# Patient Record
Sex: Female | Born: 1945 | Race: White | Hispanic: No | Smoking: Never smoker
Health system: Southern US, Community
[De-identification: ages and names within clinical notes are randomized; demographics above are authoritative.]

## PROBLEM LIST (undated history)

## (undated) DIAGNOSIS — F419 Anxiety disorder, unspecified: Secondary | ICD-10-CM

## (undated) DIAGNOSIS — D696 Thrombocytopenia, unspecified: Secondary | ICD-10-CM

## (undated) DIAGNOSIS — M8580 Other specified disorders of bone density and structure, unspecified site: Secondary | ICD-10-CM

## (undated) DIAGNOSIS — D649 Anemia, unspecified: Secondary | ICD-10-CM

## (undated) DIAGNOSIS — K219 Gastro-esophageal reflux disease without esophagitis: Secondary | ICD-10-CM

## (undated) DIAGNOSIS — N189 Chronic kidney disease, unspecified: Secondary | ICD-10-CM

## (undated) DIAGNOSIS — M1990 Unspecified osteoarthritis, unspecified site: Secondary | ICD-10-CM

## (undated) DIAGNOSIS — I10 Essential (primary) hypertension: Secondary | ICD-10-CM

## (undated) DIAGNOSIS — F429 Obsessive-compulsive disorder, unspecified: Secondary | ICD-10-CM

## (undated) DIAGNOSIS — F32A Depression, unspecified: Secondary | ICD-10-CM

## (undated) DIAGNOSIS — R413 Other amnesia: Secondary | ICD-10-CM

## (undated) DIAGNOSIS — E11319 Type 2 diabetes mellitus with unspecified diabetic retinopathy without macular edema: Secondary | ICD-10-CM

## (undated) DIAGNOSIS — F329 Major depressive disorder, single episode, unspecified: Secondary | ICD-10-CM

## (undated) HISTORY — DX: Thrombocytopenia, unspecified: D69.6

## (undated) HISTORY — DX: Major depressive disorder, single episode, unspecified: F32.9

## (undated) HISTORY — PX: BREAST EXCISIONAL BIOPSY: SUR124

## (undated) HISTORY — DX: Essential (primary) hypertension: I10

## (undated) HISTORY — DX: Other amnesia: R41.3

## (undated) HISTORY — PX: APPENDECTOMY: SHX54

## (undated) HISTORY — DX: Chronic kidney disease, unspecified: N18.9

## (undated) HISTORY — DX: Unspecified osteoarthritis, unspecified site: M19.90

## (undated) HISTORY — PX: CATARACT EXTRACTION: SUR2

## (undated) HISTORY — DX: Type 2 diabetes mellitus with unspecified diabetic retinopathy without macular edema: E11.319

## (undated) HISTORY — DX: Anxiety disorder, unspecified: F41.9

## (undated) HISTORY — DX: Anemia, unspecified: D64.9

## (undated) HISTORY — DX: Other specified disorders of bone density and structure, unspecified site: M85.80

## (undated) HISTORY — DX: Obsessive-compulsive disorder, unspecified: F42.9

## (undated) HISTORY — DX: Depression, unspecified: F32.A

## (undated) HISTORY — DX: Gastro-esophageal reflux disease without esophagitis: K21.9

---

## 1979-07-04 HISTORY — PX: TUBAL LIGATION: SHX77

## 1998-02-10 ENCOUNTER — Other Ambulatory Visit: Admission: RE | Admit: 1998-02-10 | Discharge: 1998-02-10 | Payer: Self-pay | Admitting: Gynecology

## 1998-12-21 ENCOUNTER — Ambulatory Visit (HOSPITAL_COMMUNITY): Admission: RE | Admit: 1998-12-21 | Discharge: 1998-12-21 | Payer: Self-pay | Admitting: Ophthalmology

## 1999-02-09 ENCOUNTER — Other Ambulatory Visit: Admission: RE | Admit: 1999-02-09 | Discharge: 1999-02-09 | Payer: Self-pay | Admitting: Gynecology

## 1999-08-12 ENCOUNTER — Emergency Department (HOSPITAL_COMMUNITY): Admission: EM | Admit: 1999-08-12 | Discharge: 1999-08-12 | Payer: Self-pay | Admitting: Emergency Medicine

## 2000-02-13 ENCOUNTER — Other Ambulatory Visit: Admission: RE | Admit: 2000-02-13 | Discharge: 2000-02-13 | Payer: Self-pay | Admitting: Gynecology

## 2000-04-10 ENCOUNTER — Encounter: Payer: Self-pay | Admitting: Gynecology

## 2000-04-10 ENCOUNTER — Encounter: Admission: RE | Admit: 2000-04-10 | Discharge: 2000-04-10 | Payer: Self-pay | Admitting: Gynecology

## 2001-02-14 ENCOUNTER — Other Ambulatory Visit: Admission: RE | Admit: 2001-02-14 | Discharge: 2001-02-14 | Payer: Self-pay | Admitting: Gynecology

## 2001-04-18 ENCOUNTER — Encounter: Admission: RE | Admit: 2001-04-18 | Discharge: 2001-04-18 | Payer: Self-pay | Admitting: Gynecology

## 2001-04-18 ENCOUNTER — Encounter: Payer: Self-pay | Admitting: Gynecology

## 2002-02-17 ENCOUNTER — Other Ambulatory Visit: Admission: RE | Admit: 2002-02-17 | Discharge: 2002-02-17 | Payer: Self-pay | Admitting: Gynecology

## 2002-06-02 ENCOUNTER — Encounter: Payer: Self-pay | Admitting: Gynecology

## 2002-06-02 ENCOUNTER — Encounter: Admission: RE | Admit: 2002-06-02 | Discharge: 2002-06-02 | Payer: Self-pay | Admitting: Gynecology

## 2003-02-19 ENCOUNTER — Other Ambulatory Visit: Admission: RE | Admit: 2003-02-19 | Discharge: 2003-02-19 | Payer: Self-pay | Admitting: Gynecology

## 2003-06-15 ENCOUNTER — Encounter: Admission: RE | Admit: 2003-06-15 | Discharge: 2003-06-15 | Payer: Self-pay | Admitting: Gynecology

## 2004-03-03 ENCOUNTER — Ambulatory Visit (HOSPITAL_COMMUNITY): Admission: RE | Admit: 2004-03-03 | Discharge: 2004-03-03 | Payer: Self-pay | Admitting: Endocrinology

## 2004-03-10 ENCOUNTER — Other Ambulatory Visit: Admission: RE | Admit: 2004-03-10 | Discharge: 2004-03-10 | Payer: Self-pay | Admitting: Gynecology

## 2004-06-20 ENCOUNTER — Encounter: Admission: RE | Admit: 2004-06-20 | Discharge: 2004-06-20 | Payer: Self-pay | Admitting: Gynecology

## 2004-07-03 HISTORY — PX: RETINAL DETACHMENT SURGERY: SHX105

## 2005-02-14 ENCOUNTER — Ambulatory Visit (HOSPITAL_COMMUNITY): Admission: RE | Admit: 2005-02-14 | Discharge: 2005-02-14 | Payer: Self-pay | Admitting: Ophthalmology

## 2005-03-27 ENCOUNTER — Other Ambulatory Visit: Admission: RE | Admit: 2005-03-27 | Discharge: 2005-03-27 | Payer: Self-pay | Admitting: Gynecology

## 2005-07-27 ENCOUNTER — Encounter: Admission: RE | Admit: 2005-07-27 | Discharge: 2005-07-27 | Payer: Self-pay | Admitting: Gynecology

## 2006-03-29 ENCOUNTER — Other Ambulatory Visit: Admission: RE | Admit: 2006-03-29 | Discharge: 2006-03-29 | Payer: Self-pay | Admitting: Gynecology

## 2006-07-30 ENCOUNTER — Encounter: Admission: RE | Admit: 2006-07-30 | Discharge: 2006-07-30 | Payer: Self-pay | Admitting: Gynecology

## 2007-07-11 ENCOUNTER — Encounter: Admission: RE | Admit: 2007-07-11 | Discharge: 2007-07-11 | Payer: Self-pay | Admitting: Gynecology

## 2008-07-06 ENCOUNTER — Encounter: Admission: RE | Admit: 2008-07-06 | Discharge: 2008-07-06 | Payer: Self-pay | Admitting: Gynecology

## 2009-08-05 ENCOUNTER — Encounter: Admission: RE | Admit: 2009-08-05 | Discharge: 2009-08-05 | Payer: Self-pay | Admitting: Gynecology

## 2010-01-11 ENCOUNTER — Encounter: Payer: Self-pay | Admitting: Gastroenterology

## 2010-02-22 ENCOUNTER — Encounter: Payer: Self-pay | Admitting: Gastroenterology

## 2010-03-09 ENCOUNTER — Encounter (INDEPENDENT_AMBULATORY_CARE_PROVIDER_SITE_OTHER): Payer: Self-pay | Admitting: *Deleted

## 2010-03-10 ENCOUNTER — Encounter (HOSPITAL_COMMUNITY)
Admission: RE | Admit: 2010-03-10 | Discharge: 2010-06-08 | Payer: Self-pay | Source: Home / Self Care | Admitting: Nephrology

## 2010-03-13 ENCOUNTER — Encounter (INDEPENDENT_AMBULATORY_CARE_PROVIDER_SITE_OTHER): Payer: Self-pay | Admitting: *Deleted

## 2010-03-15 ENCOUNTER — Encounter
Admission: RE | Admit: 2010-03-15 | Discharge: 2010-03-15 | Payer: Self-pay | Source: Home / Self Care | Admitting: Nephrology

## 2010-04-07 ENCOUNTER — Encounter (INDEPENDENT_AMBULATORY_CARE_PROVIDER_SITE_OTHER): Payer: Self-pay | Admitting: *Deleted

## 2010-04-14 ENCOUNTER — Encounter (INDEPENDENT_AMBULATORY_CARE_PROVIDER_SITE_OTHER): Payer: Self-pay | Admitting: *Deleted

## 2010-04-19 ENCOUNTER — Ambulatory Visit: Payer: Self-pay | Admitting: Gastroenterology

## 2010-04-19 DIAGNOSIS — D509 Iron deficiency anemia, unspecified: Secondary | ICD-10-CM

## 2010-04-19 DIAGNOSIS — IMO0001 Reserved for inherently not codable concepts without codable children: Secondary | ICD-10-CM | POA: Insufficient documentation

## 2010-04-19 DIAGNOSIS — N189 Chronic kidney disease, unspecified: Secondary | ICD-10-CM

## 2010-04-19 DIAGNOSIS — E109 Type 1 diabetes mellitus without complications: Secondary | ICD-10-CM

## 2010-05-24 ENCOUNTER — Ambulatory Visit: Payer: Self-pay | Admitting: Gastroenterology

## 2010-05-25 ENCOUNTER — Telehealth: Payer: Self-pay | Admitting: Gastroenterology

## 2010-05-30 ENCOUNTER — Encounter: Payer: Self-pay | Admitting: Gastroenterology

## 2010-06-03 ENCOUNTER — Encounter (INDEPENDENT_AMBULATORY_CARE_PROVIDER_SITE_OTHER): Payer: Self-pay | Admitting: *Deleted

## 2010-06-06 ENCOUNTER — Ambulatory Visit: Payer: Self-pay | Admitting: Gastroenterology

## 2010-06-10 ENCOUNTER — Ambulatory Visit: Payer: Self-pay | Admitting: Gastroenterology

## 2010-06-10 ENCOUNTER — Encounter: Payer: Self-pay | Admitting: Internal Medicine

## 2010-06-13 ENCOUNTER — Encounter: Payer: Self-pay | Admitting: Internal Medicine

## 2010-07-07 ENCOUNTER — Encounter (HOSPITAL_COMMUNITY)
Admission: RE | Admit: 2010-07-07 | Discharge: 2010-08-02 | Payer: Self-pay | Source: Home / Self Care | Attending: Nephrology | Admitting: Nephrology

## 2010-07-07 LAB — IRON AND TIBC
Iron: 123 ug/dL (ref 42–135)
Saturation Ratios: 44 % (ref 20–55)
TIBC: 280 ug/dL (ref 250–470)
UIBC: 157 ug/dL

## 2010-07-07 LAB — FERRITIN: Ferritin: 258 ng/mL (ref 10–291)

## 2010-07-08 LAB — POCT HEMOGLOBIN-HEMACUE: Hemoglobin: 12.8 g/dL (ref 12.0–15.0)

## 2010-07-23 ENCOUNTER — Other Ambulatory Visit: Payer: Self-pay | Admitting: Gynecology

## 2010-07-23 DIAGNOSIS — Z1239 Encounter for other screening for malignant neoplasm of breast: Secondary | ICD-10-CM

## 2010-07-25 LAB — POCT HEMOGLOBIN-HEMACUE: Hemoglobin: 11 g/dL — ABNORMAL LOW (ref 12.0–15.0)

## 2010-07-27 ENCOUNTER — Other Ambulatory Visit: Payer: Self-pay | Admitting: Gynecology

## 2010-07-27 DIAGNOSIS — R922 Inconclusive mammogram: Secondary | ICD-10-CM

## 2010-08-04 NOTE — Letter (Signed)
Summary: [facility] Instructions  [facility]  [place], [place] [address]   Phone: [phone]  Fax: [phone]       Jasmine Cummings    1946/03/03    MRN: [id number]        Procedure Day /Date:TUESDAY 05/24/2010     Arrival Time:1:30PM     Procedure Time:2:30PM     Location of Procedure:                    X  [facility] (4th Floor)  PREPARATION FOR COLONOSCOPY WITH MOVIPREP   Starting 5 days prior to your procedure 05/19/2010 do not eat nuts, seeds, popcorn, corn, beans, peas,  salads, or any raw vegetables.  Do not take any fiber supplements (e.g. Metamucil, Citrucel, and Benefiber).  THE DAY BEFORE YOUR PROCEDURE         DATE: 05/23/2010  DAY: MONDAY  1.  Drink clear liquids the entire day-NO SOLID FOOD  2.  Do not drink anything colored red or purple.  Avoid juices with pulp.  No orange juice.  3.  Drink at least 64 oz. (8 glasses) of fluid/clear liquids during the day to prevent dehydration and help the prep work efficiently.  CLEAR LIQUIDS INCLUDE: Water Jello Ice Popsicles Tea (sugar ok, no milk/cream) Powdered fruit flavored drinks Coffee (sugar ok, no milk/cream) Gatorade Juice: apple, white grape, white cranberry  Lemonade Clear bullion, consomm, broth Carbonated beverages (any kind) Strained chicken noodle soup Hard Candy                             4.  In the morning, mix first dose of MoviPrep solution:    Empty 1 Pouch A and 1 Pouch B into the disposable container    Add lukewarm drinking water to the top line of the container. Mix to dissolve    Refrigerate (mixed solution should be used within 24 hrs)  5.  Begin drinking the prep at 5:00 p.m. The MoviPrep container is divided by 4 marks.   Every 15 minutes drink the solution down to the next mark (approximately 8 oz) until the full liter is complete.   6.  Follow completed prep with 16 oz of clear liquid of your choice (Nothing red or purple).   Continue to drink clear liquids until bedtime.  7.  Before going to bed, mix second dose of MoviPrep solution:    Empty 1 Pouch A and 1 Pouch B into the disposable container    Add lukewarm drinking water to the top line of the container. Mix to dissolve    Refrigerate  THE DAY OF YOUR PROCEDURE      DATE:05/24/2010 DAY: TUESDAY  Beginning at 9:30a.m. (5 hours before procedure):         1. Every 15 minutes, drink the solution down to the next mark (approx 8 oz) until the full liter is complete.  2. Follow completed prep with 16 oz. of clear liquid of your choice.    3. You may drink clear liquids until 12:30PM (2 HOURS BEFORE PROCEDURE).   MEDICATION INSTRUCTIONS  Unless otherwise instructed, you should take regular prescription medications with a small sip of water   as early as possible the morning of your procedure.  Diabetic patients - see separate instructions.        OTHER INSTRUCTIONS  You will need a responsible adult at least 66 years of age  to accompany you and drive you home.   This person must remain in the waiting room during your procedure.  Wear loose fitting clothing that is easily removed.  Leave jewelry and other valuables at home.  However, you may wish to bring a book to read or  an iPod/MP3 player to listen to music as you wait for your procedure to start.  Remove all body piercing jewelry and leave at home.  Total time from sign-in until discharge is approximately 2-3 hours.  You should go home directly after your procedure and rest.  You can resume normal activities the  day after your procedure.  The day of your procedure you should not:   Drive   Make legal decisions   Operate machinery   Drink alcohol   Return to work  You will receive specific instructions about eating, activities and medications before you leave.    The above instructions have been reviewed and explained to me by   _______________________    I fully  understand and can verbalize these instructions _____________________________ Date _________

## 2010-08-04 NOTE — Progress Notes (Signed)
Summary: EGD  Phone Note Outgoing Call Call back at [facility] Phone [phone]   Call placed by: Christian Mate CMA Deborra Medina),  May 25, 2010 10:56 AM Summary of Call: Placed a call to the pt to schedule EGD and previsit,  left message on machine to call back  Initial call taken by: Christian Mate CMA Deborra Medina),  May 25, 2010 10:56 AM  Follow-up for Phone Call        Called pt once again to schedule the EGD, L/M for pt to call back and schedule..... Follow-up by: Genella Mech CMA Deborra Medina),  May 30, 2010 9:39 AM

## 2010-08-04 NOTE — Procedures (Signed)
Summary: Upper Endoscopy  Patient: Jasmine Cummings Note: All result statuses are Final unless otherwise noted.  Tests: (1) Upper Endoscopy (EGD)   EGD Upper Endoscopy       [facility] [address]     [place], [place]  [address]           ENDOSCOPY PROCEDURE REPORT           PATIENT:  [name], [name]  MR#:  [id number]     BIRTHDATE:  [birth year], 29 yrs. old  GENDER:  female           ENDOSCOPIST:  Docia Chuck. Geri Seminole, MD     Referred by:  Edrick Oh, M.D.           PROCEDURE DATE:  06/10/2010     PROCEDURE:  EGD with biopsy, [address]     ASA CLASS:  Class III     INDICATIONS:  iron deficiency anemia           MEDICATIONS:   Fentanyl 50 mcg IV, Versed 5 mg IV     TOPICAL ANESTHETIC:  Exactacain Spray           DESCRIPTION OF PROCEDURE:   After the risks benefits and     alternatives of the procedure were thoroughly explained, informed     consent was obtained.  The LB GIF-H180 [id number] endoscope was     introduced through the mouth and advanced to the third portion of     the duodenum, without limitations.  The instrument was slowly     withdrawn as the mucosa was fully examined.     <<PROCEDUREIMAGES>>           The upper, middle, and distal third of the esophagus were     carefully inspected and no abnormalities were noted. The z-line     was well seen at the GEJ. The endoscope was pushed into the fundus     which was normal including a retroflexed view. The antrum,gastric     body, first, second  and third part of the duodenum were     unremarkable.    Retroflexed views revealed no abnormalities.     Duodenal bx taken to r/o sprue.   The scope was then withdrawn     from the patient and the procedure completed.           COMPLICATIONS:  None           ENDOSCOPIC IMPRESSION:     1) Normal EGD           RECOMMENDATIONS:     1) Await biopsy results     2) If biopsies normal and iron deficiency recurs, recommend     hemoccults studies and consideration  of Capsule Endoscopy           ______________________________     Docia Chuck. Geri Seminole, MD           CC:  Erskine Emery, MD, Edrick Oh, MD, Reynold Bowen, MD, The     Patient           n.     eSIGNED:   Docia Chuck. Geri Seminole at 06/10/2010 08:52 AM           Chilton Si, [id number]  Note: An exclamation mark (!) indicates a result that was not dispersed into the flowsheet. Document Creation Date: 06/10/2010 8:52 AM _______________________________________________________________________  (1) Order result status:  Final Collection or observation date-time: 06/10/2010 08:40 Requested date-time:  Receipt date-time:  Reported date-time:  Referring Physician:   Ordering Physician: Lavena Bullion [phone]) Specimen Source:  Source: Tawanna Cooler Order Number: [id number] Lab site:

## 2010-08-04 NOTE — Procedures (Signed)
Summary: Colonoscopy  Patient: [name] Note: All result statuses are Final unless otherwise noted.  Tests: (1) Colonoscopy (COL)   COL Colonoscopy           [facility] [address]     [place], [place]  [address]           COLONOSCOPY PROCEDURE REPORT           PATIENT:  Jasmine Cummings, Jasmine Cummings  MR#:  [id number]     BIRTHDATE:  09-Oct-1945, 86 yrs. old  GENDER:  female           ENDOSCOPIST:  Sandy Salaam. Deatra Ina, MD     Referred by:           PROCEDURE DATE:  05/24/2010     PROCEDURE:  Diagnostic Colonoscopy     ASA CLASS:  Class II     INDICATIONS:  1) Iron deficiency anemia           MEDICATIONS:   Fentanyl 50 mcg IV, Versed 8 mg IV           DESCRIPTION OF PROCEDURE:   After the risks benefits and     alternatives of the procedure were thoroughly explained, informed     consent was obtained.  Digital rectal exam was performed and     revealed no abnormalities.   The LB 180AL [id number] endoscope was     introduced through the anus and advanced to the cecum, which was     identified by both the appendix and ileocecal valve, without     limitations.  The quality of the prep was excellent, using     MoviPrep.  The instrument was then slowly withdrawn as the colon     was fully examined.     <<PROCEDUREIMAGES>>           FINDINGS:  A normal appearing cecum, ileocecal valve, and     appendiceal orifice were identified. The ascending, hepatic     flexure, transverse, splenic flexure, descending, sigmoid colon,     and rectum appeared unremarkable (see image3, image4, image5,     image9, image10, and image11).   Retroflexed views in the rectum     revealed no abnormalities.    The time to cecum =  5.50  minutes.     The scope was then withdrawn (time =  6.25  min) from the patient     and the procedure completed.           COMPLICATIONS:  None           ENDOSCOPIC IMPRESSION:     1) Normal colon     RECOMMENDATIONS:     1) Upper endoscopy will be  scheduled           REPEAT EXAM:  No           ______________________________     Sandy Salaam. Deatra Ina, MD           CC: Edrick Oh, MD           n.     Lorrin Mais:   Sandy Salaam. Kaplan at 05/24/2010 03:15 PM           Seren, Winemiller Jane, [id number]  Note: An exclamation mark (!) indicates a result that was not dispersed into the flowsheet. Document Creation Date: 05/24/2010 3:15 PM _______________________________________________________________________  (1) Order result status: Final Collection or observation date-time: 05/24/2010 15:03 Requested date-time:  Receipt date-time:  Reported date-time:  Referring Physician:   Ordering Physician: Erskine Emery [phone]) Specimen Source:  Source: Tawanna Cooler Order Number: [id number] Lab site:   Appended Document: Colonoscopy    Clinical Lists Changes

## 2010-08-04 NOTE — Letter (Signed)
Summary: Consult/[facility]  Consult/[facility]   Imported By: Phillis Knack 03/23/2010 15:04:15  _____________________________________________________________________  External Attachment:    Type:   Image     Comment:   External Document

## 2010-08-04 NOTE — Letter (Signed)
Summary: Results Letter  [facility]  [place], [place] [address]   Phone: [phone]  Fax: [phone]        April 19, 2010 MRN: [id number]    Jasmine Cummings [address] [place], [place]  [address]    Dear Ms. GENT,  It is my pleasure to have treated you recently as a new patient in my office. I appreciate your confidence and the opportunity to participate in your care.  Since I do have a busy inpatient endoscopy schedule and office schedule, my office hours vary weekly. I am, however, available for emergency calls everyday through my office. If I am not available for an urgent office appointment, another one of our gastroenterologist will be able to assist you.  My well-trained staff are prepared to help you at all times. For emergencies after office hours, a physician from our Gastroenterology section is always available through my 24 hour answering service  Once again I welcome you as a new patient and I look forward to a happy and healthy relationship             Sincerely,  Inda Castle MD  This letter has been electronically signed by your physician.  Appended Document: Results Letter LETTER MAILED

## 2010-08-04 NOTE — Letter (Signed)
Summary: Diabetic Instructions  [facility]  [place], [place] [address]   Phone: [phone]  Fax: [phone]    SHAQUIRA GAFF 06-11-1946 MRN: [id number]   _  _   ORAL DIABETIC MEDICATION INSTRUCTIONS  The day before your procedure:   Take your diabetic pill as you do normally  The day of your procedure:   Do not take your diabetic pill    We will check your blood sugar levels during the admission process and again in Recovery before discharging you home  ________________________________________________________________________  _  _   INSULIN (LONG ACTING) MEDICATION INSTRUCTIONS (Lantus, NPH, 70/30, Humulin, Novolin-N)LEVEMIR FLEXPEN    The day before your procedure:   Take  your regular evening dose    The day of your procedure:   Do not take your morning dose    X   INSULIN (SHORT ACTING) MEDICATION INSTRUCTIONS (Regular, Humulog, Novolog)  The day before your procedure:   Do not take your evening dose   The day of your procedure:   Do not take your morning dose   _  _   INSULIN PUMP MEDICATION INSTRUCTIONS  We will contact the physician managing your diabetic care for written dosage instructions for the day before your procedure and the day of your procedure.  Once we have received the instructions, we will contact you.

## 2010-08-04 NOTE — Assessment & Plan Note (Signed)
Summary: IRON DEF. ANEMIA.Marland KitchenMarland KitchenEM   History of Present Illness Visit Type: Initial Consult Primary GI MD: Erskine Emery MD [facility] Primary [place]: Reynold Bowen, MD Requesting Lashika Erker: Edrick Oh, MD Chief Complaint: Patient here for further evaluation of iron deficiency anemia. She is not symptomatic at this time. History of Present Illness:   Mrs. Lapeyrouse is a pleasant 65 year old white female referred at the request of Dr. Justin Mend for evaluation of anemia.  She is an insulin-dependent diabetic with chronic renal insufficiency.  She apparently developed  anemia.  After receiving intravenous iron infusions her energy level has significantly improved.  Serum iron in early October, 2011 was 24 and TIBC 332 with a percent sat of 7.  She has had no overt bleeding including melena or hematochezia.  She has no history of ulcers and is on no gastric irritants.  She denies change in bowel habits or abdominal pain.   GI Review of Systems      Denies abdominal pain, acid reflux, belching, bloating, chest pain, dysphagia with liquids, dysphagia with solids, heartburn, loss of appetite, nausea, vomiting, vomiting blood, weight loss, and  weight gain.        Denies anal fissure, black tarry stools, change in bowel habit, constipation, diarrhea, diverticulosis, fecal incontinence, heme positive stool, hemorrhoids, irritable bowel syndrome, jaundice, light color stool, liver problems, rectal bleeding, and  rectal pain. Preventive Screening-Counseling & Management  Alcohol-Tobacco     Smoking Status: never      Drug Use:  no.      Current Medications (verified): 1)  Novolog Flexpen 100 Unit/ml Soln (Insulin Aspart) .... Use 5-6 Units Four Times Daily 2)  Toprol Xl 25 Mg Xr24h-Tab (Metoprolol Succinate) .... Take 1 Tablet By Mouth Once A Day 3)  Levemir Flexpen 100 Unit/ml Soln (Insulin Detemir) .... Use 7-8 Units Two Times A Day 4)  Triamterene-Hctz 75-50 Mg Tabs (Triamterene-Hctz) .... Take 1 Tablet By  Mouth Once A Day 5)  Amlodipine Besylate 5 Mg Tabs (Amlodipine Besylate) .... Take 1 Tablet By Mouth Once Daily 6)  Vytorin 10-20 Mg Tabs (Ezetimibe-Simvastatin) .... Take 1 Tablet By Mouth Once A Day  Allergies (verified): No Known Drug Allergies  Past History:  Past Medical History: Anxiety Depression Diabetes Bowel Obstruction Hypertension Kidney Failure  Past Surgical History: eye surgery-left eye  Family History: Family History of Breast Cancer: Sister No FH of Colon Cancer: Family History of Diabetes: Brother Family History of Heart Disease: Brother  Social History: Patient has never smoked.  Alcohol Use - no Daily Caffeine Use-2 drinks daily Illicit Drug Use - no Smoking Status:  never Drug Use:  no  Review of Systems       The patient complains of cough, depression-new, night sweats, skin rash, and sleeping problems.  The patient denies allergy/sinus, anemia, anxiety-new, back pain, blood in urine, breast changes/lumps, change in vision, confusion, coughing up blood, fainting, fatigue, fever, headaches-new, hearing problems, heart murmur, heart rhythm changes, itching, menstrual pain, muscle pains/cramps, nosebleeds, pregnancy symptoms, shortness of breath, sore throat, swelling of feet/legs, swollen lymph glands, thirst - excessive , urination - excessive , urination changes/pain, urine leakage, vision changes, and voice change.         All other systems were reviewed and were negative   Vital Signs:  Patient profile:   65 year old female Height:      65 inches Weight:      120 pounds BMI:     20.04 BSA:     1.59 Pulse rate:  76 / minute Pulse rhythm:   regular BP sitting:   146 / 66  (left arm)  Vitals Entered By: Madlyn Frankel CMA Deborra Medina) (April 19, 2010 3:01 PM)  Physical Exam  Additional Exam:  On physical exam she is a well-developed well-nourished female  skin: anicter  HEENT: normocephalic; PEERLA; no nasal or orpharyngeal  abnormalities neck: supple nodes: no cervical adenopathy chest: clear cor:  no murmurs, gallops or rubs abd:  bowel sounds normoactive; no abdominal masses, tenderness, organomegaly rectal: no masses; stool heme negative ext: no cyanosis, clubbing, or edema skeletal: no gross skeletal abnormalities neuro: alert, oriented x 3; no focal abnormalities    Impression & Recommendations:  Problem # 1:  IRON DEFICIENCY (ICD-280.9)  Presumably her iron deficiency is due to chronic GI blood loss.  Alternatively, she could be malabsorbing.  Sources of chronic blood loss should be ruled out including AVMs, neoplasms, active peptic disease and polyps.  Recommendations #1 colonoscopy #2 upper endoscopy if colonoscopy is negative #3 serial Hemoccult  Risks, alternatives, and complications of the procedure, including bleeding, perforation, and possible need for surgery, were explained to the patient.  Patient's questions were answered.  Orders: Colonoscopy (Colon)  Problem # 2:  IDDM (ICD-250.01) Assessment: Comment Only  Problem # 3:  RENAL FAILURE, CHRONIC (ICD-585.9) Assessment: Comment Only  Patient Instructions: 1)  Copy sent to : Reynold Bowen, MD Edrick Oh, MD 2)  Your Colonoscopy is scheduled on 05/24/2010 at 2:30pm 3)  Colonoscopy and Flexible Sigmoidoscopy brochure given.  4)  Conscious Sedation brochure given.  5)  The medication list was reviewed and reconciled.  All changed / newly prescribed medications were explained.  A complete medication list was provided to the patient / caregiver. Prescriptions: MOVIPREP 100 GM  SOLR (PEG-KCL-NACL-NASULF-NA ASC-C) As per prep instructions.  #1 x 0   Entered by:   Genella Mech CMA ([place])   Authorized by:   Inda Castle MD   Signed by:   Genella Mech CMA ([place]) on 04/19/2010   Method used:   Electronically to        [id number] (retail)       [place], [place]  [address]       Ph:  [phone]       Fax: [phone]   RxID:   [id number]

## 2010-08-04 NOTE — Letter (Signed)
Summary: EGD Instructions  [facility]  [address]   [place], [place] [address]   Phone: [phone]  Fax: [phone]       Jasmine Cummings    January 16, 1946    MRN: [id number]       Procedure Day /Date: Friday, 06-10-10     Arrival Time: 7:30 a.m.     Procedure Time: 8:00 a.m.     Location of Procedure:                    x  [facility] (4th Floor)    PREPARATION FOR ENDOSCOPY   On 06-10-10 Friday,  THE DAY OF THE PROCEDURE:  1.   No solid foods, milk or milk products are allowed after midnight the night before your procedure.  2.   Do not drink anything colored red or purple.  Avoid juices with pulp.  No orange juice.  3.  You may drink clear liquids until 6:00 a.m., which is 2 hours before your procedure.                                                                                                CLEAR LIQUIDS INCLUDE: Water Jello Ice Popsicles Tea (sugar ok, no milk/cream) Powdered fruit flavored drinks Coffee (sugar ok, no milk/cream) Gatorade Juice: apple, white grape, white cranberry  Lemonade Clear bullion, consomm, broth Carbonated beverages (any kind) Strained chicken noodle soup Hard Candy   MEDICATION INSTRUCTIONS  Unless otherwise instructed, you should take regular prescription medications with a small sip of water as early as possible the morning of your procedure.  Diabetic patients - see separate instructions.   Additional medication instructions: Hold Triamterene-HCTZ pill morning of procedure             OTHER INSTRUCTIONS  You will need a responsible adult at least 66 years of age to accompany you and drive you home.   This person must remain in the waiting room during your procedure.  Wear loose fitting clothing that is easily removed.  Leave jewelry and other valuables at home.  However, you may wish to bring a book to read or an iPod/MP3 player to listen to music as you wait for your procedure to  start.  Remove all body piercing jewelry and leave at home.  Total time from sign-in until discharge is approximately 2-3 hours.  You should go home directly after your procedure and rest.  You can resume normal activities the day after your procedure.  The day of your procedure you should not:   Drive   Make legal decisions   Operate machinery   Drink alcohol   Return to work  You will receive specific instructions about eating, activities and medications before you leave.    The above instructions have been reviewed and explained to me by   Sundra Aland RN  June 06, 2010 1:44 PM     I fully understand and can verbalize these instructions _____________________________ Date _________

## 2010-08-04 NOTE — Letter (Signed)
Summary: Letter to schedule EGD/Previsit  [facility]  [place], [place] [address]   Phone: [phone]  Fax: [phone]        May 30, 2010 MRN: [id number]    Jasmine Cummings [address] [place], [place]  [address]    Dear [name],   We have been unable to reach you by phone to schedule an  appointment for a EGD and a Pre-visit that was recommended for you by   Dr.Kaplan. It is very important that we reach you to schedule an   appointment. We hope that you  allow [place] to participate in your health care needs. Please contact us at  [phone] at your earliest convenience to schedule your appointment.     Sincerely,    Genella Mech CMA ([place])

## 2010-08-04 NOTE — Letter (Signed)
Summary: Diabetic Instructions  [facility]  [address]   [place], [place] [address]   Phone: [phone]  Fax: [phone]    BILLIEJEAN CREELY [birth year] MRN: [id number]     ________________________________________________________________________  _x  _   INSULIN (LONG ACTING) MEDICATION INSTRUCTIONS (Lantus, NPH, 70/30, Humulin, Novolin-N)   The day before your procedure:   Take  your regular evening dose    The day of your procedure:   Do not take your morning dose    x     INSULIN (SHORT ACTING) MEDICATION INSTRUCTIONS (Regular, Humulog, Novolog)   The day before your procedure:   Do not take your evening dose   The day of your procedure:   Do not take your morning dose

## 2010-08-04 NOTE — Letter (Signed)
Summary: New Patient letter  [facility]  [address] [place], [place] [address]   Phone: [phone]  Fax: [phone]       03/09/2010 MRN: [id number]  Jasmine Cummings [address] [place], [place]  [address]  Dear Ms. Jasmine Cummings,  Welcome to the Gastroenterology Division at [facility].    You are scheduled to see Dr.  Erskine Emery on April 19, 2010 at 3:45pm on the 3rd floor at [facility].  We ask that you try to arrive at our office 15 minutes prior to your appointment time to allow for check-in.  We would like you to complete the enclosed self-administered evaluation form prior to your visit and bring it with you on the day of your appointment.  We will review it with you.  Also, please bring a complete list of all your medications or, if you prefer, bring the medication bottles and we will list them.  Please bring your insurance card so that we may make a copy of it.  If your insurance requires a referral to see a specialist, please bring your referral form from your primary care physician.  Co-payments are due at the time of your visit and may be paid by cash, check or credit card.     Your office visit will consist of a consult with your physician (includes a physical exam), any laboratory testing he/she may order, scheduling of any necessary diagnostic testing (e.g. x-ray, ultrasound, CT-scan), and scheduling of a procedure (e.g. Endoscopy, Colonoscopy) if required.  Please allow enough time on your schedule to allow for any/all of these possibilities.    If you cannot keep your appointment, please call [phone] to cancel or reschedule prior to your appointment date.  This allows [place] the opportunity to schedule an appointment for another patient in need of care.  If you do not cancel or reschedule by 5 p.m. the business day prior to your appointment date, you will be charged a $50.00 late cancellation/no-show fee.    Thank you for  choosing [facility] for your medical needs.  We appreciate the opportunity to care for you.  Please visit [place] at our website  to learn more about our practice.                     Sincerely,                                                             The Gastroenterology Division

## 2010-08-04 NOTE — Letter (Signed)
Summary: Patient Notice-Endo Biopsy Results  [facility]  [place], [place] [address]   Phone: [phone]  Fax: [phone]        June 13, 2010 MRN: [id number]    Jasmine Cummings [address] [place], [place]  [address]    Dear [name],  I am pleased to inform you that the biopsies taken during your recent endoscopic examination were normal and did not show any evidence of and iron absorption problem.   Additional information/recommendations:  __No further action is needed at this time.  Please follow-up with      your primary care physician 2 monitor your blood counts and for your other healthcare needs.  --Routine GI followup with Dr. Deatra Ina as needed     Please call us if you are having persistent problems or have questions about your condition that have not been fully answered at this time.  Sincerely,  Irene Shipper MD  This letter has been electronically signed by your physician.  Appended Document: Patient Notice-Endo Biopsy Results Letter mailed

## 2010-08-04 NOTE — Miscellaneous (Signed)
Summary: egd for 06/10/10/previsit prep  Clinical Lists Changes  Allergies: Added new allergy or adverse reaction of * LATEX Observations: Added new observation of NKA: F (06/06/2010 13:19) Added new observation of ALLERGY REV: Done (06/06/2010 13:19)

## 2010-08-08 ENCOUNTER — Ambulatory Visit
Admission: RE | Admit: 2010-08-08 | Discharge: 2010-08-08 | Disposition: A | Discharge status: Home / Self Care | Payer: PRIVATE HEALTH INSURANCE | Source: Ambulatory Visit | Attending: Gynecology | Admitting: Gynecology

## 2010-08-08 ENCOUNTER — Ambulatory Visit: Payer: Self-pay

## 2010-08-08 DIAGNOSIS — R922 Inconclusive mammogram: Secondary | ICD-10-CM

## 2010-08-18 ENCOUNTER — Other Ambulatory Visit: Payer: Self-pay | Admitting: Nephrology

## 2010-08-18 ENCOUNTER — Other Ambulatory Visit: Payer: Self-pay

## 2010-08-18 ENCOUNTER — Encounter (HOSPITAL_COMMUNITY)
Admission: RE | Admit: 2010-08-18 | Discharge: 2010-08-18 | Disposition: A | Discharge status: Home / Self Care | Payer: Medicare Other | Source: Ambulatory Visit | Attending: Nephrology | Admitting: Nephrology

## 2010-08-18 DIAGNOSIS — D638 Anemia in other chronic diseases classified elsewhere: Secondary | ICD-10-CM | POA: Insufficient documentation

## 2010-08-18 DIAGNOSIS — N183 Chronic kidney disease, stage 3 unspecified: Secondary | ICD-10-CM | POA: Insufficient documentation

## 2010-08-18 LAB — RENAL FUNCTION PANEL
Albumin: 3.2 g/dL — ABNORMAL LOW (ref 3.5–5.2)
BUN: 55 mg/dL — ABNORMAL HIGH (ref 6–23)
CO2: 29 mEq/L (ref 19–32)
Calcium: 8.8 mg/dL (ref 8.4–10.5)
Chloride: 98 mEq/L (ref 96–112)
Creatinine, Ser: 3.54 mg/dL — ABNORMAL HIGH (ref 0.4–1.2)
GFR calc Af Amer: 16 mL/min — ABNORMAL LOW (ref >60)
GFR calc non Af Amer: 13 mL/min — ABNORMAL LOW (ref >60)
Glucose, Bld: 257 mg/dL — ABNORMAL HIGH (ref 70–99)
Phosphorus: 4.6 mg/dL (ref 2.3–4.6)
Potassium: 4.5 mEq/L (ref 3.5–5.1)
Sodium: 133 mEq/L — ABNORMAL LOW (ref 135–145)

## 2010-08-18 LAB — IRON AND TIBC
Iron: 111 ug/dL (ref 42–135)
Saturation Ratios: 41 % (ref 20–55)
TIBC: 270 ug/dL (ref 250–470)
UIBC: 159 ug/dL

## 2010-08-18 LAB — FERRITIN: Ferritin: 279 ng/mL (ref 10–291)

## 2010-08-19 LAB — POCT HEMOGLOBIN-HEMACUE: Hemoglobin: 11.4 g/dL — ABNORMAL LOW (ref 12.0–15.0)

## 2010-09-13 LAB — GLUCOSE, CAPILLARY
Glucose-Capillary: 195 mg/dL — ABNORMAL HIGH (ref 70–99)
Glucose-Capillary: 318 mg/dL — ABNORMAL HIGH (ref 70–99)
Glucose-Capillary: 214 mg/dL — ABNORMAL HIGH (ref 70–99)

## 2010-09-14 ENCOUNTER — Encounter (HOSPITAL_COMMUNITY): Payer: PRIVATE HEALTH INSURANCE

## 2010-09-14 LAB — IRON AND TIBC
Saturation Ratios: 46 % (ref 20–55)
TIBC: 310 ug/dL (ref 250–470)
UIBC: 167 ug/dL

## 2010-09-14 LAB — FERRITIN: Ferritin: 446 ng/mL — ABNORMAL HIGH (ref 10–291)

## 2010-09-14 LAB — POCT HEMOGLOBIN-HEMACUE
Hemoglobin: 12.8 g/dL (ref 12.0–15.0)
Hemoglobin: 13.4 g/dL (ref 12.0–15.0)
Hemoglobin: 13.6 g/dL (ref 12.0–15.0)

## 2010-09-15 ENCOUNTER — Other Ambulatory Visit: Payer: Self-pay

## 2010-09-15 ENCOUNTER — Other Ambulatory Visit: Payer: Self-pay | Admitting: Nephrology

## 2010-09-15 ENCOUNTER — Encounter (HOSPITAL_COMMUNITY): Payer: Medicare Other | Attending: Nephrology

## 2010-09-15 ENCOUNTER — Encounter (HOSPITAL_COMMUNITY): Payer: PRIVATE HEALTH INSURANCE

## 2010-09-15 DIAGNOSIS — N183 Chronic kidney disease, stage 3 unspecified: Secondary | ICD-10-CM | POA: Insufficient documentation

## 2010-09-15 DIAGNOSIS — D638 Anemia in other chronic diseases classified elsewhere: Secondary | ICD-10-CM | POA: Insufficient documentation

## 2010-09-15 LAB — POCT HEMOGLOBIN-HEMACUE
Hemoglobin: 11.4 g/dL — ABNORMAL LOW (ref 12.0–15.0)
Hemoglobin: 11.8 g/dL — ABNORMAL LOW (ref 12.0–15.0)
Hemoglobin: 10.8 g/dL — ABNORMAL LOW (ref 12.0–15.0)
Hemoglobin: 10.4 g/dL — ABNORMAL LOW (ref 12.0–15.0)
Hemoglobin: 11 g/dL — ABNORMAL LOW (ref 12.0–15.0)
Hemoglobin: 11.3 g/dL — ABNORMAL LOW (ref 12.0–15.0)
Hemoglobin: 10 g/dL — ABNORMAL LOW (ref 12.0–15.0)

## 2010-09-15 LAB — RENAL FUNCTION PANEL
Albumin: 3.4 g/dL — ABNORMAL LOW (ref 3.5–5.2)
BUN: 77 mg/dL — ABNORMAL HIGH (ref 6–23)
Chloride: 97 mEq/L (ref 96–112)
GFR calc Af Amer: 15 mL/min — ABNORMAL LOW (ref >60)
GFR calc non Af Amer: 12 mL/min — ABNORMAL LOW (ref >60)
Phosphorus: 5.3 mg/dL — ABNORMAL HIGH (ref 2.3–4.6)
Potassium: 4.1 mEq/L (ref 3.5–5.1)
Sodium: 131 mEq/L — ABNORMAL LOW (ref 135–145)

## 2010-09-15 LAB — IRON AND TIBC: TIBC: 332 ug/dL (ref 250–470)

## 2010-09-15 LAB — FERRITIN: Ferritin: 21 ng/mL (ref 10–291)

## 2010-10-13 ENCOUNTER — Other Ambulatory Visit: Payer: Self-pay | Admitting: Nephrology

## 2010-10-13 ENCOUNTER — Encounter (HOSPITAL_COMMUNITY): Payer: Medicare Other | Attending: Nephrology

## 2010-10-13 DIAGNOSIS — D638 Anemia in other chronic diseases classified elsewhere: Secondary | ICD-10-CM | POA: Insufficient documentation

## 2010-10-13 DIAGNOSIS — N183 Chronic kidney disease, stage 3 unspecified: Secondary | ICD-10-CM | POA: Insufficient documentation

## 2010-10-13 LAB — IRON AND TIBC
Saturation Ratios: 40 % (ref 20–55)
TIBC: 268 ug/dL (ref 250–470)

## 2010-10-13 LAB — FERRITIN: Ferritin: 160 ng/mL (ref 10–291)

## 2010-10-27 ENCOUNTER — Other Ambulatory Visit: Payer: Self-pay | Admitting: Nephrology

## 2010-10-27 ENCOUNTER — Encounter (HOSPITAL_COMMUNITY): Payer: Medicare Other

## 2010-11-18 NOTE — Op Note (Signed)
Jasmine Cummings, KUKLINSKI NO.:  [id number]   MEDICAL RECORD NO.:  [id number]          PATIENT TYPE:  AMB   LOCATION:  SDS                          FACILITY:  [facility]   PHYSICIAN:  Garey Ham, M.D.DATE OF BIRTH:  09/05/1945   DATE OF PROCEDURE:  02/14/2005  DATE OF DISCHARGE:                                 OPERATIVE REPORT   PREOPERATIVE DIAGNOSIS:  Senile nuclear cataract, left eye; diabetic  retinopathy, left eye.   POSTOPERATIVE DIAGNOSIS:  Senile nuclear cataract, left eye; diabetic  retinopathy, left eye.   DATE OF OPERATION:  Planned extracapsular cataract extraction -  phacoemulsification, primary insertion of posterior chamber intraocular lens  implant.   SURGEON:  Jacklin.   ASSISTANT:  Nurse.   ANESTHESIA:  Local 4% Xylocaine, 0.75 Marcaine retrobulbar block with Wydase  added, topical tetracaine, intraocular Xylocaine, and anesthesia standby  required in this diabetic patient.   OPERATIVE PROCEDURE:  After the patient was prepped and draped, a lid  speculum was inserted in the left eye. The eye was turned downward and a  superior rectus traction suture placed.  Schiotz tonometry was recorded at  six scale units with a 5.5 grams weight. A peritomy was performed adjacent  to the limbus from the 11 to the 1 o'clock position. The corneoscleral  junction was cleaned and a corneoscleral groove made with a 45 degrees  Superblade.  The anterior chamber was then entered with a 2.5 mm diamond  keratome at the 12 o'clock position and the 15 degrees blade at the 2:30  position.  Using a bent 26 gauge needle on an OcuCoat syringe, a circular  capsulorrhexis was begun and then completed with the Utrata forceps.  Hydrodissection and hydrodelineation were performed using 1% Xylocaine. The  30 degrees phacoemulsification tip was then inserted with slow controlled  emulsification of the lens nucleus with back cracking and fragmentation with  the Bechert pick.  Total ultrasonic time 59 seconds, average power level 11%,  total amount of fluid used 50 mL. Following removal of the nucleus, the  residual cortex was aspirated with the silicone tipped irrigation-aspiration  probe. The posterior capsule appeared intact with a brilliant red fundus  reflex.  Was therefore elected to insert an [employer] [id number]  silicone three-piece posterior chamber intraocular lens implant, diopter  strength +22.00. This was inserted with the McDonald forceps into the  anterior chamber and then centered into the capsular bag using the [facility]  lens rotator. The lens appeared to be well-centered. The OcuCoat and Provisc  which had been used intermittently during the procedures were aspirated and  replaced with balanced salt solution and Miochol ophthalmic solution. The  operative incisions appeared to be self-sealing. It was elected, however, to  place a single 10-0 interrupted nylon suture across the longer 12 o'clock  incision  to ensure closure and to prevent endophthalmitis. Maxitrol ointment was  instilled in the conjunctival cul-de-sac and a light patch protector shield  applied. Duration of procedure and anesthesia administration 45 minutes. The  patient tolerated the procedure well in general, left the operating room for  the recovery room in good condition.           ______________________________  Garey Ham, M.D.     HNJ/MEDQ  D:  02/14/2005  T:  02/14/2005  Job:  [id number]

## 2010-11-18 NOTE — H&P (Signed)
Jasmine Cummings, Jasmine Cummings NO.:  [id number]   MEDICAL RECORD NO.:  [id number]          PATIENT TYPE:  AMB   LOCATION:  SDS                          FACILITY:  [facility]   PHYSICIAN:  Garey Ham, M.D.DATE OF BIRTH:  [birth year]   DATE OF ADMISSION:  02/14/2005  DATE OF DISCHARGE:                                HISTORY & PHYSICAL   REASON FOR ADMISSION:  This was a planned outpatient readmission of this 65-  year-old white female, insulin-dependent diabetic, admitted for  cataract/implant surgery of the left eye.   PRESENT ILLNESS:  This patient has a long history of insulin-dependent  diabetes mellitus and secondary complications of diabetic retinopathy and  cataract formation.  he has undergone previous panretinal laser  photocoagulation to both eyes and has had a pars plana vitrectomy of the  right eye with endolaser photocoagulation in 1998.  More recently, the  patient has had cataract/implant surgery of the right eye on December 21, 1998.  The unoperated left eye has gradually deteriorated to 20/60 visual acuity  and the patient has now requested similar cataract/implant surgery.  She was  given oral discussion and printed information concerning the procedure and  its possible complications; she signed an informed consent and arrangements  were made for her outpatient admission.   PAST MEDICAL HISTORY:  The patient continues under the care of Dr. Forde Dandy  She is felt to be in satisfactory condition for the proposed surgery under  local anesthesia.   REVIEW OF SYSTEMS:  No cardiorespiratory complaints.   PHYSICAL EXAMINATION:  VITAL SIGNS:  As recorded on admission, blood  pressure 142/70, pulse 56, respirations 16, temperature 98.6.  GENERAL APPEARANCE:  The patient is a pleasant, well-nourished, well-  developed 65 year old white female in no acute distress.  HEENT:  EYES:  Visual acuity recorded at 20/30, right eye, 20/60-, left eye  with correction.   Applanation tonometry normal.  Slitlamp examination:  The  eyes are white and clear with clear corneae, deep and clear anterior  chambers.  A posterior chamber intraocular lens implant is present in the  right eye with the posterior capsule open.  The left eye shows nuclear  cataract formation.  Dilated fundus examination reveals a clear vitreous,  attached retina with extensive panretinal laser photocoagulation.  No active  bleeding is present.  CHEST/LUNGS:  Clear to percussion and auscultation.  HEART:  Normal sinus rhythm, no cardiomegaly, no murmurs.  ABDOMEN:  Negative.  EXTREMITIES:  Negative.   ADMISSION DIAGNOSES:  1.  Senile nuclear cataract, left eye.  2.  Pseudophakia, right eye.  3.  Insulin-dependent diabetes mellitus.  4.  Proliferative diabetic retinopathy.   SURGICAL PLAN:  Cataract/implant surgery, left eye.           ______________________________  Garey Ham, M.D.     HNJ/MEDQ  D:  02/14/2005  T:  02/14/2005  Job:  [id number]   cc:   Annie Main A. Forde Dandy, M.D.  [address]  [place]  [place] [address]  Fax: [phone]

## 2010-11-24 ENCOUNTER — Other Ambulatory Visit: Payer: Self-pay | Admitting: Nephrology

## 2010-11-24 ENCOUNTER — Encounter (HOSPITAL_COMMUNITY): Payer: Medicare Other | Attending: Nephrology

## 2010-11-24 DIAGNOSIS — N183 Chronic kidney disease, stage 3 unspecified: Secondary | ICD-10-CM | POA: Insufficient documentation

## 2010-11-24 DIAGNOSIS — D638 Anemia in other chronic diseases classified elsewhere: Secondary | ICD-10-CM | POA: Insufficient documentation

## 2010-11-24 LAB — IRON AND TIBC
Saturation Ratios: 32 % (ref 20–55)
TIBC: 279 ug/dL (ref 250–470)
UIBC: 191 ug/dL

## 2010-12-22 ENCOUNTER — Other Ambulatory Visit: Payer: Self-pay | Admitting: Nephrology

## 2010-12-22 ENCOUNTER — Encounter (HOSPITAL_COMMUNITY): Payer: Medicare Other | Attending: Nephrology

## 2010-12-22 DIAGNOSIS — D638 Anemia in other chronic diseases classified elsewhere: Secondary | ICD-10-CM | POA: Insufficient documentation

## 2010-12-22 DIAGNOSIS — N183 Chronic kidney disease, stage 3 unspecified: Secondary | ICD-10-CM | POA: Insufficient documentation

## 2010-12-22 LAB — FERRITIN: Ferritin: 170 ng/mL (ref 10–291)

## 2010-12-22 LAB — IRON AND TIBC
Iron: 95 ug/dL (ref 42–135)
UIBC: 178 ug/dL

## 2010-12-22 LAB — POCT HEMOGLOBIN-HEMACUE: Hemoglobin: 10.6 g/dL — ABNORMAL LOW (ref 12.0–15.0)

## 2011-01-19 ENCOUNTER — Other Ambulatory Visit: Payer: Self-pay | Admitting: Nephrology

## 2011-01-19 ENCOUNTER — Encounter (HOSPITAL_COMMUNITY): Payer: Medicare Other | Attending: Nephrology

## 2011-01-19 DIAGNOSIS — D638 Anemia in other chronic diseases classified elsewhere: Secondary | ICD-10-CM | POA: Insufficient documentation

## 2011-01-19 DIAGNOSIS — N183 Chronic kidney disease, stage 3 unspecified: Secondary | ICD-10-CM | POA: Insufficient documentation

## 2011-01-19 LAB — IRON AND TIBC
Iron: 81 ug/dL (ref 42–135)
TIBC: 272 ug/dL (ref 250–470)
UIBC: 191 ug/dL

## 2011-01-19 LAB — FERRITIN: Ferritin: 181 ng/mL (ref 10–291)

## 2011-02-16 ENCOUNTER — Other Ambulatory Visit: Payer: Self-pay | Admitting: Nephrology

## 2011-02-16 ENCOUNTER — Encounter (HOSPITAL_COMMUNITY): Payer: Medicare Other | Attending: Nephrology

## 2011-02-16 DIAGNOSIS — N183 Chronic kidney disease, stage 3 unspecified: Secondary | ICD-10-CM | POA: Insufficient documentation

## 2011-02-16 DIAGNOSIS — D638 Anemia in other chronic diseases classified elsewhere: Secondary | ICD-10-CM | POA: Insufficient documentation

## 2011-02-16 LAB — IRON AND TIBC
Saturation Ratios: 39 % (ref 20–55)
UIBC: 161 ug/dL

## 2011-02-16 LAB — POCT HEMOGLOBIN-HEMACUE: Hemoglobin: 10.3 g/dL — ABNORMAL LOW (ref 12.0–15.0)

## 2011-03-16 ENCOUNTER — Encounter (HOSPITAL_COMMUNITY)
Admission: RE | Admit: 2011-03-16 | Discharge: 2011-03-16 | Disposition: A | Discharge status: Home / Self Care | Payer: Medicare Other | Source: Ambulatory Visit | Attending: Nephrology | Admitting: Nephrology

## 2011-03-16 ENCOUNTER — Other Ambulatory Visit: Payer: Self-pay | Admitting: Nephrology

## 2011-03-16 DIAGNOSIS — N183 Chronic kidney disease, stage 3 unspecified: Secondary | ICD-10-CM | POA: Insufficient documentation

## 2011-03-16 DIAGNOSIS — D638 Anemia in other chronic diseases classified elsewhere: Secondary | ICD-10-CM | POA: Insufficient documentation

## 2011-03-16 LAB — POCT HEMOGLOBIN-HEMACUE: Hemoglobin: 12.2 g/dL (ref 12.0–15.0)

## 2011-03-23 ENCOUNTER — Other Ambulatory Visit: Payer: Self-pay

## 2011-03-23 DIAGNOSIS — Z01810 Encounter for preprocedural cardiovascular examination: Secondary | ICD-10-CM

## 2011-03-23 DIAGNOSIS — N186 End stage renal disease: Secondary | ICD-10-CM

## 2011-03-30 ENCOUNTER — Encounter (HOSPITAL_COMMUNITY)
Admission: RE | Admit: 2011-03-30 | Discharge: 2011-03-30 | Payer: Medicare Other | Source: Ambulatory Visit | Attending: Nephrology | Admitting: Nephrology

## 2011-03-30 ENCOUNTER — Other Ambulatory Visit: Payer: Self-pay | Admitting: Nephrology

## 2011-03-30 LAB — POCT HEMOGLOBIN-HEMACUE: Hemoglobin: 11.1 g/dL — ABNORMAL LOW (ref 12.0–15.0)

## 2011-04-05 ENCOUNTER — Encounter: Payer: Self-pay | Admitting: Vascular Surgery

## 2011-04-11 ENCOUNTER — Encounter: Payer: Self-pay | Admitting: Vascular Surgery

## 2011-04-12 ENCOUNTER — Other Ambulatory Visit: Payer: Medicare Other

## 2011-04-12 ENCOUNTER — Ambulatory Visit: Payer: Medicare Other | Admitting: Vascular Surgery

## 2011-04-27 ENCOUNTER — Other Ambulatory Visit: Payer: Self-pay | Admitting: Nephrology

## 2011-04-27 ENCOUNTER — Encounter (HOSPITAL_COMMUNITY): Payer: Medicare Other | Attending: Nephrology

## 2011-04-27 DIAGNOSIS — D638 Anemia in other chronic diseases classified elsewhere: Secondary | ICD-10-CM | POA: Insufficient documentation

## 2011-04-27 DIAGNOSIS — N183 Chronic kidney disease, stage 3 unspecified: Secondary | ICD-10-CM | POA: Insufficient documentation

## 2011-04-27 LAB — IRON AND TIBC
Saturation Ratios: 38 % (ref 20–55)
TIBC: 285 ug/dL (ref 250–470)

## 2011-05-04 ENCOUNTER — Encounter: Payer: Self-pay | Admitting: Vascular Surgery

## 2011-05-05 ENCOUNTER — Ambulatory Visit (INDEPENDENT_AMBULATORY_CARE_PROVIDER_SITE_OTHER): Payer: Medicare Other | Admitting: Vascular Surgery

## 2011-05-05 ENCOUNTER — Encounter: Payer: Self-pay | Admitting: Vascular Surgery

## 2011-05-05 ENCOUNTER — Other Ambulatory Visit (INDEPENDENT_AMBULATORY_CARE_PROVIDER_SITE_OTHER): Payer: Medicare Other | Admitting: *Deleted

## 2011-05-05 DIAGNOSIS — N184 Chronic kidney disease, stage 4 (severe): Secondary | ICD-10-CM

## 2011-05-05 DIAGNOSIS — E119 Type 2 diabetes mellitus without complications: Secondary | ICD-10-CM | POA: Insufficient documentation

## 2011-05-05 DIAGNOSIS — N179 Acute kidney failure, unspecified: Secondary | ICD-10-CM | POA: Insufficient documentation

## 2011-05-05 DIAGNOSIS — Z01810 Encounter for preprocedural cardiovascular examination: Secondary | ICD-10-CM

## 2011-05-05 DIAGNOSIS — N19 Unspecified kidney failure: Secondary | ICD-10-CM | POA: Insufficient documentation

## 2011-05-05 NOTE — Progress Notes (Signed)
[facility]  Referred by:  Sherril Croon, MD [address] [place], [place] [address]  Reason for referral: New access  History of Present Illness  Jasmine Cummings is a 65 y.o. female who presents for evaluation for permanent access.  The patient is right hand dominant.  The patient has not had previous access procedures.  Previous central venous cannulation procedures include: none.  The patient has never had a PPM placed.  The patient is on the renal transplant list and has already been notified she is on stand by list for a kidney soon.  Past Medical History  Diagnosis Date  . Diabetes mellitus   . Chronic kidney disease   . Anemia     Past Surgical History  Procedure Date  . Appendectomy   . Eye surgery 2006    Left eye diabetic retina detachment    History   Social History  . Marital Status: Married    Spouse Name: N/A    Number of Children: N/A  . Years of Education: N/A   Occupational History  . Not on file.   Social History Main Topics  . Smoking status: Never Smoker   . Smokeless tobacco: Not on file  . Alcohol Use: No  . Drug Use: No  . Sexually Active: Not on file   Other Topics Concern  . Not on file   Social History Narrative  . No narrative on file    Family History  Problem Relation Age of Onset  . Heart disease Sister   . Heart disease Brother     Current Outpatient Prescriptions on File Prior to Visit  Medication Sig Dispense Refill  . amLODipine (NORVASC) 5 MG tablet Take 10 mg by mouth daily.       Marland Kitchen b complex vitamins capsule Take 1 capsule by mouth daily.        . darbepoetin (ARANESP) 60 MCG/0.3ML SOLN Inject 60 mcg into the skin every 30 (thirty) days.        Marland Kitchen ezetimibe-simvastatin (VYTORIN) 10-20 MG per tablet Take 1 tablet by mouth at bedtime.        Marland Kitchen FLUoxetine (PROZAC) 40 MG capsule Take 40 mg by mouth daily.        . insulin aspart (NOVOLOG) 100 UNIT/ML injection Inject 8 Units into the skin 2  (two) times daily.        . insulin detemir (LEVEMIR) 100 UNIT/ML injection Inject into the skin at bedtime.        . metoprolol succinate (TOPROL-XL) 25 MG 24 hr tablet Take 25 mg by mouth daily.        . Multiple Vitamin (MULTIVITAMIN) capsule Take 1 capsule by mouth daily.        Marland Kitchen CALCIUM PO Take by mouth.        . furosemide (LASIX) 20 MG tablet Take 20 mg by mouth daily.          Allergies as of 05/05/2011 - Review Complete 05/05/2011  Allergen Reaction Noted  . Latex Hives 06/06/2010    Review of Systems (Positive items checked otherwise negative)  General: [x]  Weight loss, [ ]  Weight gain, [ ]   Loss of appetite, [ ]  Fever  Neurologic: [ ]  Dizziness, [ ]  Blackouts, [ ]  Headaches, [ ]  Seizure  Ear/Nose/Throat: [ ]  Change in eyesight, [ ]  Change in hearing, [ ]  Nose bleeds, [ ]  Sore throat  Vascular: [ ]  Pain in legs with walking, [ ]   Pain in feet while lying flat, [ ]  Non-healing ulcer, Stroke, [ ]  "Mini stroke", [ ]  Slurred speech, [ ]  Temporary blindness, [ ]  Blood clot in vein, [ ]  Phlebitis  Pulmonary: [ ]  Home oxygen, [ ]  Productive cough, [ ]  Bronchitis, [ ]  Coughing up blood,  [ ]  Asthma, [ ]  Wheezing  Musculoskeletal: [x]  Arthritis, [x]  Joint pain, [ ]  Muscle pain  Cardiac: [ ]  Chest pain, [ ]  Chest tightness/pressure, [ ]  Shortness of breath when lying flat, [ ]  Shortness of breath with exertion, [ ]  Palpitations, [ ]  Heart murmur, [ ]  Arrythmia,  [ ]  Atrial fibrillation  Hematologic: [ ]  Bleeding problems, [ ]  Clotting disorder, [x]  Anemia  Psychiatric:  [x]  Depression, [x]  Anxiety, [ ]  Attention deficit disorder  Gastrointestinal:  [ ]  Black stool,[ ]   Blood in stool, [ ]  Peptic ulcer disease, [ ]  Reflux, [ ]  Hiatal hernia, [ ]  Trouble swallowing, [ ]  Diarrhea, [ ]  Constipation  Urinary:  [x]  Kidney disease, [ ]  Burning with urination, [ ]  Frequent urination, [ ]  Difficulty urinating  Skin: [ ]  Ulcers, [ ]  Rashes   Physical Examination  Filed Vitals:    05/05/11 0958  BP: 187/83  Pulse: 68  Height: 5\' 6"  (1.676 m)  Weight: 118 lb (53.524 kg)  SpO2: 100%    General: A&O x 3, WDWN, thin  Head: [place]/AT  Ear/Nose/Throat: Hearing grossly intact, nares w/o erythema or drainage, oropharynx w/o Erythema/Exudate  Eyes: PERRLA, EOMI  Neck: Supple, no nuchal rigidity, no palpable LAD  Pulmonary: Sym exp, good air movt, CTAB, no rales, rhonchi, & wheezing  Cardiac: RRR, Nl S1, S2, no Murmurs, rubs or gallops  Vascular: Vessel Right Left  Radial Palpable Palpable  Brachial Palpable Palpable  Carotid Palpable, without bruit Palpable, without bruit  Aorta Non-palpable N/A  Femoral Palpable Palpable  Popliteal Non-palpable Non-palpable  PT Palpable Palpable  DP Palpable Palpable   Gastrointestinal: soft, NTND, -G/R, - HSM, - masses, - CVAT B  Musculoskeletal: M/S 5/5 throughout , Extremities without ischemic changes   Neurologic: CN 2-12 intact , Pain and light touch intact in extremities , Motor exam as listed above  Psychiatric: Judgment intact, Mood & affect appropriate for pt's clinical situation  Dermatologic: See M/S exam for extremity exam, no rashes otherwise noted  Lymph : No Cervical, Axillary, or Inguinal lymphadenopathy   Non-Invasive Vascular Imaging  Vein Mapping  (Date: 05/05/11):   R arm: acceptable vein conduits include none  L arm: acceptable vein conduits include none  Outside Studies/Documentation 5 pages of outside documents were reviewed including: nephrology clinic notes.  Medical Decision Making  Jasmine Cummings is a 65 y.o. female who presents with CKD IV-V  Based on vein mapping and examination, this patient's permanent access options include: L FA or UA AVG  I had an extensive discussion with this patient in regards to the nature of access surgery, including risk, benefits, and alternatives.    The patient is aware that the risks of access surgery include but are not limited to: bleeding,  infection, steal syndrome, nerve damage, ischemic monomelic neuropathy, failure of access to mature, and possible need for additional access procedures in the future.  Luckily the patient may soon receive a kidney transplant, so immediate permanent access is not scheduled at this time.  We are available to help with this patient's care as needed.  Adele Barthel, MD [facility] Office: [phone] Pager: [phone]

## 2011-05-11 NOTE — Procedures (Unsigned)
CEPHALIC VEIN MAPPING  INDICATION:  Preoperative vein mapping for AVF placement.  HISTORY: Chronic kidney disease, stage IV.  EXAM: The right cephalic vein is compressible.  Diameter measurements range from 0.27 to 0.13 cm.  The right basilic vein is compressible.  Diameter measurements range from 0.26 to 0.21 cm.  The left cephalic vein is patent where visualized.  Diameter measurements range from 0.20 to 0.12 cm.  The left basilic vein is compressible.  Diameter measurements range from 0.25 to 0.12 cm.  See attached worksheet for all measurements.  IMPRESSION:  Patent right and left cephalic and basilic veins with diameter measurements shown on the following worksheet.  ___________________________________________ Conrad Rough Rock, MD  EM/MEDQ  D:  05/05/2011  T:  05/05/2011  Job:  [id number]

## 2011-05-26 ENCOUNTER — Encounter (HOSPITAL_COMMUNITY)
Admission: RE | Admit: 2011-05-26 | Discharge: 2011-05-26 | Disposition: A | Discharge status: Home / Self Care | Payer: Medicare Other | Source: Ambulatory Visit | Attending: Nephrology | Admitting: Nephrology

## 2011-05-26 ENCOUNTER — Other Ambulatory Visit (HOSPITAL_COMMUNITY): Payer: Self-pay | Admitting: *Deleted

## 2011-05-26 DIAGNOSIS — D638 Anemia in other chronic diseases classified elsewhere: Secondary | ICD-10-CM | POA: Insufficient documentation

## 2011-05-26 DIAGNOSIS — N183 Chronic kidney disease, stage 3 unspecified: Secondary | ICD-10-CM | POA: Insufficient documentation

## 2011-05-26 LAB — POCT HEMOGLOBIN-HEMACUE: Hemoglobin: 10.4 g/dL — ABNORMAL LOW (ref 12.0–15.0)

## 2011-05-26 MED ORDER — DARBEPOETIN ALFA-POLYSORBATE 500 MCG/ML IJ SOLN
60.0000 ug | INTRAMUSCULAR | Status: DC
  Filled 2011-05-26: qty 1

## 2011-05-26 MED ORDER — DARBEPOETIN ALFA-POLYSORBATE 60 MCG/0.3ML IJ SOLN
INTRAMUSCULAR | Status: AC
  Administered 2011-05-26: 60 ug via SUBCUTANEOUS
  Filled 2011-05-26: qty 0.3

## 2011-06-20 ENCOUNTER — Other Ambulatory Visit (HOSPITAL_COMMUNITY): Payer: Self-pay | Admitting: *Deleted

## 2011-06-23 ENCOUNTER — Encounter (HOSPITAL_COMMUNITY)
Admission: RE | Admit: 2011-06-23 | Discharge: 2011-06-23 | Disposition: A | Discharge status: Home / Self Care | Payer: Medicare Other | Source: Ambulatory Visit | Attending: Nephrology | Admitting: Nephrology

## 2011-06-23 DIAGNOSIS — D638 Anemia in other chronic diseases classified elsewhere: Secondary | ICD-10-CM | POA: Insufficient documentation

## 2011-06-23 DIAGNOSIS — N183 Chronic kidney disease, stage 3 unspecified: Secondary | ICD-10-CM | POA: Insufficient documentation

## 2011-06-23 LAB — IRON AND TIBC
Iron: 119 ug/dL (ref 42–135)
TIBC: 295 ug/dL (ref 250–470)
UIBC: 176 ug/dL (ref 125–400)

## 2011-06-23 MED ORDER — DARBEPOETIN ALFA-POLYSORBATE 60 MCG/0.3ML IJ SOLN
INTRAMUSCULAR | Status: AC
  Administered 2011-06-23: 09:00:00 via SUBCUTANEOUS
  Filled 2011-06-23: qty 0.3

## 2011-06-23 MED ORDER — DARBEPOETIN ALFA-POLYSORBATE 500 MCG/ML IJ SOLN
60.0000 ug | INTRAMUSCULAR | Status: DC
  Filled 2011-06-23: qty 1

## 2011-07-06 DIAGNOSIS — N184 Chronic kidney disease, stage 4 (severe): Secondary | ICD-10-CM | POA: Diagnosis not present

## 2011-07-07 DIAGNOSIS — E113599 Type 2 diabetes mellitus with proliferative diabetic retinopathy without macular edema, unspecified eye: Secondary | ICD-10-CM | POA: Insufficient documentation

## 2011-07-07 DIAGNOSIS — E119 Type 2 diabetes mellitus without complications: Secondary | ICD-10-CM | POA: Diagnosis not present

## 2011-07-07 DIAGNOSIS — E1139 Type 2 diabetes mellitus with other diabetic ophthalmic complication: Secondary | ICD-10-CM | POA: Diagnosis not present

## 2011-07-07 DIAGNOSIS — H579 Unspecified disorder of eye and adnexa: Secondary | ICD-10-CM | POA: Diagnosis not present

## 2011-07-07 DIAGNOSIS — E11359 Type 2 diabetes mellitus with proliferative diabetic retinopathy without macular edema: Secondary | ICD-10-CM | POA: Diagnosis not present

## 2011-07-21 ENCOUNTER — Encounter (HOSPITAL_COMMUNITY): Payer: Medicare Other

## 2011-07-27 ENCOUNTER — Encounter (HOSPITAL_COMMUNITY)
Admission: RE | Admit: 2011-07-27 | Discharge: 2011-07-27 | Disposition: A | Discharge status: Home / Self Care | Payer: Medicare Other | Source: Ambulatory Visit | Attending: Nephrology | Admitting: Nephrology

## 2011-07-27 DIAGNOSIS — D638 Anemia in other chronic diseases classified elsewhere: Secondary | ICD-10-CM | POA: Insufficient documentation

## 2011-07-27 DIAGNOSIS — N183 Chronic kidney disease, stage 3 unspecified: Secondary | ICD-10-CM | POA: Diagnosis not present

## 2011-07-27 LAB — FERRITIN: Ferritin: 149 ng/mL (ref 10–291)

## 2011-07-27 LAB — IRON AND TIBC
Saturation Ratios: 31 % (ref 20–55)
TIBC: 319 ug/dL (ref 250–470)

## 2011-07-27 LAB — POCT HEMOGLOBIN-HEMACUE: Hemoglobin: 9.9 g/dL — ABNORMAL LOW (ref 12.0–15.0)

## 2011-07-27 MED ORDER — DARBEPOETIN ALFA-POLYSORBATE 60 MCG/0.3ML IJ SOLN
60.0000 ug | INTRAMUSCULAR | Status: DC
  Administered 2011-07-27: 60 ug via SUBCUTANEOUS

## 2011-07-27 MED ORDER — DARBEPOETIN ALFA-POLYSORBATE 60 MCG/0.3ML IJ SOLN
INTRAMUSCULAR | Status: AC
  Filled 2011-07-27: qty 0.3

## 2011-07-31 ENCOUNTER — Other Ambulatory Visit: Payer: Self-pay | Admitting: Gynecology

## 2011-07-31 DIAGNOSIS — Z1231 Encounter for screening mammogram for malignant neoplasm of breast: Secondary | ICD-10-CM

## 2011-08-23 ENCOUNTER — Ambulatory Visit
Admission: RE | Admit: 2011-08-23 | Discharge: 2011-08-23 | Disposition: A | Discharge status: Home / Self Care | Payer: Medicare Other | Source: Ambulatory Visit | Attending: Gynecology | Admitting: Gynecology

## 2011-08-23 DIAGNOSIS — H1130 Conjunctival hemorrhage, unspecified eye: Secondary | ICD-10-CM | POA: Diagnosis not present

## 2011-08-23 DIAGNOSIS — Z1231 Encounter for screening mammogram for malignant neoplasm of breast: Secondary | ICD-10-CM

## 2011-08-24 ENCOUNTER — Encounter (HOSPITAL_COMMUNITY)
Admission: RE | Admit: 2011-08-24 | Discharge: 2011-08-24 | Disposition: A | Discharge status: Home / Self Care | Payer: Medicare Other | Source: Ambulatory Visit | Attending: Nephrology | Admitting: Nephrology

## 2011-08-24 DIAGNOSIS — N183 Chronic kidney disease, stage 3 unspecified: Secondary | ICD-10-CM | POA: Diagnosis not present

## 2011-08-24 DIAGNOSIS — D638 Anemia in other chronic diseases classified elsewhere: Secondary | ICD-10-CM | POA: Insufficient documentation

## 2011-08-24 LAB — IRON AND TIBC
Iron: 115 ug/dL (ref 42–135)
Saturation Ratios: 32 % (ref 20–55)
TIBC: 355 ug/dL (ref 250–470)
UIBC: 240 ug/dL (ref 125–400)

## 2011-08-24 MED ORDER — DARBEPOETIN ALFA-POLYSORBATE 500 MCG/ML IJ SOLN
60.0000 ug | INTRAMUSCULAR | Status: DC
  Filled 2011-08-24: qty 1

## 2011-08-24 MED ORDER — DARBEPOETIN ALFA-POLYSORBATE 60 MCG/0.3ML IJ SOLN
INTRAMUSCULAR | Status: AC
  Administered 2011-08-24: 60 ug via SUBCUTANEOUS
  Filled 2011-08-24: qty 0.3

## 2011-08-28 DIAGNOSIS — D649 Anemia, unspecified: Secondary | ICD-10-CM | POA: Diagnosis not present

## 2011-08-28 DIAGNOSIS — N185 Chronic kidney disease, stage 5: Secondary | ICD-10-CM | POA: Diagnosis not present

## 2011-08-28 DIAGNOSIS — N2581 Secondary hyperparathyroidism of renal origin: Secondary | ICD-10-CM | POA: Diagnosis not present

## 2011-09-01 HISTORY — PX: KIDNEY TRANSPLANT: SHX239

## 2011-09-11 DIAGNOSIS — E1029 Type 1 diabetes mellitus with other diabetic kidney complication: Secondary | ICD-10-CM | POA: Diagnosis not present

## 2011-09-11 DIAGNOSIS — E11359 Type 2 diabetes mellitus with proliferative diabetic retinopathy without macular edema: Secondary | ICD-10-CM | POA: Diagnosis not present

## 2011-09-11 DIAGNOSIS — I6529 Occlusion and stenosis of unspecified carotid artery: Secondary | ICD-10-CM | POA: Diagnosis not present

## 2011-09-11 DIAGNOSIS — E785 Hyperlipidemia, unspecified: Secondary | ICD-10-CM | POA: Diagnosis not present

## 2011-09-14 DIAGNOSIS — E119 Type 2 diabetes mellitus without complications: Secondary | ICD-10-CM | POA: Diagnosis not present

## 2011-09-14 DIAGNOSIS — N8184 Pelvic muscle wasting: Secondary | ICD-10-CM | POA: Diagnosis not present

## 2011-09-20 DIAGNOSIS — E1029 Type 1 diabetes mellitus with other diabetic kidney complication: Secondary | ICD-10-CM | POA: Diagnosis not present

## 2011-09-20 DIAGNOSIS — N184 Chronic kidney disease, stage 4 (severe): Secondary | ICD-10-CM | POA: Diagnosis not present

## 2011-09-20 DIAGNOSIS — Z940 Kidney transplant status: Secondary | ICD-10-CM | POA: Diagnosis not present

## 2011-09-20 DIAGNOSIS — I498 Other specified cardiac arrhythmias: Secondary | ICD-10-CM | POA: Diagnosis not present

## 2011-09-20 DIAGNOSIS — Z48298 Encounter for aftercare following other organ transplant: Secondary | ICD-10-CM | POA: Diagnosis not present

## 2011-09-20 DIAGNOSIS — I129 Hypertensive chronic kidney disease with stage 1 through stage 4 chronic kidney disease, or unspecified chronic kidney disease: Secondary | ICD-10-CM | POA: Diagnosis present

## 2011-09-20 DIAGNOSIS — N19 Unspecified kidney failure: Secondary | ICD-10-CM | POA: Diagnosis not present

## 2011-09-20 DIAGNOSIS — R918 Other nonspecific abnormal finding of lung field: Secondary | ICD-10-CM | POA: Diagnosis not present

## 2011-09-20 DIAGNOSIS — N179 Acute kidney failure, unspecified: Secondary | ICD-10-CM | POA: Diagnosis not present

## 2011-09-20 DIAGNOSIS — N2581 Secondary hyperparathyroidism of renal origin: Secondary | ICD-10-CM | POA: Diagnosis not present

## 2011-09-20 DIAGNOSIS — N170 Acute kidney failure with tubular necrosis: Secondary | ICD-10-CM | POA: Diagnosis not present

## 2011-09-20 DIAGNOSIS — E1129 Type 2 diabetes mellitus with other diabetic kidney complication: Secondary | ICD-10-CM | POA: Diagnosis not present

## 2011-09-20 DIAGNOSIS — E119 Type 2 diabetes mellitus without complications: Secondary | ICD-10-CM | POA: Diagnosis not present

## 2011-09-20 DIAGNOSIS — D631 Anemia in chronic kidney disease: Secondary | ICD-10-CM | POA: Diagnosis present

## 2011-09-20 DIAGNOSIS — N186 End stage renal disease: Secondary | ICD-10-CM | POA: Diagnosis not present

## 2011-09-20 DIAGNOSIS — Z794 Long term (current) use of insulin: Secondary | ICD-10-CM | POA: Diagnosis not present

## 2011-09-20 DIAGNOSIS — Z452 Encounter for adjustment and management of vascular access device: Secondary | ICD-10-CM | POA: Diagnosis not present

## 2011-09-20 DIAGNOSIS — Z79899 Other long term (current) drug therapy: Secondary | ICD-10-CM | POA: Diagnosis not present

## 2011-09-20 DIAGNOSIS — Z01818 Encounter for other preprocedural examination: Secondary | ICD-10-CM | POA: Diagnosis not present

## 2011-09-21 ENCOUNTER — Inpatient Hospital Stay (HOSPITAL_COMMUNITY): Admission: RE | Admit: 2011-09-21 | Payer: Medicare Other | Source: Ambulatory Visit

## 2011-09-28 DIAGNOSIS — Z940 Kidney transplant status: Secondary | ICD-10-CM | POA: Insufficient documentation

## 2011-09-28 DIAGNOSIS — Z79899 Other long term (current) drug therapy: Secondary | ICD-10-CM | POA: Insufficient documentation

## 2011-09-28 DIAGNOSIS — I10 Essential (primary) hypertension: Secondary | ICD-10-CM | POA: Insufficient documentation

## 2011-09-28 DIAGNOSIS — N19 Unspecified kidney failure: Secondary | ICD-10-CM | POA: Diagnosis not present

## 2011-09-28 DIAGNOSIS — D899 Disorder involving the immune mechanism, unspecified: Secondary | ICD-10-CM | POA: Diagnosis not present

## 2011-09-28 DIAGNOSIS — E119 Type 2 diabetes mellitus without complications: Secondary | ICD-10-CM | POA: Diagnosis not present

## 2011-10-02 DIAGNOSIS — Z79899 Other long term (current) drug therapy: Secondary | ICD-10-CM | POA: Diagnosis not present

## 2011-10-02 DIAGNOSIS — I10 Essential (primary) hypertension: Secondary | ICD-10-CM | POA: Diagnosis not present

## 2011-10-02 DIAGNOSIS — Z940 Kidney transplant status: Secondary | ICD-10-CM | POA: Diagnosis not present

## 2011-10-05 DIAGNOSIS — E878 Other disorders of electrolyte and fluid balance, not elsewhere classified: Secondary | ICD-10-CM | POA: Diagnosis not present

## 2011-10-05 DIAGNOSIS — Z79899 Other long term (current) drug therapy: Secondary | ICD-10-CM | POA: Diagnosis not present

## 2011-10-05 DIAGNOSIS — D649 Anemia, unspecified: Secondary | ICD-10-CM | POA: Diagnosis not present

## 2011-10-05 DIAGNOSIS — Z940 Kidney transplant status: Secondary | ICD-10-CM | POA: Diagnosis not present

## 2011-10-05 DIAGNOSIS — Z794 Long term (current) use of insulin: Secondary | ICD-10-CM | POA: Diagnosis not present

## 2011-10-05 DIAGNOSIS — E11359 Type 2 diabetes mellitus with proliferative diabetic retinopathy without macular edema: Secondary | ICD-10-CM | POA: Diagnosis not present

## 2011-10-05 DIAGNOSIS — E119 Type 2 diabetes mellitus without complications: Secondary | ICD-10-CM | POA: Diagnosis not present

## 2011-10-05 DIAGNOSIS — D899 Disorder involving the immune mechanism, unspecified: Secondary | ICD-10-CM | POA: Diagnosis not present

## 2011-10-05 DIAGNOSIS — I10 Essential (primary) hypertension: Secondary | ICD-10-CM | POA: Diagnosis not present

## 2011-10-09 DIAGNOSIS — I10 Essential (primary) hypertension: Secondary | ICD-10-CM | POA: Diagnosis not present

## 2011-10-09 DIAGNOSIS — Z79899 Other long term (current) drug therapy: Secondary | ICD-10-CM | POA: Diagnosis not present

## 2011-10-09 DIAGNOSIS — E871 Hypo-osmolality and hyponatremia: Secondary | ICD-10-CM | POA: Insufficient documentation

## 2011-10-09 DIAGNOSIS — Z48298 Encounter for aftercare following other organ transplant: Secondary | ICD-10-CM | POA: Diagnosis not present

## 2011-10-09 DIAGNOSIS — D899 Disorder involving the immune mechanism, unspecified: Secondary | ICD-10-CM | POA: Diagnosis not present

## 2011-10-09 DIAGNOSIS — Z940 Kidney transplant status: Secondary | ICD-10-CM | POA: Diagnosis not present

## 2011-10-09 DIAGNOSIS — E119 Type 2 diabetes mellitus without complications: Secondary | ICD-10-CM | POA: Diagnosis not present

## 2011-10-12 DIAGNOSIS — N184 Chronic kidney disease, stage 4 (severe): Secondary | ICD-10-CM | POA: Diagnosis not present

## 2011-10-12 DIAGNOSIS — Z940 Kidney transplant status: Secondary | ICD-10-CM | POA: Diagnosis not present

## 2011-10-16 DIAGNOSIS — Z48298 Encounter for aftercare following other organ transplant: Secondary | ICD-10-CM | POA: Diagnosis not present

## 2011-10-16 DIAGNOSIS — D899 Disorder involving the immune mechanism, unspecified: Secondary | ICD-10-CM | POA: Diagnosis not present

## 2011-10-16 DIAGNOSIS — Z79899 Other long term (current) drug therapy: Secondary | ICD-10-CM | POA: Diagnosis not present

## 2011-10-16 DIAGNOSIS — Z940 Kidney transplant status: Secondary | ICD-10-CM | POA: Diagnosis not present

## 2011-10-16 DIAGNOSIS — E119 Type 2 diabetes mellitus without complications: Secondary | ICD-10-CM | POA: Diagnosis not present

## 2011-10-16 DIAGNOSIS — I10 Essential (primary) hypertension: Secondary | ICD-10-CM | POA: Diagnosis not present

## 2011-10-18 DIAGNOSIS — M129 Arthropathy, unspecified: Secondary | ICD-10-CM | POA: Diagnosis not present

## 2011-10-18 DIAGNOSIS — N170 Acute kidney failure with tubular necrosis: Secondary | ICD-10-CM | POA: Diagnosis not present

## 2011-10-18 DIAGNOSIS — N184 Chronic kidney disease, stage 4 (severe): Secondary | ICD-10-CM | POA: Diagnosis not present

## 2011-10-18 DIAGNOSIS — T8610 Unspecified complication of kidney transplant: Secondary | ICD-10-CM | POA: Diagnosis not present

## 2011-10-18 DIAGNOSIS — Z48298 Encounter for aftercare following other organ transplant: Secondary | ICD-10-CM | POA: Diagnosis not present

## 2011-10-18 DIAGNOSIS — H1130 Conjunctival hemorrhage, unspecified eye: Secondary | ICD-10-CM | POA: Diagnosis not present

## 2011-10-18 DIAGNOSIS — N269 Renal sclerosis, unspecified: Secondary | ICD-10-CM | POA: Diagnosis not present

## 2011-10-18 DIAGNOSIS — E871 Hypo-osmolality and hyponatremia: Secondary | ICD-10-CM | POA: Diagnosis not present

## 2011-10-18 DIAGNOSIS — I10 Essential (primary) hypertension: Secondary | ICD-10-CM | POA: Diagnosis not present

## 2011-10-18 DIAGNOSIS — Z8669 Personal history of other diseases of the nervous system and sense organs: Secondary | ICD-10-CM | POA: Diagnosis not present

## 2011-10-18 DIAGNOSIS — Z940 Kidney transplant status: Secondary | ICD-10-CM | POA: Diagnosis not present

## 2011-10-18 DIAGNOSIS — Z9109 Other allergy status, other than to drugs and biological substances: Secondary | ICD-10-CM | POA: Diagnosis not present

## 2011-10-18 DIAGNOSIS — E1139 Type 2 diabetes mellitus with other diabetic ophthalmic complication: Secondary | ICD-10-CM | POA: Diagnosis not present

## 2011-10-18 DIAGNOSIS — Z91040 Latex allergy status: Secondary | ICD-10-CM | POA: Diagnosis not present

## 2011-10-18 DIAGNOSIS — E11359 Type 2 diabetes mellitus with proliferative diabetic retinopathy without macular edema: Secondary | ICD-10-CM | POA: Diagnosis not present

## 2011-10-18 DIAGNOSIS — Z79899 Other long term (current) drug therapy: Secondary | ICD-10-CM | POA: Diagnosis not present

## 2011-10-24 DIAGNOSIS — N269 Renal sclerosis, unspecified: Secondary | ICD-10-CM | POA: Diagnosis not present

## 2011-10-24 DIAGNOSIS — Z940 Kidney transplant status: Secondary | ICD-10-CM | POA: Diagnosis not present

## 2011-10-26 DIAGNOSIS — Z940 Kidney transplant status: Secondary | ICD-10-CM | POA: Diagnosis not present

## 2011-10-26 DIAGNOSIS — E119 Type 2 diabetes mellitus without complications: Secondary | ICD-10-CM | POA: Diagnosis not present

## 2011-10-26 DIAGNOSIS — Z79899 Other long term (current) drug therapy: Secondary | ICD-10-CM | POA: Diagnosis not present

## 2011-10-26 DIAGNOSIS — I10 Essential (primary) hypertension: Secondary | ICD-10-CM | POA: Diagnosis not present

## 2011-10-26 DIAGNOSIS — Z8679 Personal history of other diseases of the circulatory system: Secondary | ICD-10-CM | POA: Insufficient documentation

## 2011-11-02 DIAGNOSIS — Z48298 Encounter for aftercare following other organ transplant: Secondary | ICD-10-CM | POA: Diagnosis not present

## 2011-11-02 DIAGNOSIS — E785 Hyperlipidemia, unspecified: Secondary | ICD-10-CM | POA: Diagnosis not present

## 2011-11-02 DIAGNOSIS — Z79899 Other long term (current) drug therapy: Secondary | ICD-10-CM | POA: Diagnosis not present

## 2011-11-02 DIAGNOSIS — E119 Type 2 diabetes mellitus without complications: Secondary | ICD-10-CM | POA: Diagnosis not present

## 2011-11-02 DIAGNOSIS — Z940 Kidney transplant status: Secondary | ICD-10-CM | POA: Diagnosis not present

## 2011-11-02 DIAGNOSIS — I10 Essential (primary) hypertension: Secondary | ICD-10-CM | POA: Diagnosis not present

## 2011-11-07 DIAGNOSIS — N189 Chronic kidney disease, unspecified: Secondary | ICD-10-CM | POA: Diagnosis not present

## 2011-11-07 DIAGNOSIS — Z79899 Other long term (current) drug therapy: Secondary | ICD-10-CM | POA: Diagnosis not present

## 2011-11-07 DIAGNOSIS — Z952 Presence of prosthetic heart valve: Secondary | ICD-10-CM | POA: Diagnosis not present

## 2011-11-14 DIAGNOSIS — I129 Hypertensive chronic kidney disease with stage 1 through stage 4 chronic kidney disease, or unspecified chronic kidney disease: Secondary | ICD-10-CM | POA: Diagnosis not present

## 2011-11-14 DIAGNOSIS — N189 Chronic kidney disease, unspecified: Secondary | ICD-10-CM | POA: Diagnosis not present

## 2011-11-14 DIAGNOSIS — E878 Other disorders of electrolyte and fluid balance, not elsewhere classified: Secondary | ICD-10-CM | POA: Diagnosis not present

## 2011-11-14 DIAGNOSIS — Z48298 Encounter for aftercare following other organ transplant: Secondary | ICD-10-CM | POA: Diagnosis not present

## 2011-11-14 DIAGNOSIS — Z940 Kidney transplant status: Secondary | ICD-10-CM | POA: Diagnosis not present

## 2011-11-14 DIAGNOSIS — N19 Unspecified kidney failure: Secondary | ICD-10-CM | POA: Diagnosis not present

## 2011-11-14 DIAGNOSIS — T8610 Unspecified complication of kidney transplant: Secondary | ICD-10-CM | POA: Diagnosis not present

## 2011-11-14 DIAGNOSIS — E11359 Type 2 diabetes mellitus with proliferative diabetic retinopathy without macular edema: Secondary | ICD-10-CM | POA: Diagnosis not present

## 2011-11-14 DIAGNOSIS — Z79899 Other long term (current) drug therapy: Secondary | ICD-10-CM | POA: Diagnosis not present

## 2011-11-14 DIAGNOSIS — E871 Hypo-osmolality and hyponatremia: Secondary | ICD-10-CM | POA: Diagnosis not present

## 2011-11-14 DIAGNOSIS — E119 Type 2 diabetes mellitus without complications: Secondary | ICD-10-CM | POA: Diagnosis not present

## 2011-11-14 DIAGNOSIS — E1139 Type 2 diabetes mellitus with other diabetic ophthalmic complication: Secondary | ICD-10-CM | POA: Diagnosis not present

## 2011-11-14 DIAGNOSIS — T8389XA Other specified complication of genitourinary prosthetic devices, implants and grafts, initial encounter: Secondary | ICD-10-CM | POA: Diagnosis not present

## 2011-11-14 DIAGNOSIS — H579 Unspecified disorder of eye and adnexa: Secondary | ICD-10-CM | POA: Diagnosis not present

## 2011-11-14 DIAGNOSIS — H1130 Conjunctival hemorrhage, unspecified eye: Secondary | ICD-10-CM | POA: Diagnosis not present

## 2011-11-14 DIAGNOSIS — T8619 Other complication of kidney transplant: Secondary | ICD-10-CM | POA: Insufficient documentation

## 2011-11-14 DIAGNOSIS — Z8679 Personal history of other diseases of the circulatory system: Secondary | ICD-10-CM | POA: Diagnosis not present

## 2011-11-21 DIAGNOSIS — N189 Chronic kidney disease, unspecified: Secondary | ICD-10-CM | POA: Diagnosis not present

## 2011-11-21 DIAGNOSIS — Z940 Kidney transplant status: Secondary | ICD-10-CM | POA: Diagnosis not present

## 2011-11-21 DIAGNOSIS — Z79899 Other long term (current) drug therapy: Secondary | ICD-10-CM | POA: Diagnosis not present

## 2011-11-28 DIAGNOSIS — Z940 Kidney transplant status: Secondary | ICD-10-CM | POA: Diagnosis not present

## 2011-11-28 DIAGNOSIS — I10 Essential (primary) hypertension: Secondary | ICD-10-CM | POA: Diagnosis not present

## 2011-11-28 DIAGNOSIS — Z8679 Personal history of other diseases of the circulatory system: Secondary | ICD-10-CM | POA: Diagnosis not present

## 2011-11-28 DIAGNOSIS — E878 Other disorders of electrolyte and fluid balance, not elsewhere classified: Secondary | ICD-10-CM | POA: Diagnosis not present

## 2011-11-28 DIAGNOSIS — E1139 Type 2 diabetes mellitus with other diabetic ophthalmic complication: Secondary | ICD-10-CM | POA: Diagnosis not present

## 2011-11-28 DIAGNOSIS — Z79899 Other long term (current) drug therapy: Secondary | ICD-10-CM | POA: Diagnosis not present

## 2011-11-28 DIAGNOSIS — H579 Unspecified disorder of eye and adnexa: Secondary | ICD-10-CM | POA: Diagnosis not present

## 2011-11-28 DIAGNOSIS — E785 Hyperlipidemia, unspecified: Secondary | ICD-10-CM | POA: Diagnosis not present

## 2011-11-28 DIAGNOSIS — Z48298 Encounter for aftercare following other organ transplant: Secondary | ICD-10-CM | POA: Diagnosis not present

## 2011-11-28 DIAGNOSIS — E119 Type 2 diabetes mellitus without complications: Secondary | ICD-10-CM | POA: Diagnosis not present

## 2011-11-28 DIAGNOSIS — K219 Gastro-esophageal reflux disease without esophagitis: Secondary | ICD-10-CM | POA: Diagnosis not present

## 2011-11-28 DIAGNOSIS — E11359 Type 2 diabetes mellitus with proliferative diabetic retinopathy without macular edema: Secondary | ICD-10-CM | POA: Diagnosis not present

## 2011-12-05 DIAGNOSIS — N189 Chronic kidney disease, unspecified: Secondary | ICD-10-CM | POA: Diagnosis not present

## 2011-12-05 DIAGNOSIS — Z79899 Other long term (current) drug therapy: Secondary | ICD-10-CM | POA: Diagnosis not present

## 2011-12-05 DIAGNOSIS — Z940 Kidney transplant status: Secondary | ICD-10-CM | POA: Diagnosis not present

## 2011-12-12 DIAGNOSIS — H1130 Conjunctival hemorrhage, unspecified eye: Secondary | ICD-10-CM | POA: Diagnosis not present

## 2011-12-12 DIAGNOSIS — N189 Chronic kidney disease, unspecified: Secondary | ICD-10-CM | POA: Diagnosis not present

## 2011-12-12 DIAGNOSIS — Z8679 Personal history of other diseases of the circulatory system: Secondary | ICD-10-CM | POA: Diagnosis not present

## 2011-12-12 DIAGNOSIS — E871 Hypo-osmolality and hyponatremia: Secondary | ICD-10-CM | POA: Diagnosis not present

## 2011-12-12 DIAGNOSIS — I10 Essential (primary) hypertension: Secondary | ICD-10-CM | POA: Diagnosis not present

## 2011-12-12 DIAGNOSIS — Z940 Kidney transplant status: Secondary | ICD-10-CM | POA: Diagnosis not present

## 2011-12-12 DIAGNOSIS — I129 Hypertensive chronic kidney disease with stage 1 through stage 4 chronic kidney disease, or unspecified chronic kidney disease: Secondary | ICD-10-CM | POA: Diagnosis not present

## 2011-12-12 DIAGNOSIS — W57XXXA Bitten or stung by nonvenomous insect and other nonvenomous arthropods, initial encounter: Secondary | ICD-10-CM | POA: Insufficient documentation

## 2011-12-12 DIAGNOSIS — E11359 Type 2 diabetes mellitus with proliferative diabetic retinopathy without macular edema: Secondary | ICD-10-CM | POA: Diagnosis not present

## 2011-12-12 DIAGNOSIS — E1139 Type 2 diabetes mellitus with other diabetic ophthalmic complication: Secondary | ICD-10-CM | POA: Diagnosis not present

## 2011-12-12 DIAGNOSIS — T148 Other injury of unspecified body region: Secondary | ICD-10-CM | POA: Diagnosis not present

## 2011-12-12 DIAGNOSIS — Z48298 Encounter for aftercare following other organ transplant: Secondary | ICD-10-CM | POA: Diagnosis not present

## 2011-12-12 DIAGNOSIS — E878 Other disorders of electrolyte and fluid balance, not elsewhere classified: Secondary | ICD-10-CM | POA: Diagnosis not present

## 2011-12-12 DIAGNOSIS — Z79899 Other long term (current) drug therapy: Secondary | ICD-10-CM | POA: Diagnosis not present

## 2011-12-18 DIAGNOSIS — N2581 Secondary hyperparathyroidism of renal origin: Secondary | ICD-10-CM | POA: Diagnosis not present

## 2011-12-18 DIAGNOSIS — N185 Chronic kidney disease, stage 5: Secondary | ICD-10-CM | POA: Diagnosis not present

## 2011-12-18 DIAGNOSIS — E119 Type 2 diabetes mellitus without complications: Secondary | ICD-10-CM | POA: Diagnosis not present

## 2011-12-18 DIAGNOSIS — D649 Anemia, unspecified: Secondary | ICD-10-CM | POA: Diagnosis not present

## 2011-12-18 DIAGNOSIS — Z940 Kidney transplant status: Secondary | ICD-10-CM | POA: Diagnosis not present

## 2011-12-26 DIAGNOSIS — Z79899 Other long term (current) drug therapy: Secondary | ICD-10-CM | POA: Diagnosis not present

## 2011-12-26 DIAGNOSIS — N185 Chronic kidney disease, stage 5: Secondary | ICD-10-CM | POA: Diagnosis not present

## 2011-12-26 DIAGNOSIS — D649 Anemia, unspecified: Secondary | ICD-10-CM | POA: Diagnosis not present

## 2011-12-26 DIAGNOSIS — Z940 Kidney transplant status: Secondary | ICD-10-CM | POA: Diagnosis not present

## 2011-12-28 DIAGNOSIS — N184 Chronic kidney disease, stage 4 (severe): Secondary | ICD-10-CM | POA: Diagnosis not present

## 2011-12-28 DIAGNOSIS — E1029 Type 1 diabetes mellitus with other diabetic kidney complication: Secondary | ICD-10-CM | POA: Diagnosis not present

## 2011-12-28 DIAGNOSIS — I10 Essential (primary) hypertension: Secondary | ICD-10-CM | POA: Diagnosis not present

## 2011-12-28 DIAGNOSIS — E109 Type 1 diabetes mellitus without complications: Secondary | ICD-10-CM | POA: Diagnosis not present

## 2012-01-02 DIAGNOSIS — N185 Chronic kidney disease, stage 5: Secondary | ICD-10-CM | POA: Diagnosis not present

## 2012-01-02 DIAGNOSIS — D649 Anemia, unspecified: Secondary | ICD-10-CM | POA: Diagnosis not present

## 2012-01-02 DIAGNOSIS — Z79899 Other long term (current) drug therapy: Secondary | ICD-10-CM | POA: Diagnosis not present

## 2012-01-02 DIAGNOSIS — Z940 Kidney transplant status: Secondary | ICD-10-CM | POA: Diagnosis not present

## 2012-01-05 DIAGNOSIS — D899 Disorder involving the immune mechanism, unspecified: Secondary | ICD-10-CM | POA: Diagnosis not present

## 2012-01-05 DIAGNOSIS — E119 Type 2 diabetes mellitus without complications: Secondary | ICD-10-CM | POA: Diagnosis not present

## 2012-01-05 DIAGNOSIS — H3340 Traction detachment of retina, unspecified eye: Secondary | ICD-10-CM | POA: Diagnosis not present

## 2012-01-05 DIAGNOSIS — D849 Immunodeficiency, unspecified: Secondary | ICD-10-CM | POA: Insufficient documentation

## 2012-01-05 DIAGNOSIS — H3320 Serous retinal detachment, unspecified eye: Secondary | ICD-10-CM | POA: Diagnosis not present

## 2012-01-09 DIAGNOSIS — N185 Chronic kidney disease, stage 5: Secondary | ICD-10-CM | POA: Diagnosis not present

## 2012-01-09 DIAGNOSIS — D649 Anemia, unspecified: Secondary | ICD-10-CM | POA: Diagnosis not present

## 2012-01-09 DIAGNOSIS — Z940 Kidney transplant status: Secondary | ICD-10-CM | POA: Diagnosis not present

## 2012-01-09 DIAGNOSIS — Z79899 Other long term (current) drug therapy: Secondary | ICD-10-CM | POA: Diagnosis not present

## 2012-01-16 DIAGNOSIS — N185 Chronic kidney disease, stage 5: Secondary | ICD-10-CM | POA: Diagnosis not present

## 2012-01-16 DIAGNOSIS — D649 Anemia, unspecified: Secondary | ICD-10-CM | POA: Diagnosis not present

## 2012-01-16 DIAGNOSIS — Z940 Kidney transplant status: Secondary | ICD-10-CM | POA: Diagnosis not present

## 2012-01-16 DIAGNOSIS — Z79899 Other long term (current) drug therapy: Secondary | ICD-10-CM | POA: Diagnosis not present

## 2012-01-23 DIAGNOSIS — N185 Chronic kidney disease, stage 5: Secondary | ICD-10-CM | POA: Diagnosis not present

## 2012-01-23 DIAGNOSIS — Z79899 Other long term (current) drug therapy: Secondary | ICD-10-CM | POA: Diagnosis not present

## 2012-01-23 DIAGNOSIS — D649 Anemia, unspecified: Secondary | ICD-10-CM | POA: Diagnosis not present

## 2012-01-23 DIAGNOSIS — Z940 Kidney transplant status: Secondary | ICD-10-CM | POA: Diagnosis not present

## 2012-01-31 DIAGNOSIS — D649 Anemia, unspecified: Secondary | ICD-10-CM | POA: Diagnosis not present

## 2012-01-31 DIAGNOSIS — Z940 Kidney transplant status: Secondary | ICD-10-CM | POA: Diagnosis not present

## 2012-01-31 DIAGNOSIS — N185 Chronic kidney disease, stage 5: Secondary | ICD-10-CM | POA: Diagnosis not present

## 2012-02-06 DIAGNOSIS — N185 Chronic kidney disease, stage 5: Secondary | ICD-10-CM | POA: Diagnosis not present

## 2012-02-06 DIAGNOSIS — D649 Anemia, unspecified: Secondary | ICD-10-CM | POA: Diagnosis not present

## 2012-02-06 DIAGNOSIS — Z940 Kidney transplant status: Secondary | ICD-10-CM | POA: Diagnosis not present

## 2012-02-06 DIAGNOSIS — Z79899 Other long term (current) drug therapy: Secondary | ICD-10-CM | POA: Diagnosis not present

## 2012-02-13 DIAGNOSIS — Z79899 Other long term (current) drug therapy: Secondary | ICD-10-CM | POA: Diagnosis not present

## 2012-02-13 DIAGNOSIS — Z940 Kidney transplant status: Secondary | ICD-10-CM | POA: Diagnosis not present

## 2012-02-20 DIAGNOSIS — Z940 Kidney transplant status: Secondary | ICD-10-CM | POA: Diagnosis not present

## 2012-02-20 DIAGNOSIS — D649 Anemia, unspecified: Secondary | ICD-10-CM | POA: Diagnosis not present

## 2012-02-20 DIAGNOSIS — Z79899 Other long term (current) drug therapy: Secondary | ICD-10-CM | POA: Diagnosis not present

## 2012-02-20 DIAGNOSIS — N185 Chronic kidney disease, stage 5: Secondary | ICD-10-CM | POA: Diagnosis not present

## 2012-02-22 DIAGNOSIS — E1029 Type 1 diabetes mellitus with other diabetic kidney complication: Secondary | ICD-10-CM | POA: Diagnosis not present

## 2012-02-22 DIAGNOSIS — Z940 Kidney transplant status: Secondary | ICD-10-CM | POA: Diagnosis not present

## 2012-02-22 DIAGNOSIS — I10 Essential (primary) hypertension: Secondary | ICD-10-CM | POA: Diagnosis not present

## 2012-02-27 DIAGNOSIS — Z940 Kidney transplant status: Secondary | ICD-10-CM | POA: Diagnosis not present

## 2012-03-05 DIAGNOSIS — N185 Chronic kidney disease, stage 5: Secondary | ICD-10-CM | POA: Diagnosis not present

## 2012-03-05 DIAGNOSIS — Z79899 Other long term (current) drug therapy: Secondary | ICD-10-CM | POA: Diagnosis not present

## 2012-03-05 DIAGNOSIS — Z940 Kidney transplant status: Secondary | ICD-10-CM | POA: Diagnosis not present

## 2012-03-05 DIAGNOSIS — D649 Anemia, unspecified: Secondary | ICD-10-CM | POA: Diagnosis not present

## 2012-03-12 DIAGNOSIS — D649 Anemia, unspecified: Secondary | ICD-10-CM | POA: Diagnosis not present

## 2012-03-12 DIAGNOSIS — Z79899 Other long term (current) drug therapy: Secondary | ICD-10-CM | POA: Diagnosis not present

## 2012-03-12 DIAGNOSIS — Z940 Kidney transplant status: Secondary | ICD-10-CM | POA: Diagnosis not present

## 2012-03-12 DIAGNOSIS — N185 Chronic kidney disease, stage 5: Secondary | ICD-10-CM | POA: Diagnosis not present

## 2012-03-19 DIAGNOSIS — Z79899 Other long term (current) drug therapy: Secondary | ICD-10-CM | POA: Diagnosis not present

## 2012-03-19 DIAGNOSIS — D649 Anemia, unspecified: Secondary | ICD-10-CM | POA: Diagnosis not present

## 2012-03-19 DIAGNOSIS — Z940 Kidney transplant status: Secondary | ICD-10-CM | POA: Diagnosis not present

## 2012-03-19 DIAGNOSIS — N185 Chronic kidney disease, stage 5: Secondary | ICD-10-CM | POA: Diagnosis not present

## 2012-03-29 DIAGNOSIS — Z940 Kidney transplant status: Secondary | ICD-10-CM | POA: Diagnosis not present

## 2012-03-29 DIAGNOSIS — B349 Viral infection, unspecified: Secondary | ICD-10-CM | POA: Diagnosis not present

## 2012-03-29 DIAGNOSIS — N185 Chronic kidney disease, stage 5: Secondary | ICD-10-CM | POA: Diagnosis not present

## 2012-04-09 DIAGNOSIS — N19 Unspecified kidney failure: Secondary | ICD-10-CM | POA: Diagnosis not present

## 2012-04-09 DIAGNOSIS — H3320 Serous retinal detachment, unspecified eye: Secondary | ICD-10-CM | POA: Diagnosis not present

## 2012-04-09 DIAGNOSIS — E11359 Type 2 diabetes mellitus with proliferative diabetic retinopathy without macular edema: Secondary | ICD-10-CM | POA: Diagnosis not present

## 2012-04-09 DIAGNOSIS — H3340 Traction detachment of retina, unspecified eye: Secondary | ICD-10-CM | POA: Diagnosis not present

## 2012-04-09 DIAGNOSIS — D899 Disorder involving the immune mechanism, unspecified: Secondary | ICD-10-CM | POA: Diagnosis not present

## 2012-04-09 DIAGNOSIS — D649 Anemia, unspecified: Secondary | ICD-10-CM | POA: Diagnosis not present

## 2012-04-09 DIAGNOSIS — E119 Type 2 diabetes mellitus without complications: Secondary | ICD-10-CM | POA: Diagnosis not present

## 2012-04-09 DIAGNOSIS — N2581 Secondary hyperparathyroidism of renal origin: Secondary | ICD-10-CM | POA: Diagnosis not present

## 2012-04-09 DIAGNOSIS — Z79899 Other long term (current) drug therapy: Secondary | ICD-10-CM | POA: Diagnosis not present

## 2012-04-09 DIAGNOSIS — Z940 Kidney transplant status: Secondary | ICD-10-CM | POA: Diagnosis not present

## 2012-04-23 DIAGNOSIS — Z940 Kidney transplant status: Secondary | ICD-10-CM | POA: Diagnosis not present

## 2012-04-23 DIAGNOSIS — N2581 Secondary hyperparathyroidism of renal origin: Secondary | ICD-10-CM | POA: Diagnosis not present

## 2012-04-23 DIAGNOSIS — Z79899 Other long term (current) drug therapy: Secondary | ICD-10-CM | POA: Diagnosis not present

## 2012-04-23 DIAGNOSIS — D649 Anemia, unspecified: Secondary | ICD-10-CM | POA: Diagnosis not present

## 2012-04-23 DIAGNOSIS — Z23 Encounter for immunization: Secondary | ICD-10-CM | POA: Diagnosis not present

## 2012-05-07 DIAGNOSIS — Z940 Kidney transplant status: Secondary | ICD-10-CM | POA: Diagnosis not present

## 2012-05-16 DIAGNOSIS — E785 Hyperlipidemia, unspecified: Secondary | ICD-10-CM | POA: Diagnosis not present

## 2012-05-16 DIAGNOSIS — M949 Disorder of cartilage, unspecified: Secondary | ICD-10-CM | POA: Diagnosis not present

## 2012-05-16 DIAGNOSIS — M899 Disorder of bone, unspecified: Secondary | ICD-10-CM | POA: Diagnosis not present

## 2012-05-16 DIAGNOSIS — I6529 Occlusion and stenosis of unspecified carotid artery: Secondary | ICD-10-CM | POA: Diagnosis not present

## 2012-05-16 DIAGNOSIS — E1029 Type 1 diabetes mellitus with other diabetic kidney complication: Secondary | ICD-10-CM | POA: Diagnosis not present

## 2012-05-21 DIAGNOSIS — Z79899 Other long term (current) drug therapy: Secondary | ICD-10-CM | POA: Diagnosis not present

## 2012-05-21 DIAGNOSIS — D649 Anemia, unspecified: Secondary | ICD-10-CM | POA: Diagnosis not present

## 2012-05-21 DIAGNOSIS — Z940 Kidney transplant status: Secondary | ICD-10-CM | POA: Diagnosis not present

## 2012-06-04 DIAGNOSIS — D649 Anemia, unspecified: Secondary | ICD-10-CM | POA: Diagnosis not present

## 2012-06-04 DIAGNOSIS — Z79899 Other long term (current) drug therapy: Secondary | ICD-10-CM | POA: Diagnosis not present

## 2012-06-04 DIAGNOSIS — Z940 Kidney transplant status: Secondary | ICD-10-CM | POA: Diagnosis not present

## 2012-06-18 DIAGNOSIS — D649 Anemia, unspecified: Secondary | ICD-10-CM | POA: Diagnosis not present

## 2012-06-18 DIAGNOSIS — Z79899 Other long term (current) drug therapy: Secondary | ICD-10-CM | POA: Diagnosis not present

## 2012-06-18 DIAGNOSIS — Z940 Kidney transplant status: Secondary | ICD-10-CM | POA: Diagnosis not present

## 2012-06-20 DIAGNOSIS — I10 Essential (primary) hypertension: Secondary | ICD-10-CM | POA: Diagnosis not present

## 2012-06-20 DIAGNOSIS — E1029 Type 1 diabetes mellitus with other diabetic kidney complication: Secondary | ICD-10-CM | POA: Diagnosis not present

## 2012-06-20 DIAGNOSIS — Z940 Kidney transplant status: Secondary | ICD-10-CM | POA: Diagnosis not present

## 2012-07-02 DIAGNOSIS — D649 Anemia, unspecified: Secondary | ICD-10-CM | POA: Diagnosis not present

## 2012-07-02 DIAGNOSIS — E119 Type 2 diabetes mellitus without complications: Secondary | ICD-10-CM | POA: Diagnosis not present

## 2012-07-02 DIAGNOSIS — N2581 Secondary hyperparathyroidism of renal origin: Secondary | ICD-10-CM | POA: Diagnosis not present

## 2012-07-02 DIAGNOSIS — Z940 Kidney transplant status: Secondary | ICD-10-CM | POA: Diagnosis not present

## 2012-07-02 DIAGNOSIS — E785 Hyperlipidemia, unspecified: Secondary | ICD-10-CM | POA: Diagnosis not present

## 2012-07-02 DIAGNOSIS — Z79899 Other long term (current) drug therapy: Secondary | ICD-10-CM | POA: Diagnosis not present

## 2012-07-16 DIAGNOSIS — Z79899 Other long term (current) drug therapy: Secondary | ICD-10-CM | POA: Diagnosis not present

## 2012-07-16 DIAGNOSIS — Z940 Kidney transplant status: Secondary | ICD-10-CM | POA: Diagnosis not present

## 2012-07-16 DIAGNOSIS — D649 Anemia, unspecified: Secondary | ICD-10-CM | POA: Diagnosis not present

## 2012-07-24 DIAGNOSIS — N2581 Secondary hyperparathyroidism of renal origin: Secondary | ICD-10-CM | POA: Diagnosis not present

## 2012-07-24 DIAGNOSIS — D649 Anemia, unspecified: Secondary | ICD-10-CM | POA: Diagnosis not present

## 2012-07-24 DIAGNOSIS — N185 Chronic kidney disease, stage 5: Secondary | ICD-10-CM | POA: Diagnosis not present

## 2012-07-30 ENCOUNTER — Other Ambulatory Visit: Payer: Self-pay | Admitting: Gynecology

## 2012-07-30 DIAGNOSIS — D649 Anemia, unspecified: Secondary | ICD-10-CM | POA: Diagnosis not present

## 2012-07-30 DIAGNOSIS — Z79899 Other long term (current) drug therapy: Secondary | ICD-10-CM | POA: Diagnosis not present

## 2012-07-30 DIAGNOSIS — Z1231 Encounter for screening mammogram for malignant neoplasm of breast: Secondary | ICD-10-CM

## 2012-07-30 DIAGNOSIS — Z940 Kidney transplant status: Secondary | ICD-10-CM | POA: Diagnosis not present

## 2012-08-13 DIAGNOSIS — Z940 Kidney transplant status: Secondary | ICD-10-CM | POA: Diagnosis not present

## 2012-08-23 ENCOUNTER — Other Ambulatory Visit: Payer: Self-pay | Admitting: Gynecology

## 2012-08-23 ENCOUNTER — Ambulatory Visit
Admission: RE | Admit: 2012-08-23 | Discharge: 2012-08-23 | Disposition: A | Discharge status: Home / Self Care | Payer: Medicare Other | Source: Ambulatory Visit | Attending: Gynecology | Admitting: Gynecology

## 2012-08-23 DIAGNOSIS — Z1231 Encounter for screening mammogram for malignant neoplasm of breast: Secondary | ICD-10-CM

## 2012-08-27 DIAGNOSIS — Z940 Kidney transplant status: Secondary | ICD-10-CM | POA: Diagnosis not present

## 2012-08-27 DIAGNOSIS — Z79899 Other long term (current) drug therapy: Secondary | ICD-10-CM | POA: Diagnosis not present

## 2012-08-27 DIAGNOSIS — D649 Anemia, unspecified: Secondary | ICD-10-CM | POA: Diagnosis not present

## 2012-09-12 DIAGNOSIS — N2581 Secondary hyperparathyroidism of renal origin: Secondary | ICD-10-CM | POA: Diagnosis not present

## 2012-09-12 DIAGNOSIS — E785 Hyperlipidemia, unspecified: Secondary | ICD-10-CM | POA: Diagnosis not present

## 2012-09-12 DIAGNOSIS — Z940 Kidney transplant status: Secondary | ICD-10-CM | POA: Diagnosis not present

## 2012-09-12 DIAGNOSIS — D649 Anemia, unspecified: Secondary | ICD-10-CM | POA: Diagnosis not present

## 2012-09-12 DIAGNOSIS — E1029 Type 1 diabetes mellitus with other diabetic kidney complication: Secondary | ICD-10-CM | POA: Diagnosis not present

## 2012-09-12 DIAGNOSIS — E1065 Type 1 diabetes mellitus with hyperglycemia: Secondary | ICD-10-CM | POA: Diagnosis not present

## 2012-09-12 DIAGNOSIS — E1142 Type 2 diabetes mellitus with diabetic polyneuropathy: Secondary | ICD-10-CM | POA: Diagnosis not present

## 2012-09-12 DIAGNOSIS — F329 Major depressive disorder, single episode, unspecified: Secondary | ICD-10-CM | POA: Diagnosis not present

## 2012-09-12 DIAGNOSIS — E11359 Type 2 diabetes mellitus with proliferative diabetic retinopathy without macular edema: Secondary | ICD-10-CM | POA: Diagnosis not present

## 2012-09-23 DIAGNOSIS — Z48298 Encounter for aftercare following other organ transplant: Secondary | ICD-10-CM | POA: Diagnosis not present

## 2012-09-23 DIAGNOSIS — D899 Disorder involving the immune mechanism, unspecified: Secondary | ICD-10-CM | POA: Diagnosis not present

## 2012-09-23 DIAGNOSIS — I10 Essential (primary) hypertension: Secondary | ICD-10-CM | POA: Diagnosis not present

## 2012-09-23 DIAGNOSIS — Z79899 Other long term (current) drug therapy: Secondary | ICD-10-CM | POA: Diagnosis not present

## 2012-09-23 DIAGNOSIS — Z940 Kidney transplant status: Secondary | ICD-10-CM | POA: Diagnosis not present

## 2012-09-23 DIAGNOSIS — E119 Type 2 diabetes mellitus without complications: Secondary | ICD-10-CM | POA: Diagnosis not present

## 2012-09-24 DIAGNOSIS — Z23 Encounter for immunization: Secondary | ICD-10-CM | POA: Diagnosis not present

## 2012-10-08 DIAGNOSIS — Z79899 Other long term (current) drug therapy: Secondary | ICD-10-CM | POA: Diagnosis not present

## 2012-10-08 DIAGNOSIS — D649 Anemia, unspecified: Secondary | ICD-10-CM | POA: Diagnosis not present

## 2012-10-08 DIAGNOSIS — Z940 Kidney transplant status: Secondary | ICD-10-CM | POA: Diagnosis not present

## 2012-10-24 DIAGNOSIS — E1029 Type 1 diabetes mellitus with other diabetic kidney complication: Secondary | ICD-10-CM | POA: Diagnosis not present

## 2012-10-24 DIAGNOSIS — I10 Essential (primary) hypertension: Secondary | ICD-10-CM | POA: Diagnosis not present

## 2012-10-24 DIAGNOSIS — Z940 Kidney transplant status: Secondary | ICD-10-CM | POA: Diagnosis not present

## 2012-10-24 DIAGNOSIS — E1065 Type 1 diabetes mellitus with hyperglycemia: Secondary | ICD-10-CM | POA: Diagnosis not present

## 2012-11-05 DIAGNOSIS — N2581 Secondary hyperparathyroidism of renal origin: Secondary | ICD-10-CM | POA: Diagnosis not present

## 2012-11-05 DIAGNOSIS — D649 Anemia, unspecified: Secondary | ICD-10-CM | POA: Diagnosis not present

## 2012-11-05 DIAGNOSIS — E785 Hyperlipidemia, unspecified: Secondary | ICD-10-CM | POA: Diagnosis not present

## 2012-11-05 DIAGNOSIS — E119 Type 2 diabetes mellitus without complications: Secondary | ICD-10-CM | POA: Diagnosis not present

## 2012-11-05 DIAGNOSIS — Z79899 Other long term (current) drug therapy: Secondary | ICD-10-CM | POA: Diagnosis not present

## 2012-11-05 DIAGNOSIS — Z940 Kidney transplant status: Secondary | ICD-10-CM | POA: Diagnosis not present

## 2012-11-12 DIAGNOSIS — H3320 Serous retinal detachment, unspecified eye: Secondary | ICD-10-CM | POA: Diagnosis not present

## 2012-11-12 DIAGNOSIS — Z940 Kidney transplant status: Secondary | ICD-10-CM | POA: Diagnosis not present

## 2012-11-12 DIAGNOSIS — H1130 Conjunctival hemorrhage, unspecified eye: Secondary | ICD-10-CM | POA: Diagnosis not present

## 2012-11-12 DIAGNOSIS — H3340 Traction detachment of retina, unspecified eye: Secondary | ICD-10-CM | POA: Diagnosis not present

## 2012-11-12 DIAGNOSIS — N19 Unspecified kidney failure: Secondary | ICD-10-CM | POA: Diagnosis not present

## 2012-11-12 DIAGNOSIS — D899 Disorder involving the immune mechanism, unspecified: Secondary | ICD-10-CM | POA: Diagnosis not present

## 2012-11-12 DIAGNOSIS — E119 Type 2 diabetes mellitus without complications: Secondary | ICD-10-CM | POA: Diagnosis not present

## 2012-11-12 DIAGNOSIS — E11359 Type 2 diabetes mellitus with proliferative diabetic retinopathy without macular edema: Secondary | ICD-10-CM | POA: Diagnosis not present

## 2012-12-03 DIAGNOSIS — Z940 Kidney transplant status: Secondary | ICD-10-CM | POA: Diagnosis not present

## 2012-12-17 DIAGNOSIS — D649 Anemia, unspecified: Secondary | ICD-10-CM | POA: Diagnosis not present

## 2012-12-17 DIAGNOSIS — N185 Chronic kidney disease, stage 5: Secondary | ICD-10-CM | POA: Diagnosis not present

## 2012-12-17 DIAGNOSIS — N2581 Secondary hyperparathyroidism of renal origin: Secondary | ICD-10-CM | POA: Diagnosis not present

## 2013-01-07 DIAGNOSIS — Z940 Kidney transplant status: Secondary | ICD-10-CM | POA: Diagnosis not present

## 2013-01-20 ENCOUNTER — Telehealth: Payer: Self-pay | Admitting: Hematology & Oncology

## 2013-01-20 NOTE — Telephone Encounter (Signed)
Left message for pt to call and schedule appointment °

## 2013-01-21 ENCOUNTER — Telehealth: Payer: Self-pay | Admitting: Hematology & Oncology

## 2013-01-21 NOTE — Telephone Encounter (Signed)
Left message for pt to call and schedule appointment. Left message with Shaquina's VM pt has not called to schedule appointment

## 2013-01-27 ENCOUNTER — Telehealth: Payer: Self-pay | Admitting: Hematology & Oncology

## 2013-01-27 DIAGNOSIS — E785 Hyperlipidemia, unspecified: Secondary | ICD-10-CM | POA: Diagnosis not present

## 2013-01-27 DIAGNOSIS — D696 Thrombocytopenia, unspecified: Secondary | ICD-10-CM | POA: Diagnosis not present

## 2013-01-27 DIAGNOSIS — F329 Major depressive disorder, single episode, unspecified: Secondary | ICD-10-CM | POA: Diagnosis not present

## 2013-01-27 DIAGNOSIS — Z940 Kidney transplant status: Secondary | ICD-10-CM | POA: Diagnosis not present

## 2013-01-27 DIAGNOSIS — E1142 Type 2 diabetes mellitus with diabetic polyneuropathy: Secondary | ICD-10-CM | POA: Diagnosis not present

## 2013-01-27 DIAGNOSIS — D649 Anemia, unspecified: Secondary | ICD-10-CM | POA: Diagnosis not present

## 2013-01-27 DIAGNOSIS — E1065 Type 1 diabetes mellitus with hyperglycemia: Secondary | ICD-10-CM | POA: Diagnosis not present

## 2013-01-27 DIAGNOSIS — E11359 Type 2 diabetes mellitus with proliferative diabetic retinopathy without macular edema: Secondary | ICD-10-CM | POA: Diagnosis not present

## 2013-01-27 NOTE — Telephone Encounter (Signed)
Pt called made 8-11 appointment

## 2013-02-04 DIAGNOSIS — N2581 Secondary hyperparathyroidism of renal origin: Secondary | ICD-10-CM | POA: Diagnosis not present

## 2013-02-04 DIAGNOSIS — E119 Type 2 diabetes mellitus without complications: Secondary | ICD-10-CM | POA: Diagnosis not present

## 2013-02-04 DIAGNOSIS — D649 Anemia, unspecified: Secondary | ICD-10-CM | POA: Diagnosis not present

## 2013-02-04 DIAGNOSIS — E782 Mixed hyperlipidemia: Secondary | ICD-10-CM | POA: Diagnosis not present

## 2013-02-04 DIAGNOSIS — E785 Hyperlipidemia, unspecified: Secondary | ICD-10-CM | POA: Diagnosis not present

## 2013-02-04 DIAGNOSIS — Z940 Kidney transplant status: Secondary | ICD-10-CM | POA: Diagnosis not present

## 2013-02-04 DIAGNOSIS — Z79899 Other long term (current) drug therapy: Secondary | ICD-10-CM | POA: Diagnosis not present

## 2013-02-10 ENCOUNTER — Ambulatory Visit (HOSPITAL_BASED_OUTPATIENT_CLINIC_OR_DEPARTMENT_OTHER): Payer: Medicare Other | Admitting: Hematology & Oncology

## 2013-02-10 ENCOUNTER — Other Ambulatory Visit (HOSPITAL_BASED_OUTPATIENT_CLINIC_OR_DEPARTMENT_OTHER): Payer: Medicare Other | Admitting: Lab

## 2013-02-10 ENCOUNTER — Ambulatory Visit: Payer: Medicare Other

## 2013-02-10 VITALS — BP 157/61 | HR 56 | Temp 98.0°F | Resp 16 | Ht 65.0 in | Wt 115.0 lb

## 2013-02-10 DIAGNOSIS — D696 Thrombocytopenia, unspecified: Secondary | ICD-10-CM

## 2013-02-10 DIAGNOSIS — E119 Type 2 diabetes mellitus without complications: Secondary | ICD-10-CM | POA: Diagnosis not present

## 2013-02-10 DIAGNOSIS — Z940 Kidney transplant status: Secondary | ICD-10-CM

## 2013-02-10 LAB — CBC WITH DIFFERENTIAL (CANCER CENTER ONLY)
EOS%: 3.9 % (ref 0.0–7.0)
Eosinophils Absolute: 0.2 10*3/uL (ref 0.0–0.5)
LYMPH%: 20.3 % (ref 14.0–48.0)
MCH: 28.6 pg (ref 26.0–34.0)
MCHC: 33.2 g/dL (ref 32.0–36.0)
MCV: 86 fL (ref 81–101)
MONO%: 9.8 % (ref 0.0–13.0)
Platelets: 64 10*3/uL — ABNORMAL LOW (ref 145–400)
RBC: 4.23 10*6/uL (ref 3.70–5.32)
RDW: 13.2 % (ref 11.1–15.7)

## 2013-02-10 LAB — CHCC SATELLITE - SMEAR

## 2013-02-10 NOTE — Progress Notes (Signed)
This office note has been dictated.

## 2013-02-11 ENCOUNTER — Telehealth: Payer: Self-pay | Admitting: Hematology & Oncology

## 2013-02-11 NOTE — Telephone Encounter (Signed)
Pt aware of 9-2 BMBX to be NPO after midnight check in at [facility] admitting and she will need a driver. She is also aware of 9-16 MD

## 2013-02-12 NOTE — Progress Notes (Signed)
CC:   Sherril Croon, M.D. Ishmael Holter. Forde Dandy, M.D. [facility], Fax [phone]  DIAGNOSES: 1. Thrombocytopenia. 2. Status post kidney transplant in March 2013.  HISTORY OF PRESENT ILLNESS:  Ms. Holsey is a very charming 67 year old white female.  She has had long-standing insulin-dependent diabetes. She subsequently developed renal insufficiency.  She then underwent a renal transplant back in March of 2013.  She got her to transplant at [facility].  She is followed in [facility] by Dr. Edrick Oh of [facility].  She is also seen by Dr. Carrolyn Meiers for her diabetes.  Dr. Justin Mend has noticed that her platelet count has been dropping.  This was not a problem for her pre-transplant.  Going through some of the labs back in June, her platelet count was 72,000.  Her white cell count was 4.7, hemoglobin was 12.  MCV was 86. She had a normal white cell differential.  Electrolytes all looked good. Her liver function tests were up slightly.  She is on tacrolimus.  Levels have been monitored.  She had a followup CBC on 01/07/2013.  This showed a white count of 4.4, hemoglobin 12, hematocrit 36, platelet count 62,000.  MCV was 84.  She underwent EBV and CMV titers.  These were both negative.  She has had no problems with bleeding or bruising.  She has had no weight loss or weight gain.  Her appetite has been good.  She has had no fevers, sweats or chills.  There has been no, I think, change in her medications.  She is kindly referred to the [place] [facility] for an evaluation.  PAST MEDICAL HISTORY: 1. Type 1 diabetes. 2. Status post renal transplant.  ALLERGIES:  Lasix.  MEDICATIONS:  Norvasc 10 mg p.o. daily, Vytorin 10/20 one p.o. q.h.s., Prozac 40 mg p.o. daily, NovoLog insulin as needed, Levemir insulin as scheduled, Lopressor __________ 25 mg __________ daily, Bactrim 1 p.o. Monday, Wednesday and Friday, Prograf 2 mg p.o. b.i.d.,  Myfortic 180 mg 2 p.o. q.a.m. and 1 p.o. q p.m.  SOCIAL HISTORY:  Negative for tobacco use.  There is no alcohol use. She has no obvious occupational exposures.  FAMILY HISTORY:  Noncontributory.  There is no obvious blood issue in the family.  REVIEW OF SYSTEMS:  As stated in the history of present illness.  No additional findings are noted on a 12-system review.  PHYSICAL EXAMINATION:  General:  This is a petite white female in no obvious distress.  Vital signs:  Temperature of 98, pulse 56, respiratory rate 16, blood pressure 157/61.  Weight is 115 pounds.  Head and neck:  Normocephalic, atraumatic skull.  There are no ocular or oral lesions.  There are no palpable cervical or supraclavicular lymph nodes. Lungs:  Clear bilaterally.  Cardiac:  Regular rate and rhythm with a normal S1 and S2.  There are no murmurs, rubs or bruits.  Abdomen: Soft.  She has the laparotomy scars from her transplant.  These are well healed.  She has a laparotomy scar from her C-section.  This is well healed.  There is no palpable hepatosplenomegaly.  Back:  Shows no kyphosis.  There is no tenderness over the spine, ribs or hips. Extremities:  Show no clubbing, cyanosis or edema.  Neurological:  Shows no focal neurological deficits.  Skin:  Shows no rashes, ecchymosis, or petechia.  LABORATORY STUDIES:  White cell count is 6.1, hemoglobin 12.1, hematocrit 36.5, platelet count 64,000.  MCV is 86.  Peripheral  smear shows a normochromic, normocytic population of red blood cells.  There are no nucleated red blood cells.  There are no teardrop cells.  I see no rouleaux formation.  She has no target cells. There are no schistocytes.  White blood cells appear normal in morphology and maturation.  There are no immature myeloid or lymphoid forms.  There are no hypersegmented polys.  There are no atypical lymphocytes.  I see no immature myeloid forms.  There are no blasts. Platelets are decreased in number.   Platelets are well granulated. There may be a couple of large platelets.  IMPRESSION:  Ms. Betancourth is a very charming 67 year old white female. She is originally from [place].  She has been in the [place] most of her life.  She has thrombocytopenia.  It is hard to say how long she has had this. Last labs that I have on her were from June.  I suppose that she probably had this before then.  Her blood smear certainly is unremarkable.  I do not see anything on the blood smear that was that would suggest a microangiopathic process.  Unfortunately, I think we are going to have to put Ms. Kosanke through a bone marrow test.  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  I think the only way we would know this is with a bone marrow biopsy.  She was tested for CMV and EBV.  These were both negative from the results that I have back.  I cannot imagine this being some type of malignancy within the bone marrow.  If so, one would suspect that the white cells and red cells would be affected.  We will go ahead and get her set up with a bone marrow biopsy on September 2nd.  I told her to make sure that someone takes her to the hospital for this.  [facility] will [facility] at [facility].  I told her that she is not to eat anything after midnight.  She can take her medicines in the morning.  We will use sedation.  I will get her back to see [place] in probably about a month or so.  I think the bone marrow will clearly give [place] an idea as to what is going on and if we need to intervene with any medications or if she needs an adjustment with her rejection drugs.  I spent a good hour or so with Ms. Blomgren and her daughter.  They are both very, very nice.  It was nice talking to them.    ______________________________ Volanda Napoleon, M.D. PRE/MEDQ  D:  02/10/2013  T:  02/11/2013  Job:  [id number]

## 2013-03-04 ENCOUNTER — Encounter (HOSPITAL_COMMUNITY): Payer: Self-pay | Admitting: Pharmacy Technician

## 2013-03-04 ENCOUNTER — Encounter (HOSPITAL_COMMUNITY)
Admission: RE | Admit: 2013-03-04 | Discharge: 2013-03-04 | Disposition: A | Discharge status: Home / Self Care | Payer: Medicare Other | Source: Ambulatory Visit | Attending: Hematology & Oncology | Admitting: Hematology & Oncology

## 2013-03-04 ENCOUNTER — Encounter (HOSPITAL_COMMUNITY): Payer: Self-pay

## 2013-03-04 ENCOUNTER — Ambulatory Visit (HOSPITAL_BASED_OUTPATIENT_CLINIC_OR_DEPARTMENT_OTHER): Payer: Medicare Other | Admitting: Hematology & Oncology

## 2013-03-04 VITALS — BP 133/64 | HR 57 | Temp 98.1°F | Resp 18 | Ht 66.0 in | Wt 114.0 lb

## 2013-03-04 DIAGNOSIS — Z940 Kidney transplant status: Secondary | ICD-10-CM | POA: Insufficient documentation

## 2013-03-04 DIAGNOSIS — D72819 Decreased white blood cell count, unspecified: Secondary | ICD-10-CM | POA: Insufficient documentation

## 2013-03-04 DIAGNOSIS — D696 Thrombocytopenia, unspecified: Secondary | ICD-10-CM

## 2013-03-04 DIAGNOSIS — D649 Anemia, unspecified: Secondary | ICD-10-CM | POA: Diagnosis not present

## 2013-03-04 DIAGNOSIS — T8610 Unspecified complication of kidney transplant: Secondary | ICD-10-CM

## 2013-03-04 LAB — CBC WITH DIFFERENTIAL/PLATELET
Basophils Absolute: 0 10*3/uL (ref 0.0–0.1)
Eosinophils Absolute: 0.2 10*3/uL (ref 0.0–0.7)
HCT: 38.7 % (ref 36.0–46.0)
Lymphocytes Relative: 25 % (ref 12–46)
Lymphs Abs: 1 10*3/uL (ref 0.7–4.0)
MCHC: 33.3 g/dL (ref 30.0–36.0)
MCV: 84.5 fL (ref 78.0–100.0)
Neutro Abs: 2.3 10*3/uL (ref 1.7–7.7)
RDW: 13.5 % (ref 11.5–15.5)

## 2013-03-04 MED ORDER — MEPERIDINE HCL 25 MG/ML IJ SOLN
INTRAMUSCULAR | Status: AC | PRN
  Administered 2013-03-04 (×2): 12.5 mg via INTRAVENOUS

## 2013-03-04 MED ORDER — MEPERIDINE HCL 50 MG/ML IJ SOLN
50.0000 mg | Freq: Once | INTRAMUSCULAR | Status: DC
  Filled 2013-03-04: qty 1

## 2013-03-04 MED ORDER — SODIUM CHLORIDE 0.9 % IV SOLN: Freq: Once | INTRAVENOUS | Status: DC

## 2013-03-04 MED ORDER — MIDAZOLAM HCL 10 MG/2ML IJ SOLN
5.0000 mg | Freq: Once | INTRAMUSCULAR | Status: DC
  Filled 2013-03-04: qty 2

## 2013-03-04 MED ORDER — MIDAZOLAM HCL 2 MG/2ML IJ SOLN
INTRAMUSCULAR | Status: AC | PRN
  Administered 2013-03-04: 1 mg via INTRAVENOUS
  Administered 2013-03-04: 1.5 mg via INTRAVENOUS

## 2013-03-04 NOTE — ED Notes (Signed)
Dressing remains CDI

## 2013-03-04 NOTE — ED Notes (Signed)
Patient denies pain and is resting comfortably.  

## 2013-03-04 NOTE — ED Notes (Signed)
Dressing CDI 

## 2013-03-04 NOTE — ED Notes (Signed)
Procedure ends. Dressing of gauze and hypafix to left post iliac area and patient placed supine with towel to site for pressure

## 2013-03-04 NOTE — ED Notes (Signed)
Family updated as to patient's status.

## 2013-03-04 NOTE — Sedation Documentation (Signed)
Medication dose calculated and verified for: DEMEROL 25 mg IV; VERSED 2.5 mg IV

## 2013-03-04 NOTE — ED Notes (Signed)
Ambulated slowly in room with assist and tolerated this well. Dressing remains CDI

## 2013-03-04 NOTE — Progress Notes (Signed)
This office note has been dictated.

## 2013-03-04 NOTE — Procedures (Signed)
Jasmine Cummings was brought to the short stay unit at [facility]. We did the appropriate time-out procedure.  Her Mallampati score was 1. ASA class was 1.  She had an IV placed peripherally in the right hand.  She was then placed onto her right side.  She received a total of 25 mg of Demerol and 2.5 mg of Versed for IV sedation.  The left posterior iliac crest region was prepped and draped in a sterile fashion.  5 cc of 2% lidocaine were infiltrated under the skin down to the periosteum.  A #11 scalpel was used to make an incision into the skin.  The marrow was "tight."  We did get back some liquid, but this was incredibly dilute.  Because of her history of renal transplantation, I did get cultures for bacteria, viral and fungus.  I then used a #11 scalpel to make another incision into the skin.  We obtained a good bone marrow biopsy.  This was through the Jamshidi needle.  I split the biopsy core in half and sent 1 off for cultures.  We cleaned the incision site sterilely and it dressed sterilely.  The patient tolerated the procedure well.  There were no complications.    ______________________________ Volanda Napoleon, M.D. PRE/MEDQ  D:  03/04/2013  T:  03/04/2013  Job:  [id number]

## 2013-03-06 LAB — MISCELLANEOUS TEST

## 2013-03-07 LAB — TISSUE CULTURE

## 2013-03-11 DIAGNOSIS — D649 Anemia, unspecified: Secondary | ICD-10-CM | POA: Diagnosis not present

## 2013-03-11 DIAGNOSIS — Z940 Kidney transplant status: Secondary | ICD-10-CM | POA: Diagnosis not present

## 2013-03-11 DIAGNOSIS — E119 Type 2 diabetes mellitus without complications: Secondary | ICD-10-CM | POA: Diagnosis not present

## 2013-03-11 DIAGNOSIS — N2581 Secondary hyperparathyroidism of renal origin: Secondary | ICD-10-CM | POA: Diagnosis not present

## 2013-03-11 DIAGNOSIS — E785 Hyperlipidemia, unspecified: Secondary | ICD-10-CM | POA: Diagnosis not present

## 2013-03-11 DIAGNOSIS — Z79899 Other long term (current) drug therapy: Secondary | ICD-10-CM | POA: Diagnosis not present

## 2013-03-18 ENCOUNTER — Other Ambulatory Visit (HOSPITAL_BASED_OUTPATIENT_CLINIC_OR_DEPARTMENT_OTHER): Payer: Medicare Other | Admitting: Lab

## 2013-03-18 ENCOUNTER — Ambulatory Visit (HOSPITAL_BASED_OUTPATIENT_CLINIC_OR_DEPARTMENT_OTHER): Payer: Medicare Other | Admitting: Hematology & Oncology

## 2013-03-18 VITALS — BP 166/62 | HR 64 | Temp 98.1°F | Resp 16 | Ht 66.0 in | Wt 113.0 lb

## 2013-03-18 DIAGNOSIS — D696 Thrombocytopenia, unspecified: Secondary | ICD-10-CM | POA: Diagnosis not present

## 2013-03-18 DIAGNOSIS — D6959 Other secondary thrombocytopenia: Secondary | ICD-10-CM

## 2013-03-18 LAB — CBC WITH DIFFERENTIAL (CANCER CENTER ONLY)
BASO%: 0.5 % (ref 0.0–2.0)
EOS%: 5.7 % (ref 0.0–7.0)
HCT: 36.9 % (ref 34.8–46.6)
LYMPH%: 20.7 % (ref 14.0–48.0)
MCHC: 33.3 g/dL (ref 32.0–36.0)
MCV: 87 fL (ref 81–101)
MONO%: 9.1 % (ref 0.0–13.0)
NEUT%: 64 % (ref 39.6–80.0)
RDW: 13 % (ref 11.1–15.7)

## 2013-03-18 NOTE — Progress Notes (Signed)
This office note has been dictated.

## 2013-03-25 DIAGNOSIS — N184 Chronic kidney disease, stage 4 (severe): Secondary | ICD-10-CM | POA: Diagnosis not present

## 2013-03-25 DIAGNOSIS — Z23 Encounter for immunization: Secondary | ICD-10-CM | POA: Diagnosis not present

## 2013-03-25 DIAGNOSIS — E1029 Type 1 diabetes mellitus with other diabetic kidney complication: Secondary | ICD-10-CM | POA: Diagnosis not present

## 2013-03-25 DIAGNOSIS — I10 Essential (primary) hypertension: Secondary | ICD-10-CM | POA: Diagnosis not present

## 2013-03-31 LAB — FUNGUS CULTURE W SMEAR: Fungal Smear: NONE SEEN

## 2013-04-01 LAB — MISCELLANEOUS TEST

## 2013-04-01 NOTE — Progress Notes (Signed)
CC:   Ishmael Holter. Forde Dandy, M.D. Sherril Croon, M.D. [facility], Fax [phone]  DIAGNOSES: 1. Thrombocytopenia, possible medication induced. 2. Kidney transplant in March 2013.  CURRENT THERAPY:  Observation.  INTERIM HISTORY:  Ms. Resop comes in for followup.  We did go ahead and do a bone marrow biopsy and aspirate on her.  This was done on September 2nd.  We got a very good specimen.  The pathology report [id number]) did not show any lymphoproliferative process.  There were no granulomas. She had negative stains and cultures.  In essence, her bone marrow was pretty much normal.  As such, the thrombocytopenia is not from any bone marrow issue.  This certainly could be a drug-related issue.  Possibly it might be immune thrombocytopenia, but I think this would be difficult to prove.  She is asymptomatic with this.  She is not bleeding.  There is no bruising.  She is on her complement of different transplant medications.  She is doing well with this.  I reassured her that from my point of view, I did not see any changes that she had to make because of the thrombocytopenia.  PHYSICAL EXAMINATION:  General:  This is a thin but well-nourished white female in no obvious distress.  Vital signs:  Temperature of 98.1, pulse 64, respiratory rate 16, blood pressure 166/62.  Weight is 113.  Head and neck:  Normocephalic, atraumatic skull.  There are no ocular or oral lesions.  She has no palpable cervical or supraclavicular lymph nodes. There is no scleral icterus.  There is no palatal petechia.  Thyroid is not palpable.  Lungs:  Clear bilaterally.  Cardiac:  Regular rate and rhythm with a normal S1 and S2.  There are no murmurs, rubs or bruits. Abdomen:  Soft.  She has good bowel sounds.  There is no fluid wave. There is no palpable hepatosplenomegaly.  Extremities:  No clubbing, cyanosis or edema.  Skin:  No rashes, ecchymoses or petechiae. Neurologic:  No focal  neurological deficits.  LABORATORY STUDIES:  White cell count is 3.9, hemoglobin 12.3, hematocrit 36.9, platelet count 68,000.  Her peripheral smear, which I reviewed, showed decreased platelets.  She had several large platelets.  Platelets were well granulated.  She has mature white cells.  There are no hypersegmented polys.  I see no nucleated red blood cells.  There are no schistocytes or spherocytes.  IMPRESSION:  Ms. Alzate is a very charming 67 year old white female who had a kidney transplant back in March of 2013.  She now has some thrombocytopenia.  The bone marrow did not show any bone marrow issue.  Again, I was worried about posttransplant lymphoproliferative (PTLD).  I do not see any evidence of this.  There is no evidence of infection.  She had normal chromosomes on her bone marrow biopsy.  I think we can probably get her back in 4 months now.  I think that if her platelet count is stable in 4 months, then hopefully we can let her go from the clinic.  I spent a good half hour or so with Ms. Gonsalez.  I reviewed her bone marrow with her and her daughter.  I went over her lab work with her.    ______________________________ Volanda Napoleon, M.D. PRE/MEDQ  D:  03/18/2013  T:  04/01/2013  Job:  [id number]

## 2013-04-03 ENCOUNTER — Encounter: Payer: Self-pay | Admitting: Hematology & Oncology

## 2013-04-07 DIAGNOSIS — I120 Hypertensive chronic kidney disease with stage 5 chronic kidney disease or end stage renal disease: Secondary | ICD-10-CM | POA: Diagnosis not present

## 2013-04-07 DIAGNOSIS — D649 Anemia, unspecified: Secondary | ICD-10-CM | POA: Diagnosis not present

## 2013-04-07 DIAGNOSIS — N185 Chronic kidney disease, stage 5: Secondary | ICD-10-CM | POA: Diagnosis not present

## 2013-04-07 DIAGNOSIS — Z940 Kidney transplant status: Secondary | ICD-10-CM | POA: Diagnosis not present

## 2013-04-08 DIAGNOSIS — D649 Anemia, unspecified: Secondary | ICD-10-CM | POA: Diagnosis not present

## 2013-04-08 DIAGNOSIS — Z79899 Other long term (current) drug therapy: Secondary | ICD-10-CM | POA: Diagnosis not present

## 2013-04-08 DIAGNOSIS — E119 Type 2 diabetes mellitus without complications: Secondary | ICD-10-CM | POA: Diagnosis not present

## 2013-04-08 DIAGNOSIS — Z940 Kidney transplant status: Secondary | ICD-10-CM | POA: Diagnosis not present

## 2013-04-08 DIAGNOSIS — E785 Hyperlipidemia, unspecified: Secondary | ICD-10-CM | POA: Diagnosis not present

## 2013-04-08 DIAGNOSIS — N2581 Secondary hyperparathyroidism of renal origin: Secondary | ICD-10-CM | POA: Diagnosis not present

## 2013-04-10 DIAGNOSIS — R21 Rash and other nonspecific skin eruption: Secondary | ICD-10-CM | POA: Diagnosis not present

## 2013-04-10 DIAGNOSIS — E1029 Type 1 diabetes mellitus with other diabetic kidney complication: Secondary | ICD-10-CM | POA: Diagnosis not present

## 2013-04-10 DIAGNOSIS — Z681 Body mass index (BMI) 19 or less, adult: Secondary | ICD-10-CM | POA: Diagnosis not present

## 2013-04-16 LAB — AFB CULTURE WITH SMEAR (NOT AT ARMC)

## 2013-05-06 DIAGNOSIS — N2581 Secondary hyperparathyroidism of renal origin: Secondary | ICD-10-CM | POA: Diagnosis not present

## 2013-05-06 DIAGNOSIS — E785 Hyperlipidemia, unspecified: Secondary | ICD-10-CM | POA: Diagnosis not present

## 2013-05-06 DIAGNOSIS — Z940 Kidney transplant status: Secondary | ICD-10-CM | POA: Diagnosis not present

## 2013-05-06 DIAGNOSIS — D649 Anemia, unspecified: Secondary | ICD-10-CM | POA: Diagnosis not present

## 2013-05-06 DIAGNOSIS — E119 Type 2 diabetes mellitus without complications: Secondary | ICD-10-CM | POA: Diagnosis not present

## 2013-05-06 DIAGNOSIS — Z79899 Other long term (current) drug therapy: Secondary | ICD-10-CM | POA: Diagnosis not present

## 2013-05-27 DIAGNOSIS — E119 Type 2 diabetes mellitus without complications: Secondary | ICD-10-CM | POA: Diagnosis not present

## 2013-05-27 DIAGNOSIS — N19 Unspecified kidney failure: Secondary | ICD-10-CM | POA: Diagnosis not present

## 2013-05-27 DIAGNOSIS — E871 Hypo-osmolality and hyponatremia: Secondary | ICD-10-CM | POA: Diagnosis not present

## 2013-05-27 DIAGNOSIS — Z79899 Other long term (current) drug therapy: Secondary | ICD-10-CM | POA: Diagnosis not present

## 2013-05-27 DIAGNOSIS — H3340 Traction detachment of retina, unspecified eye: Secondary | ICD-10-CM | POA: Diagnosis not present

## 2013-05-27 DIAGNOSIS — D899 Disorder involving the immune mechanism, unspecified: Secondary | ICD-10-CM | POA: Diagnosis not present

## 2013-05-27 DIAGNOSIS — H3320 Serous retinal detachment, unspecified eye: Secondary | ICD-10-CM | POA: Diagnosis not present

## 2013-05-27 DIAGNOSIS — E11359 Type 2 diabetes mellitus with proliferative diabetic retinopathy without macular edema: Secondary | ICD-10-CM | POA: Diagnosis not present

## 2013-05-28 DIAGNOSIS — Z1331 Encounter for screening for depression: Secondary | ICD-10-CM | POA: Diagnosis not present

## 2013-05-28 DIAGNOSIS — M899 Disorder of bone, unspecified: Secondary | ICD-10-CM | POA: Diagnosis not present

## 2013-05-28 DIAGNOSIS — I10 Essential (primary) hypertension: Secondary | ICD-10-CM | POA: Diagnosis not present

## 2013-05-28 DIAGNOSIS — N2581 Secondary hyperparathyroidism of renal origin: Secondary | ICD-10-CM | POA: Diagnosis not present

## 2013-05-28 DIAGNOSIS — E1142 Type 2 diabetes mellitus with diabetic polyneuropathy: Secondary | ICD-10-CM | POA: Diagnosis not present

## 2013-05-28 DIAGNOSIS — E11359 Type 2 diabetes mellitus with proliferative diabetic retinopathy without macular edema: Secondary | ICD-10-CM | POA: Diagnosis not present

## 2013-05-28 DIAGNOSIS — E785 Hyperlipidemia, unspecified: Secondary | ICD-10-CM | POA: Diagnosis not present

## 2013-05-28 DIAGNOSIS — E1029 Type 1 diabetes mellitus with other diabetic kidney complication: Secondary | ICD-10-CM | POA: Diagnosis not present

## 2013-05-28 DIAGNOSIS — Z940 Kidney transplant status: Secondary | ICD-10-CM | POA: Diagnosis not present

## 2013-06-03 DIAGNOSIS — E785 Hyperlipidemia, unspecified: Secondary | ICD-10-CM | POA: Diagnosis not present

## 2013-06-03 DIAGNOSIS — Z79899 Other long term (current) drug therapy: Secondary | ICD-10-CM | POA: Diagnosis not present

## 2013-06-03 DIAGNOSIS — E119 Type 2 diabetes mellitus without complications: Secondary | ICD-10-CM | POA: Diagnosis not present

## 2013-06-03 DIAGNOSIS — D649 Anemia, unspecified: Secondary | ICD-10-CM | POA: Diagnosis not present

## 2013-06-03 DIAGNOSIS — Z940 Kidney transplant status: Secondary | ICD-10-CM | POA: Diagnosis not present

## 2013-06-03 DIAGNOSIS — N2581 Secondary hyperparathyroidism of renal origin: Secondary | ICD-10-CM | POA: Diagnosis not present

## 2013-06-09 NOTE — Progress Notes (Signed)
CC:   Ishmael Holter. Forde Dandy, M.D. Sherril Croon, M.D.  DIAGNOSES: 1. Thrombocytopenia - possible medication induced. 2. Kidney transplant in March 2013.  CURRENT THERAPY:  Observation.  INTERIM HISTORY:  Jasmine Cummings comes in for followup.  We did go ahead and do a bone marrow biopsy and aspirate on her.  This was done on September 2nd.  We got a very good specimen.  The pathology report [id number] did not show any lymphoproliferative process.  There were no granulomas. She had negative stains and cultures.  In essence, her bone marrow was pretty much normal.  In essence, the thrombocytopenia is not from any bone marrow issue. This certainly could be a drug-related issue.  Possibly, it might be immune thrombocytopenia, but I think this would be difficult to prove.  She is asymptomatic with this.  She is not bleeding.  There is no bruising.  She is on her complement of different transplant medications. She is doing well with this.  I have reassured her that from my point of view, I do not see any changes that she had to make because of the thrombocytopenia.  PHYSICAL EXAMINATION:  General:  This is a thin, but well-nourished white female, in no obvious distress.  Vital signs:  Temperature 98.1, pulse 64, respiratory rate 16, blood pressure 166/62.  Weight is 113. Head and Neck:  Normocephalic, atraumatic skull.  There are no ocular or oral lesions.  There is no palpable cervical or supraclavicular lymph nodes.  There is no scleral icterus.  There is no palatal petechiae. Thyroid is nonpalpable.  Lungs:  Clear bilaterally.  Cardiac:  Regular rate and rhythm with a normal S1 and S2.  There are no murmurs, rubs, or bruits.  Abdomen:  Soft.  She has good bowel sounds.  There is no fluid wave.  There is no palpable hepatosplenomegaly.  Extremities:  No clubbing, cyanosis, or edema.  Skin:  No rashes, ecchymoses, or petechiae.  Neurological:  No focal neurological deficits.  LABORATORY  STUDIES:  White cell count is 3.9, hemoglobin 12.3, hematocrit 36.9, platelet count 68,000.  Peripheral smear, which I reviewed, showed decreased platelets.  She had several large platelets.  Platelets were well granulated.  She has mature white cells.  There are no hypersegmented polys.  I see no nucleated red blood cells.  There is no schistocytes or spherocytes.  IMPRESSION:  Jasmine Cummings is a very charming 67 year old white female, who has had a kidney transplant back in March of 2013.  She now has some thrombocytopenia.  The bone marrow did not show any bone marrow issue.  Again, I was worried about posttransplant lymphoproliferative disease (PTLD).  I do not see any evidence of this.  There is no evidence of infection.  She had normal chromosomes on her bone marrow biopsy.  I think we will probably get her back in 4 months now.  I think that if her platelet count is stable in 4 months, then hopefully we can let her go from the clinic.  I spent a good half hour or so with [name].  I reviewed her bone marrow with her and her daughter.  Went over her lab work with her.    ______________________________ Jasmine Cummings, M.D. PRE/MEDQ  D:  03/18/2013  T:  06/08/2013  Job:  [id number]

## 2013-07-09 DIAGNOSIS — E1065 Type 1 diabetes mellitus with hyperglycemia: Secondary | ICD-10-CM | POA: Diagnosis not present

## 2013-07-09 DIAGNOSIS — E1029 Type 1 diabetes mellitus with other diabetic kidney complication: Secondary | ICD-10-CM | POA: Diagnosis not present

## 2013-07-09 DIAGNOSIS — Z940 Kidney transplant status: Secondary | ICD-10-CM | POA: Diagnosis not present

## 2013-07-09 DIAGNOSIS — I10 Essential (primary) hypertension: Secondary | ICD-10-CM | POA: Diagnosis not present

## 2013-07-15 ENCOUNTER — Telehealth: Payer: Self-pay | Admitting: Hematology & Oncology

## 2013-07-15 DIAGNOSIS — Z940 Kidney transplant status: Secondary | ICD-10-CM | POA: Diagnosis not present

## 2013-07-15 DIAGNOSIS — D649 Anemia, unspecified: Secondary | ICD-10-CM | POA: Diagnosis not present

## 2013-07-15 DIAGNOSIS — N2581 Secondary hyperparathyroidism of renal origin: Secondary | ICD-10-CM | POA: Diagnosis not present

## 2013-07-15 DIAGNOSIS — E785 Hyperlipidemia, unspecified: Secondary | ICD-10-CM | POA: Diagnosis not present

## 2013-07-15 DIAGNOSIS — Z79899 Other long term (current) drug therapy: Secondary | ICD-10-CM | POA: Diagnosis not present

## 2013-07-15 DIAGNOSIS — E119 Type 2 diabetes mellitus without complications: Secondary | ICD-10-CM | POA: Diagnosis not present

## 2013-07-15 NOTE — Telephone Encounter (Signed)
Pt called cx 1-16, rescheduled for 08-14-13

## 2013-07-18 ENCOUNTER — Ambulatory Visit: Payer: Medicare Other | Admitting: Hematology & Oncology

## 2013-07-18 ENCOUNTER — Other Ambulatory Visit: Payer: Medicare Other | Admitting: Lab

## 2013-08-01 ENCOUNTER — Other Ambulatory Visit: Payer: Self-pay

## 2013-08-01 DIAGNOSIS — Z1231 Encounter for screening mammogram for malignant neoplasm of breast: Secondary | ICD-10-CM

## 2013-08-11 DIAGNOSIS — N185 Chronic kidney disease, stage 5: Secondary | ICD-10-CM | POA: Diagnosis not present

## 2013-08-11 DIAGNOSIS — Z940 Kidney transplant status: Secondary | ICD-10-CM | POA: Diagnosis not present

## 2013-08-14 ENCOUNTER — Encounter: Payer: Self-pay | Admitting: Hematology & Oncology

## 2013-08-14 ENCOUNTER — Ambulatory Visit (HOSPITAL_BASED_OUTPATIENT_CLINIC_OR_DEPARTMENT_OTHER): Payer: Medicare Other | Admitting: Hematology & Oncology

## 2013-08-14 ENCOUNTER — Other Ambulatory Visit (HOSPITAL_BASED_OUTPATIENT_CLINIC_OR_DEPARTMENT_OTHER): Payer: Medicare Other | Admitting: Lab

## 2013-08-14 VITALS — BP 149/64 | HR 56 | Temp 98.3°F | Resp 14 | Ht 66.0 in | Wt 121.0 lb

## 2013-08-14 DIAGNOSIS — D6959 Other secondary thrombocytopenia: Secondary | ICD-10-CM | POA: Diagnosis not present

## 2013-08-14 DIAGNOSIS — T50905A Adverse effect of unspecified drugs, medicaments and biological substances, initial encounter: Principal | ICD-10-CM

## 2013-08-14 DIAGNOSIS — T50904A Poisoning by unspecified drugs, medicaments and biological substances, undetermined, initial encounter: Secondary | ICD-10-CM

## 2013-08-14 LAB — CBC WITH DIFFERENTIAL (CANCER CENTER ONLY)
BASO#: 0 10*3/uL (ref 0.0–0.2)
BASO%: 0.6 % (ref 0.0–2.0)
EOS ABS: 0.3 10*3/uL (ref 0.0–0.5)
EOS%: 6.1 % (ref 0.0–7.0)
HEMATOCRIT: 36.6 % (ref 34.8–46.6)
HEMOGLOBIN: 12.2 g/dL (ref 11.6–15.9)
LYMPH#: 1.2 10*3/uL (ref 0.9–3.3)
LYMPH%: 23 % (ref 14.0–48.0)
MCH: 28.8 pg (ref 26.0–34.0)
MCHC: 33.3 g/dL (ref 32.0–36.0)
MCV: 86 fL (ref 81–101)
MONO#: 0.5 10*3/uL (ref 0.1–0.9)
MONO%: 8.9 % (ref 0.0–13.0)
NEUT#: 3.1 10*3/uL (ref 1.5–6.5)
NEUT%: 61.4 % (ref 39.6–80.0)
Platelets: 83 10*3/uL — ABNORMAL LOW (ref 145–400)
RBC: 4.24 10*6/uL (ref 3.70–5.32)
RDW: 12.8 % (ref 11.1–15.7)
WBC: 5.1 10*3/uL (ref 3.9–10.0)

## 2013-08-14 LAB — CHCC SATELLITE - SMEAR

## 2013-08-14 NOTE — Progress Notes (Signed)
Hematology and Oncology Follow Up Visit  Jasmine Cummings [id number] 09-20-1945 68 y.o. 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 Last are back in September. We did a bone marrow biopsy on her last year. This was pretty much unremarkable. Cytogenetics were normal. There is no flow cytometry evidence of a monoclonal population of cells. As such, I felt that the thrombocytopenia was related to her medications.  She has some bruising. There's been no bleeding. The bruise is chronic. She's eating well. She's not having problems with her blood sugars. She is on a different insulin regimen right now.  She is under a lot of stress because of her husband's health.  She's had no issue with the flu. She is having no problems with diarrhea or constipation.  Medications: Current outpatient prescriptions:amLODipine (NORVASC) 10 MG tablet, Take 10 mg by mouth at bedtime., Disp: , Rfl: ;  ezetimibe-simvastatin (VYTORIN) 10-20 MG per tablet, Take 1 tablet by mouth at bedtime. Take Mon, Wed, Fri at HS, Disp: , Rfl: ;  FLUoxetine (PROZAC) 40 MG capsule, Take 40 mg by mouth every morning. , Disp: , Rfl:  insulin aspart (NOVOLOG) 100 UNIT/ML injection, Inject 2-10 Units into the skin 4 (four) times daily. Sliding Scale, Disp: , Rfl: ;  Insulin Glargine (LANTUS [place]), Inject 9 Units into the skin daily. 9 units in AM and 4  units in PM, Disp: , Rfl: ;  metoprolol tartrate (LOPRESSOR) 25 MG tablet, Take 50 mg by mouth 2 (two) times daily. , Disp: , Rfl: ;  Multiple Vitamin (MULTIVITAMIN) capsule, Take 1 capsule by mouth daily.  , Disp: , Rfl:  mycophenolate (MYFORTIC) 180 MG EC tablet, Take 180-360 mg by mouth 2 (two) times daily. Takes 2 in the morning and 1 at night, Disp: , Rfl: ;  NOVOFINE 32G X 6 MM MISC, as needed. , Disp: ,  Rfl: ;  omeprazole (PRILOSEC) 20 MG capsule, Take 20 mg by mouth daily., Disp: , Rfl: ;  sulfamethoxazole-trimethoprim (BACTRIM,SEPTRA) 400-80 MG per tablet, Take 400 tablets by mouth. Take Mon, Wed, Fri one tablet, Disp: , Rfl:  tacrolimus (PROGRAF) 1 MG capsule, Take 1-2 mg by mouth 2 (two) times daily. Takes 2 in the morning and 1 at night, Disp: , Rfl: ;  zinc gluconate 50 MG tablet, Take 50 mg by mouth daily., Disp: , Rfl:   Allergies:  Allergies  Allergen Reactions  . Latex Hives    REACTION: swelling    Past Medical History, Surgical history, Social history, and Family History were reviewed and updated.  Review of Systems: As above  Physical Exam:  height is _0  (1.676 m) and weight is 121 lb (54.885 kg). Her oral temperature is 98.3 F (36.8 C). Her blood pressure is 149/64 and her pulse is 56. Her respiration is 14.   General well-nourished white female in no obvious distress. Head and neck exam shows no other oral lesions. There are no palpable cervical or supraclavicular lymph nodes. Lungs are clear bilaterally. Cardiac exam regular rate and rhythm with no murmurs rubs or bruits. Abdomen is soft. She has a probably scars. There is no fluid wave. Her kidney transplant is in the right abdomen. Back exam no osteoporosis. No kyphosis. Extremities shows no clubbing cyanosis or edema. Skin exam shows a few scattered ecchymoses.  No petechia.  Lab Results  Component Value Date   WBC 5.1 08/14/2013   HGB 12.2 08/14/2013   HCT 36.6 08/14/2013   MCV 86 08/14/2013   PLT 83 Platelet count consistent in citrate* 08/14/2013     Chemistry      Component Value Date/Time   NA 131* 09/15/2010 0815   K 4.1 09/15/2010 0815   CL 97 09/15/2010 0815   CO2 26 09/15/2010 0815   BUN 77* 09/15/2010 0815   CREATININE 3.68* 09/15/2010 0815      Component Value Date/Time   CALCIUM 8.7 09/15/2010 0815         Impression and Plan: Jasmine Cummings is a 68 year old white female with thrombocytopenia. We  did the ultimate test, that being a bone marrow biopsy. This did not show any malignancy or myelodysplastic changes.  Again I believe that the thrombocytopenia is related to her medications for her kidney transplant.  I think we can probably get her back in 6 months for followup. I don't see that we need any blood work in between visits.   Volanda Napoleon, MD 2/12/201512:14 PM

## 2013-08-25 ENCOUNTER — Ambulatory Visit
Admission: RE | Admit: 2013-08-25 | Discharge: 2013-08-25 | Disposition: A | Discharge status: Home / Self Care | Payer: Medicare Other | Source: Ambulatory Visit

## 2013-08-25 DIAGNOSIS — E785 Hyperlipidemia, unspecified: Secondary | ICD-10-CM | POA: Diagnosis not present

## 2013-08-25 DIAGNOSIS — Z940 Kidney transplant status: Secondary | ICD-10-CM | POA: Diagnosis not present

## 2013-08-25 DIAGNOSIS — E119 Type 2 diabetes mellitus without complications: Secondary | ICD-10-CM | POA: Diagnosis not present

## 2013-08-25 DIAGNOSIS — Z1231 Encounter for screening mammogram for malignant neoplasm of breast: Secondary | ICD-10-CM

## 2013-09-30 DIAGNOSIS — N19 Unspecified kidney failure: Secondary | ICD-10-CM | POA: Diagnosis not present

## 2013-09-30 DIAGNOSIS — Z8679 Personal history of other diseases of the circulatory system: Secondary | ICD-10-CM | POA: Diagnosis not present

## 2013-09-30 DIAGNOSIS — Z48298 Encounter for aftercare following other organ transplant: Secondary | ICD-10-CM | POA: Diagnosis not present

## 2013-09-30 DIAGNOSIS — D899 Disorder involving the immune mechanism, unspecified: Secondary | ICD-10-CM | POA: Diagnosis not present

## 2013-09-30 DIAGNOSIS — E871 Hypo-osmolality and hyponatremia: Secondary | ICD-10-CM | POA: Diagnosis not present

## 2013-09-30 DIAGNOSIS — I10 Essential (primary) hypertension: Secondary | ICD-10-CM | POA: Diagnosis not present

## 2013-09-30 DIAGNOSIS — H3320 Serous retinal detachment, unspecified eye: Secondary | ICD-10-CM | POA: Diagnosis not present

## 2013-09-30 DIAGNOSIS — Z79899 Other long term (current) drug therapy: Secondary | ICD-10-CM | POA: Diagnosis not present

## 2013-09-30 DIAGNOSIS — E878 Other disorders of electrolyte and fluid balance, not elsewhere classified: Secondary | ICD-10-CM | POA: Diagnosis not present

## 2013-09-30 DIAGNOSIS — H1130 Conjunctival hemorrhage, unspecified eye: Secondary | ICD-10-CM | POA: Diagnosis not present

## 2013-09-30 DIAGNOSIS — E11359 Type 2 diabetes mellitus with proliferative diabetic retinopathy without macular edema: Secondary | ICD-10-CM | POA: Diagnosis not present

## 2013-09-30 DIAGNOSIS — Z940 Kidney transplant status: Secondary | ICD-10-CM | POA: Diagnosis not present

## 2013-10-01 DIAGNOSIS — D649 Anemia, unspecified: Secondary | ICD-10-CM | POA: Diagnosis not present

## 2013-10-01 DIAGNOSIS — N2581 Secondary hyperparathyroidism of renal origin: Secondary | ICD-10-CM | POA: Diagnosis not present

## 2013-10-01 DIAGNOSIS — F329 Major depressive disorder, single episode, unspecified: Secondary | ICD-10-CM | POA: Diagnosis not present

## 2013-10-01 DIAGNOSIS — E11359 Type 2 diabetes mellitus with proliferative diabetic retinopathy without macular edema: Secondary | ICD-10-CM | POA: Diagnosis not present

## 2013-10-01 DIAGNOSIS — I10 Essential (primary) hypertension: Secondary | ICD-10-CM | POA: Diagnosis not present

## 2013-10-01 DIAGNOSIS — F3289 Other specified depressive episodes: Secondary | ICD-10-CM | POA: Diagnosis not present

## 2013-10-01 DIAGNOSIS — E785 Hyperlipidemia, unspecified: Secondary | ICD-10-CM | POA: Diagnosis not present

## 2013-10-01 DIAGNOSIS — E1142 Type 2 diabetes mellitus with diabetic polyneuropathy: Secondary | ICD-10-CM | POA: Diagnosis not present

## 2013-10-01 DIAGNOSIS — E1029 Type 1 diabetes mellitus with other diabetic kidney complication: Secondary | ICD-10-CM | POA: Diagnosis not present

## 2013-10-09 ENCOUNTER — Other Ambulatory Visit (HOSPITAL_COMMUNITY)
Admission: RE | Admit: 2013-10-09 | Discharge: 2013-10-09 | Disposition: A | Discharge status: Home / Self Care | Payer: Medicare Other | Source: Ambulatory Visit | Attending: Gynecology | Admitting: Gynecology

## 2013-10-09 ENCOUNTER — Ambulatory Visit (INDEPENDENT_AMBULATORY_CARE_PROVIDER_SITE_OTHER): Payer: Medicare Other | Admitting: Gynecology

## 2013-10-09 ENCOUNTER — Encounter: Payer: Self-pay | Admitting: Gynecology

## 2013-10-09 VITALS — BP 140/86 | Ht 65.25 in | Wt 123.0 lb

## 2013-10-09 DIAGNOSIS — N952 Postmenopausal atrophic vaginitis: Secondary | ICD-10-CM | POA: Diagnosis not present

## 2013-10-09 DIAGNOSIS — N951 Menopausal and female climacteric states: Secondary | ICD-10-CM | POA: Diagnosis not present

## 2013-10-09 DIAGNOSIS — M8580 Other specified disorders of bone density and structure, unspecified site: Secondary | ICD-10-CM | POA: Insufficient documentation

## 2013-10-09 DIAGNOSIS — Z01419 Encounter for gynecological examination (general) (routine) without abnormal findings: Secondary | ICD-10-CM | POA: Diagnosis not present

## 2013-10-09 DIAGNOSIS — M899 Disorder of bone, unspecified: Secondary | ICD-10-CM | POA: Diagnosis not present

## 2013-10-09 DIAGNOSIS — Z1151 Encounter for screening for human papillomavirus (HPV): Secondary | ICD-10-CM | POA: Diagnosis not present

## 2013-10-09 DIAGNOSIS — M949 Disorder of cartilage, unspecified: Secondary | ICD-10-CM | POA: Diagnosis not present

## 2013-10-09 DIAGNOSIS — Z124 Encounter for screening for malignant neoplasm of cervix: Secondary | ICD-10-CM | POA: Diagnosis not present

## 2013-10-09 NOTE — Patient Instructions (Signed)
Bone Densitometry Bone densitometry is a special X-ray that measures your bone density and can be used to help predict your risk of bone fractures. This test is used to determine bone mineral content and density to diagnose osteoporosis. Osteoporosis is the loss of bone that may cause the bone to become weak. Osteoporosis commonly occurs in women entering menopause. However, it may be found in men and in people with other diseases. PREPARATION FOR TEST No preparation necessary. WHO SHOULD BE TESTED?  All women older than 65.  Postmenopausal women (50 to 65) with risk factors for osteoporosis.  People with a previous fracture caused by normal activities.  People with a small body frame (less than 127 poundsor a body mass index [BMI] of less than 21).  People who have a parent with a hip fracture or history of osteoporosis.  People who smoke.  People who have rheumatoid arthritis.  Anyone who engages in excessive alcohol use (more than 3 drinks most days).  Women who experience early menopause. WHEN SHOULD YOU BE RETESTED? Current guidelines suggest that you should wait at least 2 years before doing a bone density test again if your first test was normal.Recent studies indicated that women with normal bone density may be able to wait a few years before needing to repeat a bone density test. You should discuss this with your caregiver.  NORMAL FINDINGS   Normal: less than standard deviation below normal (greater than -1).  Osteopenia: 1 to 2.5 standard deviations below normal (-1 to -2.5).  Osteoporosis: greater than 2.5 standard deviations below normal (less than -2.5). Test results are reported as a "T score" and a "Z score."The T score is a number that compares your bone density with the bone density of healthy, young women.The Z score is a number that compares your bone density with the scores of women who are the same age, gender, and race.  Ranges for normal findings may vary  among different laboratories and hospitals. You should always check with your doctor after having lab work or other tests done to discuss the meaning of your test results and whether your values are considered within normal limits. MEANING OF TEST  Your caregiver will go over the test results with you and discuss the importance and meaning of your results, as well as treatment options and the need for additional tests if necessary. OBTAINING THE TEST RESULTS It is your responsibility to obtain your test results. Ask the lab or department performing the test when and how you will get your results. Document Released: 07/11/2004 Document Revised: 09/11/2011 Document Reviewed: 08/03/2010 [employer] Patient Information 2014 [employer].  

## 2013-10-10 NOTE — Progress Notes (Signed)
Jasmine Cummings 1946-04-18 [id number]   History:    68 y.o.  for GYN exam. Patient is a new patient to the practice. Patient was previously been followed by Dr. Ubaldo Glassing. Her last gynecological exam by him was in 2013. Patient had a right kidney transplant in 2013 and is being followed by Dr. Justin Mend nephrologist. Patient also suffers from type 1 diabetes and she has been under the care of Dr. Roque Cash. Her ophthalmologist is Dr. Ebony Cargo which cc several times a year. Her last bone density study was in 2012 with the lowest T score at the left femoral neck with a value of -1.3. Patient stated her last colonoscopy was in 2011 which was normal. She had her mammogram this year. Patient denies any prior history of abnormal Pap smear. Last Pap smear 2012. The patient's several years ago had been on hormone replacement therapy and currently not on any HRT. The patient has complained of on and off vasomotor symptoms and vaginal dryness. Patient states that her shingles and Tdap vaccine are up-to-date.  Past medical history,surgical history, family history and social history were all reviewed and documented in the EPIC chart.  Gynecologic History No LMP recorded. Patient is postmenopausal. Contraception: post menopausal status Last Pap: 2012. Results were: normal Last mammogram: 2015. Results were: normal  Obstetric History OB History  Gravida Para Term Preterm AB SAB TAB Ectopic Multiple Living  2 2       1 2     # Outcome Date GA Lbr Len/2nd Weight Sex Delivery Anes PTL Lv  2 PAR           1 PAR                ROS: A ROS was performed and pertinent positives and negatives are included in the history.  GENERAL: No fevers or chills. HEENT: No change in vision, no earache, sore throat or sinus congestion. NECK: No pain or stiffness. CARDIOVASCULAR: No chest pain or pressure. No palpitations. PULMONARY: No shortness of breath, cough or wheeze. GASTROINTESTINAL: No abdominal pain, nausea, vomiting or  diarrhea, melena or bright red blood per rectum. GENITOURINARY: No urinary frequency, urgency, hesitancy or dysuria. MUSCULOSKELETAL: No joint or muscle pain, no back pain, no recent trauma. DERMATOLOGIC: No rash, no itching, no lesions. ENDOCRINE: No polyuria, polydipsia, no heat or cold intolerance. No recent change in weight. HEMATOLOGICAL: No anemia or easy bruising or bleeding. NEUROLOGIC: No headache, seizures, numbness, tingling or weakness. PSYCHIATRIC: No depression, no loss of interest in normal activity or change in sleep pattern.     Exam: chaperone present  BP 140/86  Ht 5' 5.25" (1.657 m)  Wt 123 lb (55.792 kg)  BMI 20.32 kg/m2  Body mass index is 20.32 kg/(m^2).  General appearance : Well developed well nourished female. No acute distress HEENT: Neck supple, trachea midline, no carotid bruits, no thyroidmegaly Lungs: Clear to auscultation, no rhonchi or wheezes, or rib retractions  Heart: Regular rate and rhythm, no murmurs or gallops Breast:Examined in sitting and supine position were symmetrical in appearance, no palpable masses or tenderness,  no skin retraction, no nipple inversion, no nipple discharge, no skin discoloration, no axillary or supraclavicular lymphadenopathy Abdomen: no palpable masses or tenderness, no rebound or guarding Extremities: no edema or skin discoloration or tenderness  Pelvic:  Bartholin, Urethra, Skene Glands: Within normal limits             Vagina: No gross lesions or discharge, atrophic changes  Cervix: No  gross lesions or discharge  Uterus  axial, normal size, shape and consistency, non-tender and mobile  Adnexa  Without masses or tenderness  Anus and perineum  normal   Rectovaginal  normal sphincter tone without palpated masses or tenderness             Hemoccult PCP provides     Assessment/Plan:  68 y.o. female postmenopausal with no prior history of abnormal Pap smears. We discussed the new guidelines. Since it has been 3 years and  was obtained by another provider we are going to do a Pap smear today and that she will no longer needs Pap smears. She will schedule a bone density study with [place] in the next few weeks. She will continue to get her blood work by her primary care physicians. She was reminded of the importance of calcium and vitamin D and regular exercise for osteoporosis prevention. We also discussed importance of monthly breast exam.  Note: This dictation was prepared with  Dragon/digital dictation along withSmart phrase technology. Any transcriptional errors that result from this process are unintentional.   Terrance Mass MD, 1:12 PM 10/10/2013

## 2013-10-28 DIAGNOSIS — Z940 Kidney transplant status: Secondary | ICD-10-CM | POA: Diagnosis not present

## 2013-11-12 DIAGNOSIS — I10 Essential (primary) hypertension: Secondary | ICD-10-CM | POA: Diagnosis not present

## 2013-11-12 DIAGNOSIS — E1029 Type 1 diabetes mellitus with other diabetic kidney complication: Secondary | ICD-10-CM | POA: Diagnosis not present

## 2013-11-12 DIAGNOSIS — Z940 Kidney transplant status: Secondary | ICD-10-CM | POA: Diagnosis not present

## 2013-11-25 DIAGNOSIS — Z940 Kidney transplant status: Secondary | ICD-10-CM | POA: Diagnosis not present

## 2013-12-02 DIAGNOSIS — H3340 Traction detachment of retina, unspecified eye: Secondary | ICD-10-CM | POA: Diagnosis not present

## 2013-12-02 DIAGNOSIS — H3320 Serous retinal detachment, unspecified eye: Secondary | ICD-10-CM | POA: Diagnosis not present

## 2013-12-02 DIAGNOSIS — E11359 Type 2 diabetes mellitus with proliferative diabetic retinopathy without macular edema: Secondary | ICD-10-CM | POA: Diagnosis not present

## 2013-12-02 DIAGNOSIS — H1130 Conjunctival hemorrhage, unspecified eye: Secondary | ICD-10-CM | POA: Diagnosis not present

## 2013-12-02 DIAGNOSIS — Z79899 Other long term (current) drug therapy: Secondary | ICD-10-CM | POA: Diagnosis not present

## 2013-12-02 DIAGNOSIS — Z940 Kidney transplant status: Secondary | ICD-10-CM | POA: Diagnosis not present

## 2013-12-02 DIAGNOSIS — N19 Unspecified kidney failure: Secondary | ICD-10-CM | POA: Diagnosis not present

## 2013-12-04 ENCOUNTER — Ambulatory Visit (INDEPENDENT_AMBULATORY_CARE_PROVIDER_SITE_OTHER): Payer: Medicare Other

## 2013-12-04 DIAGNOSIS — M899 Disorder of bone, unspecified: Secondary | ICD-10-CM

## 2013-12-04 DIAGNOSIS — M949 Disorder of cartilage, unspecified: Secondary | ICD-10-CM

## 2013-12-04 DIAGNOSIS — M8580 Other specified disorders of bone density and structure, unspecified site: Secondary | ICD-10-CM

## 2013-12-08 ENCOUNTER — Telehealth: Payer: Self-pay | Admitting: *Deleted

## 2013-12-08 NOTE — Telephone Encounter (Signed)
Message copied by Thamas Jaegers on Mon Dec 08, 2013  2:35 PM ------      Message from: Sinclair Grooms      Created: Mon Dec 08, 2013  9:35 AM                   ----- Message -----         From: Sinclair Grooms         Sent: 12/08/2013   9:35 AM           To: Terrance Mass, MD             ------

## 2013-12-08 NOTE — Telephone Encounter (Signed)
error 

## 2013-12-09 DIAGNOSIS — Z79899 Other long term (current) drug therapy: Secondary | ICD-10-CM | POA: Diagnosis not present

## 2013-12-09 DIAGNOSIS — E119 Type 2 diabetes mellitus without complications: Secondary | ICD-10-CM | POA: Diagnosis not present

## 2013-12-09 DIAGNOSIS — I129 Hypertensive chronic kidney disease with stage 1 through stage 4 chronic kidney disease, or unspecified chronic kidney disease: Secondary | ICD-10-CM | POA: Diagnosis not present

## 2013-12-09 DIAGNOSIS — N269 Renal sclerosis, unspecified: Secondary | ICD-10-CM | POA: Diagnosis not present

## 2013-12-09 DIAGNOSIS — E872 Acidosis, unspecified: Secondary | ICD-10-CM | POA: Diagnosis not present

## 2013-12-09 DIAGNOSIS — IMO0001 Reserved for inherently not codable concepts without codable children: Secondary | ICD-10-CM | POA: Diagnosis not present

## 2013-12-09 DIAGNOSIS — R1013 Epigastric pain: Secondary | ICD-10-CM | POA: Diagnosis not present

## 2013-12-09 DIAGNOSIS — R1115 Cyclical vomiting syndrome unrelated to migraine: Secondary | ICD-10-CM | POA: Diagnosis not present

## 2013-12-09 DIAGNOSIS — R1110 Vomiting, unspecified: Secondary | ICD-10-CM | POA: Diagnosis not present

## 2013-12-09 DIAGNOSIS — Z9089 Acquired absence of other organs: Secondary | ICD-10-CM | POA: Diagnosis not present

## 2013-12-09 DIAGNOSIS — Z7982 Long term (current) use of aspirin: Secondary | ICD-10-CM | POA: Diagnosis not present

## 2013-12-09 DIAGNOSIS — N189 Chronic kidney disease, unspecified: Secondary | ICD-10-CM | POA: Diagnosis not present

## 2013-12-09 DIAGNOSIS — Z940 Kidney transplant status: Secondary | ICD-10-CM | POA: Diagnosis not present

## 2013-12-09 DIAGNOSIS — K279 Peptic ulcer, site unspecified, unspecified as acute or chronic, without hemorrhage or perforation: Secondary | ICD-10-CM | POA: Diagnosis not present

## 2013-12-09 DIAGNOSIS — Z794 Long term (current) use of insulin: Secondary | ICD-10-CM | POA: Diagnosis not present

## 2013-12-09 DIAGNOSIS — Z9851 Tubal ligation status: Secondary | ICD-10-CM | POA: Diagnosis not present

## 2013-12-09 DIAGNOSIS — N200 Calculus of kidney: Secondary | ICD-10-CM | POA: Diagnosis not present

## 2013-12-09 DIAGNOSIS — K2900 Acute gastritis without bleeding: Secondary | ICD-10-CM | POA: Diagnosis not present

## 2013-12-09 DIAGNOSIS — R112 Nausea with vomiting, unspecified: Secondary | ICD-10-CM | POA: Diagnosis not present

## 2013-12-09 DIAGNOSIS — N179 Acute kidney failure, unspecified: Secondary | ICD-10-CM | POA: Diagnosis not present

## 2013-12-09 DIAGNOSIS — E871 Hypo-osmolality and hyponatremia: Secondary | ICD-10-CM | POA: Diagnosis not present

## 2013-12-09 DIAGNOSIS — E860 Dehydration: Secondary | ICD-10-CM | POA: Diagnosis not present

## 2013-12-09 DIAGNOSIS — R109 Unspecified abdominal pain: Secondary | ICD-10-CM | POA: Diagnosis not present

## 2013-12-09 DIAGNOSIS — R1011 Right upper quadrant pain: Secondary | ICD-10-CM | POA: Diagnosis not present

## 2013-12-09 DIAGNOSIS — K219 Gastro-esophageal reflux disease without esophagitis: Secondary | ICD-10-CM | POA: Diagnosis not present

## 2013-12-09 DIAGNOSIS — Z91040 Latex allergy status: Secondary | ICD-10-CM | POA: Diagnosis not present

## 2013-12-09 DIAGNOSIS — Z833 Family history of diabetes mellitus: Secondary | ICD-10-CM | POA: Diagnosis not present

## 2013-12-09 LAB — COMPREHENSIVE METABOLIC PANEL
ALBUMIN: 3.8 g/dL (ref 3.4–5.0)
ALK PHOS: 85 U/L
AST: 36 U/L (ref 15–37)
Anion Gap: 13 (ref 7–16)
BUN: 26 mg/dL — ABNORMAL HIGH (ref 7–18)
Bilirubin,Total: 0.7 mg/dL (ref 0.2–1.0)
CO2: 22 mmol/L (ref 21–32)
CREATININE: 1.44 mg/dL — AB (ref 0.60–1.30)
Calcium, Total: 8.9 mg/dL (ref 8.5–10.1)
Chloride: 90 mmol/L — ABNORMAL LOW (ref 98–107)
EGFR (African American): 43 — ABNORMAL LOW
EGFR (Non-African Amer.): 37 — ABNORMAL LOW
Glucose: 439 mg/dL — ABNORMAL HIGH (ref 65–99)
Osmolality: 275 (ref 275–301)
Potassium: 4.6 mmol/L (ref 3.5–5.1)
SGPT (ALT): 27 U/L (ref 12–78)
SODIUM: 125 mmol/L — AB (ref 136–145)
TOTAL PROTEIN: 6.8 g/dL (ref 6.4–8.2)

## 2013-12-09 LAB — CBC WITH DIFFERENTIAL/PLATELET
BASOS PCT: 0.3 %
Basophil #: 0 10*3/uL (ref 0.0–0.1)
EOS PCT: 0.2 %
Eosinophil #: 0 10*3/uL (ref 0.0–0.7)
HCT: 35.9 % (ref 35.0–47.0)
HGB: 11.7 g/dL — AB (ref 12.0–16.0)
LYMPHS ABS: 0.8 10*3/uL — AB (ref 1.0–3.6)
LYMPHS PCT: 8.4 %
MCH: 29.2 pg (ref 26.0–34.0)
MCHC: 32.7 g/dL (ref 32.0–36.0)
MCV: 89 fL (ref 80–100)
MONO ABS: 0.5 x10 3/mm (ref 0.2–0.9)
Monocyte %: 5.2 %
NEUTROS PCT: 85.9 %
Neutrophil #: 7.7 10*3/uL — ABNORMAL HIGH (ref 1.4–6.5)
Platelet: 79 10*3/uL — ABNORMAL LOW (ref 150–440)
RBC: 4.02 10*6/uL (ref 3.80–5.20)
RDW: 13.5 % (ref 11.5–14.5)
WBC: 9 10*3/uL (ref 3.6–11.0)

## 2013-12-09 LAB — LIPASE, BLOOD: Lipase: 67 U/L — ABNORMAL LOW (ref 73–393)

## 2013-12-09 LAB — TROPONIN I

## 2013-12-10 ENCOUNTER — Observation Stay: Payer: Self-pay | Admitting: Internal Medicine

## 2013-12-10 DIAGNOSIS — R1011 Right upper quadrant pain: Secondary | ICD-10-CM | POA: Diagnosis not present

## 2013-12-10 DIAGNOSIS — R1013 Epigastric pain: Secondary | ICD-10-CM | POA: Diagnosis not present

## 2013-12-10 DIAGNOSIS — R1110 Vomiting, unspecified: Secondary | ICD-10-CM | POA: Diagnosis not present

## 2013-12-10 DIAGNOSIS — E872 Acidosis, unspecified: Secondary | ICD-10-CM | POA: Diagnosis not present

## 2013-12-10 DIAGNOSIS — E119 Type 2 diabetes mellitus without complications: Secondary | ICD-10-CM | POA: Diagnosis not present

## 2013-12-10 DIAGNOSIS — E860 Dehydration: Secondary | ICD-10-CM | POA: Diagnosis not present

## 2013-12-10 DIAGNOSIS — K2900 Acute gastritis without bleeding: Secondary | ICD-10-CM | POA: Diagnosis not present

## 2013-12-10 DIAGNOSIS — N269 Renal sclerosis, unspecified: Secondary | ICD-10-CM | POA: Diagnosis not present

## 2013-12-10 DIAGNOSIS — N179 Acute kidney failure, unspecified: Secondary | ICD-10-CM | POA: Diagnosis not present

## 2013-12-10 LAB — BASIC METABOLIC PANEL
Anion Gap: 11 (ref 7–16)
BUN: 25 mg/dL — ABNORMAL HIGH (ref 7–18)
CALCIUM: 8.3 mg/dL — AB (ref 8.5–10.1)
CO2: 21 mmol/L (ref 21–32)
CREATININE: 1.64 mg/dL — AB (ref 0.60–1.30)
Chloride: 99 mmol/L (ref 98–107)
EGFR (African American): 37 — ABNORMAL LOW
EGFR (Non-African Amer.): 32 — ABNORMAL LOW
GLUCOSE: 362 mg/dL — AB (ref 65–99)
Osmolality: 282 (ref 275–301)
POTASSIUM: 3.7 mmol/L (ref 3.5–5.1)
Sodium: 131 mmol/L — ABNORMAL LOW (ref 136–145)

## 2013-12-10 LAB — BETA-HYDROXYBUTYRIC ACID: BETA-HYDROXYBUTYRATE: 29.7 mg/dL — AB (ref 0.2–2.8)

## 2013-12-10 LAB — URINALYSIS, COMPLETE
Bacteria: NONE SEEN
Bilirubin,UR: NEGATIVE
Blood: NEGATIVE
Glucose,UR: >=500 mg/dL (ref 0–75)
Hyaline Cast: 31
Leukocyte Esterase: NEGATIVE
NITRITE: NEGATIVE
Ph: 5 (ref 4.5–8.0)
Protein: NEGATIVE
RBC,UR: 3 /HPF (ref 0–5)
SPECIFIC GRAVITY: 1.014 (ref 1.003–1.030)
WBC UR: 8 /HPF (ref 0–5)

## 2013-12-10 LAB — MAGNESIUM: Magnesium: 1.4 mg/dL — ABNORMAL LOW

## 2013-12-11 DIAGNOSIS — E872 Acidosis, unspecified: Secondary | ICD-10-CM | POA: Diagnosis not present

## 2013-12-11 DIAGNOSIS — N179 Acute kidney failure, unspecified: Secondary | ICD-10-CM | POA: Diagnosis not present

## 2013-12-11 DIAGNOSIS — K2900 Acute gastritis without bleeding: Secondary | ICD-10-CM | POA: Diagnosis not present

## 2013-12-11 DIAGNOSIS — E119 Type 2 diabetes mellitus without complications: Secondary | ICD-10-CM | POA: Diagnosis not present

## 2013-12-11 LAB — CBC WITH DIFFERENTIAL/PLATELET
BASOS PCT: 0.4 %
Basophil #: 0 10*3/uL (ref 0.0–0.1)
EOS PCT: 0.5 %
Eosinophil #: 0.1 10*3/uL (ref 0.0–0.7)
HCT: 31 % — ABNORMAL LOW (ref 35.0–47.0)
HGB: 10.1 g/dL — AB (ref 12.0–16.0)
LYMPHS PCT: 12.9 %
Lymphocyte #: 1.4 10*3/uL (ref 1.0–3.6)
MCH: 28.9 pg (ref 26.0–34.0)
MCHC: 32.5 g/dL (ref 32.0–36.0)
MCV: 89 fL (ref 80–100)
MONO ABS: 0.7 x10 3/mm (ref 0.2–0.9)
MONOS PCT: 6.4 %
Neutrophil #: 8.4 10*3/uL — ABNORMAL HIGH (ref 1.4–6.5)
Neutrophil %: 79.8 %
Platelet: 93 10*3/uL — ABNORMAL LOW (ref 150–440)
RBC: 3.48 10*6/uL — ABNORMAL LOW (ref 3.80–5.20)
RDW: 14.4 % (ref 11.5–14.5)
WBC: 10.5 10*3/uL (ref 3.6–11.0)

## 2013-12-11 LAB — COMPREHENSIVE METABOLIC PANEL
ALBUMIN: 3 g/dL — AB (ref 3.4–5.0)
ALK PHOS: 60 U/L
ALT: 31 U/L (ref 12–78)
Anion Gap: 8 (ref 7–16)
BILIRUBIN TOTAL: 0.4 mg/dL (ref 0.2–1.0)
BUN: 29 mg/dL — ABNORMAL HIGH (ref 7–18)
CALCIUM: 8.5 mg/dL (ref 8.5–10.1)
CHLORIDE: 106 mmol/L (ref 98–107)
CREATININE: 1.61 mg/dL — AB (ref 0.60–1.30)
Co2: 24 mmol/L (ref 21–32)
GFR CALC AF AMER: 38 — AB
GFR CALC NON AF AMER: 33 — AB
Glucose: 145 mg/dL — ABNORMAL HIGH (ref 65–99)
Osmolality: 284 (ref 275–301)
POTASSIUM: 4.7 mmol/L (ref 3.5–5.1)
SGOT(AST): 47 U/L — ABNORMAL HIGH (ref 15–37)
SODIUM: 138 mmol/L (ref 136–145)
TOTAL PROTEIN: 5.6 g/dL — AB (ref 6.4–8.2)

## 2013-12-11 LAB — HEMOGLOBIN A1C: Hemoglobin A1C: 8.3 % — ABNORMAL HIGH (ref 4.2–6.3)

## 2013-12-11 LAB — TSH: Thyroid Stimulating Horm: 1.68 u[IU]/mL

## 2013-12-11 LAB — MAGNESIUM: MAGNESIUM: 2.3 mg/dL

## 2013-12-16 DIAGNOSIS — Z940 Kidney transplant status: Secondary | ICD-10-CM | POA: Diagnosis not present

## 2014-01-09 DIAGNOSIS — I10 Essential (primary) hypertension: Secondary | ICD-10-CM | POA: Diagnosis not present

## 2014-01-09 DIAGNOSIS — E1029 Type 1 diabetes mellitus with other diabetic kidney complication: Secondary | ICD-10-CM | POA: Diagnosis not present

## 2014-01-09 DIAGNOSIS — D696 Thrombocytopenia, unspecified: Secondary | ICD-10-CM | POA: Diagnosis not present

## 2014-01-09 DIAGNOSIS — D649 Anemia, unspecified: Secondary | ICD-10-CM | POA: Diagnosis not present

## 2014-01-09 DIAGNOSIS — Z940 Kidney transplant status: Secondary | ICD-10-CM | POA: Diagnosis not present

## 2014-01-09 DIAGNOSIS — IMO0002 Reserved for concepts with insufficient information to code with codable children: Secondary | ICD-10-CM | POA: Diagnosis not present

## 2014-01-09 DIAGNOSIS — E1142 Type 2 diabetes mellitus with diabetic polyneuropathy: Secondary | ICD-10-CM | POA: Diagnosis not present

## 2014-01-09 DIAGNOSIS — R112 Nausea with vomiting, unspecified: Secondary | ICD-10-CM | POA: Diagnosis not present

## 2014-01-13 DIAGNOSIS — Z940 Kidney transplant status: Secondary | ICD-10-CM | POA: Diagnosis not present

## 2014-01-15 ENCOUNTER — Ambulatory Visit (INDEPENDENT_AMBULATORY_CARE_PROVIDER_SITE_OTHER): Payer: Medicare Other | Admitting: Gynecology

## 2014-01-15 ENCOUNTER — Encounter: Payer: Self-pay | Admitting: Gynecology

## 2014-01-15 VITALS — BP 126/82

## 2014-01-15 DIAGNOSIS — N184 Chronic kidney disease, stage 4 (severe): Secondary | ICD-10-CM

## 2014-01-15 DIAGNOSIS — M8580 Other specified disorders of bone density and structure, unspecified site: Secondary | ICD-10-CM

## 2014-01-15 DIAGNOSIS — M899 Disorder of bone, unspecified: Secondary | ICD-10-CM

## 2014-01-15 DIAGNOSIS — R636 Underweight: Secondary | ICD-10-CM | POA: Diagnosis not present

## 2014-01-15 DIAGNOSIS — N951 Menopausal and female climacteric states: Secondary | ICD-10-CM | POA: Diagnosis not present

## 2014-01-15 DIAGNOSIS — M949 Disorder of cartilage, unspecified: Secondary | ICD-10-CM | POA: Diagnosis not present

## 2014-01-15 NOTE — Progress Notes (Signed)
   Patient is a 68 year old who was seen in the practice as a new patient on 10/10/2013. She was previously been followed by Dr. Ubaldo Glassing who has retired.Patient had a right kidney transplant in 2013 and is being followed by Dr. Justin Mend nephrologist. Patient has been a type I diabetic for 50 years and has been followed by Dr. Roque Cash endocrinologist.Her ophthalmologist is Dr. Ebony Cargo  Which she sees once a year.  The reason for today's consult visit is to discuss her recent bone density study done here in our office. Her previous bone density study was in 2012 at another facility with a different manufactured machine but the lowest T score was at the left femoral neck with a value of -1.3 (decreased bone mineral density) osteopenia.  Bone density study done here in our office 12/04/2013 demonstrated that the lowest T score was at the left femoral neck with a value -1.3. Essentially unchanged from her previous scan of 2004.A FRAX Analysis (10 year fracture risk) demonstrated the following:  Major  osteoporotic fracture in the next 10 years 12% (normal less than 20%) Hip fracture risk in the next 10 years 3.2% (normal less than 3%)  Based on these findings were borne masses 15% the low normal and her risk of hip fracture is 2.6 times greater in the general population.  We had a lengthy discussion of the risks benefits and pros and cons of antiresorptive agents to include osteonecrosis of the jaw and spontaneous subtrochanteric fractures. Since based on the FRAX analysis she exceeds the threshold for hip fracture as well as her history of type 1 diabetes, renal transplant, postmenopausal, low body mass index she will benefit with treatment. I'm going to send a copy of this office note to Dr. Justin Mend her nephrologist as per my recommendation to make sure that he sees no contraindication on starting her on Actonel 150 mg one by mouth q. monthly although it is excreted in the kidney. She stated that he has done  blood work recently so if her BUN and creatinine and calcium level are normal she may start this medication but we will a tear from him. Another alternative would be Prolia 60 mg subcutaneous Q6 months (monoclonal antibody metabolism and excretion unknown. We would then keep patient on a medication for no longer than 6-8 years and then go on for holiday and continue to monitor her bone mineral density. Literature information was provided and will manage accordingly.

## 2014-01-15 NOTE — Patient Instructions (Signed)
Risedronate tablets What is this medicine? RISEDRONATE (ris ED roe nate) reduces calcium loss from bones. It helps make healthy bone and to slow bone loss in patients with Paget's disease and osteoporosis. It may be used in others at risk for bone loss. This medicine may be used for other purposes; ask your health care provider or pharmacist if you have questions. COMMON BRAND NAME(S): Actonel What should I tell my health care provider before I take this medicine? They need to know if you have any of these conditions: -dental disease -esophagus, stomach, or intestine problems, like acid reflux or GERD -kidney disease -low blood calcium -problems sitting or standing for 30 minutes -trouble swallowing -an unusual or allergic reaction to risedronate, other medicines, foods, dyes, or preservatives -pregnant or trying to get pregnant -breast-feeding How should I use this medicine? You must take this medication exactly as directed or you will lower the amount of medicine you absorb into your body or you may cause your self harm. Take this medicine by mouth first thing in the morning, after you are up for the day. Do not eat or drink anything before you take this medicine. Swallow the tablets with a full glass (6 to 8 fluid ounces) of plain water. Do not take the tablets with any other drink. Do not chew or crush the tablet. After taking this medicine, do not eat breakfast, drink, or take any other medicines or vitamins for at least 30 minutes. Stand or sit up for at least 30 minutes after you take this medicine; do not lie down. Do not take your medicine more often than directed. Talk to your pediatrician regarding the use of this medicine in children. Special care may be needed. Overdosage: If you think you have taken too much of this medicine contact a poison control center or emergency room at once. NOTE: This medicine is only for you. Do not share this medicine with others. What if I miss a  dose? If you miss a dose, do not take it later in the day. Take your normal dose the next morning. Do not take double or extra doses. What may interact with this medicine? -antacids like aluminum hydroxide or magnesium hydroxide -aspirin -calcium supplements -iron supplements -NSAIDs, medicines for pain and inflammation, like ibuprofen or naproxen -thyroid hormones -vitamins with minerals This list may not describe all possible interactions. Give your health care provider a list of all the medicines, herbs, non-prescription drugs, or dietary supplements you use. Also tell them if you smoke, drink alcohol, or use illegal drugs. Some items may interact with your medicine. What should I watch for while using this medicine? Visit your doctor or health care professional for regular check ups. It may be some time before you see the benefit from this medicine. Your doctor or health care professional may order blood tests and other tests to see how you are doing. You should make sure you get enough calcium and vitamin D while you are taking this medicine, unless your doctor tells you not to. Discuss the foods you eat and the vitamins you take with your health care professional. Some people who take this medicine have severe bone, joint, and/or muscle pain. This medicine may also increase your risk for a broken thigh bone. Tell your doctor right away if you have pain in your upper leg or groin. Tell your doctor if you have any pain that does not go away or that gets worse. What side effects may I notice from receiving  this medicine? Side effects that you should report to your doctor as soon as possible: -allergic reactions such as skin rash or itching, hives, swelling of the face, lips, throat, or tongue -black or tarry stools -changes in vision -heartburn or stomach pain -jaw pain, especially after dental work -pain or difficulty when swallowing -redness, blistering, peeling, or loosening of the skin,  including inside the mouth Side effects that usually do not require medical attention (report to your doctor if they continue or are bothersome): -bone, muscle, or joint pain -changes in taste -diarrhea or constipation -eye pain or itching -headache -nausea or vomiting -stomach gas or fullness This list may not describe all possible side effects. Call your doctor for medical advice about side effects. You may report side effects to FDA at 1-800-FDA-1088. Where should I keep my medicine? Keep out of the reach of children. Store at room temperature between 20 and 25 degrees C (68 and 77 degrees F). Throw away any unused medicine after the expiration date. NOTE: This sheet is a summary. It may not cover all possible information. If you have questions about this medicine, talk to your doctor, pharmacist, or health care provider.  2015, Elsevier/Gold Standard. (2012-05-10 16:21:37)

## 2014-01-22 DIAGNOSIS — Z940 Kidney transplant status: Secondary | ICD-10-CM | POA: Diagnosis not present

## 2014-02-02 DIAGNOSIS — I6529 Occlusion and stenosis of unspecified carotid artery: Secondary | ICD-10-CM | POA: Diagnosis not present

## 2014-02-02 DIAGNOSIS — Z1331 Encounter for screening for depression: Secondary | ICD-10-CM | POA: Diagnosis not present

## 2014-02-02 DIAGNOSIS — E1065 Type 1 diabetes mellitus with hyperglycemia: Secondary | ICD-10-CM | POA: Diagnosis not present

## 2014-02-02 DIAGNOSIS — E1142 Type 2 diabetes mellitus with diabetic polyneuropathy: Secondary | ICD-10-CM | POA: Diagnosis not present

## 2014-02-02 DIAGNOSIS — D696 Thrombocytopenia, unspecified: Secondary | ICD-10-CM | POA: Diagnosis not present

## 2014-02-02 DIAGNOSIS — N2581 Secondary hyperparathyroidism of renal origin: Secondary | ICD-10-CM | POA: Diagnosis not present

## 2014-02-02 DIAGNOSIS — E1029 Type 1 diabetes mellitus with other diabetic kidney complication: Secondary | ICD-10-CM | POA: Diagnosis not present

## 2014-02-02 DIAGNOSIS — E11359 Type 2 diabetes mellitus with proliferative diabetic retinopathy without macular edema: Secondary | ICD-10-CM | POA: Diagnosis not present

## 2014-02-02 DIAGNOSIS — E785 Hyperlipidemia, unspecified: Secondary | ICD-10-CM | POA: Diagnosis not present

## 2014-02-11 ENCOUNTER — Encounter: Payer: Self-pay | Admitting: Family

## 2014-02-11 ENCOUNTER — Ambulatory Visit (HOSPITAL_BASED_OUTPATIENT_CLINIC_OR_DEPARTMENT_OTHER): Payer: Medicare Other | Admitting: Family

## 2014-02-11 ENCOUNTER — Other Ambulatory Visit (HOSPITAL_BASED_OUTPATIENT_CLINIC_OR_DEPARTMENT_OTHER): Payer: Medicare Other | Admitting: Lab

## 2014-02-11 VITALS — BP 176/55 | HR 53 | Temp 98.0°F | Resp 14 | Ht 65.0 in | Wt 119.0 lb

## 2014-02-11 DIAGNOSIS — D509 Iron deficiency anemia, unspecified: Secondary | ICD-10-CM

## 2014-02-11 DIAGNOSIS — T50905A Adverse effect of unspecified drugs, medicaments and biological substances, initial encounter: Principal | ICD-10-CM

## 2014-02-11 DIAGNOSIS — D6959 Other secondary thrombocytopenia: Secondary | ICD-10-CM

## 2014-02-11 LAB — CBC WITH DIFFERENTIAL (CANCER CENTER ONLY)
BASO#: 0.1 10*3/uL (ref 0.0–0.2)
BASO%: 1 % (ref 0.0–2.0)
EOS%: 7 % (ref 0.0–7.0)
Eosinophils Absolute: 0.4 10*3/uL (ref 0.0–0.5)
HCT: 38.3 % (ref 34.8–46.6)
HGB: 12.7 g/dL (ref 11.6–15.9)
LYMPH#: 1.2 10*3/uL (ref 0.9–3.3)
LYMPH%: 22.9 % (ref 14.0–48.0)
MCH: 29.3 pg (ref 26.0–34.0)
MCHC: 33.2 g/dL (ref 32.0–36.0)
MCV: 88 fL (ref 81–101)
MONO#: 0.4 10*3/uL (ref 0.1–0.9)
MONO%: 8.8 % (ref 0.0–13.0)
NEUT%: 60.3 % (ref 39.6–80.0)
NEUTROS ABS: 3 10*3/uL (ref 1.5–6.5)
Platelets: 112 10*3/uL — ABNORMAL LOW (ref 145–400)
RBC: 4.34 10*6/uL (ref 3.70–5.32)
RDW: 12.9 % (ref 11.1–15.7)
WBC: 5 10*3/uL (ref 3.9–10.0)

## 2014-02-11 LAB — CHCC SATELLITE - SMEAR

## 2014-02-11 NOTE — Progress Notes (Signed)
[name]  Telephone:(336) [phone] Fax:(336) [phone]  ID: Jasmine Cummings OB: 1945-09-19 MR#: [id number] VOZ#:[id number] Patient Care Team: Sheela Stack, MD as PCP - General (Endocrinology) Sheela Stack, MD (Endocrinology)  DIAGNOSIS: Chronic thrombocytopenia-likely medication related  Status post kidney transplant in March of 2013  INTERVAL HISTORY: Jasmine Cummings is here today for a follow-up. She is doing well. She recently had a virus with n/v and ended up dehydrated. Her creatinine did go up but has since corrected itself with extra fluid intake. She is feeling much better.  She had a bone marrow biopsy last year. This was pretty much unremarkable. Cytogenetics were normal. There is no flow cytometry evidence of a monoclonal population of cells. As such, it seems that her thrombocytopenia was related to her medications. She has some bruising but denies bleeding. She's eating well. She's not having problems with her blood sugars. She is on a different insulin regimen right now. She is under a lot of stress because of her husband's health. She denies fever, chills, headache, dizziness, SOB, chest pain, palpitations, abdominal pain, constipation, diarrhea, blood in urine or stool. She denies swelling, tenderness, numbness or tingling in her extremities. She is making sure to drink plenty of fluids.   CURRENT TREATMENT: Observation  REVIEW OF SYSTEMS: All other 10 point review of systems is negative.  PAST MEDICAL HISTORY: Past Medical History  Diagnosis Date  . Diabetes mellitus   . Chronic kidney disease   . Anemia    PAST SURGICAL HISTORY: Past Surgical History  Procedure Laterality Date  . Appendectomy    . Eye surgery  2006    Left eye diabetic retina detachment  . Kidney transplant Right 3/13  . Cesarean section  1968  . Tubal ligation  1981   FAMILY HISTORY Family History  Problem Relation Age of Onset  . Heart disease Sister   . Breast cancer  Sister   . Heart disease Brother   . Diabetes Brother   . Diabetes Brother    GYNECOLOGIC HISTORY:  No LMP recorded. Patient is postmenopausal.   SOCIAL HISTORY:  History   Social History  . Marital Status: Married    Spouse Name: N/A    Number of Children: N/A  . Years of Education: N/A   Occupational History  . Not on file.   Social History Main Topics  . Smoking status: Never Smoker   . Smokeless tobacco: Never Used     Comment: never used tobacco  . Alcohol Use: No  . Drug Use: No  . Sexual Activity: Not on file   Other Topics Concern  . Not on file   Social History Narrative  . No narrative on file   ADVANCED DIRECTIVES: <no information>  HEALTH MAINTENANCE: History  Substance Use Topics  . Smoking status: Never Smoker   . Smokeless tobacco: Never Used     Comment: never used tobacco  . Alcohol Use: No   Colonoscopy: PAP: Bone density: Lipid panel:  Allergies  Allergen Reactions  . Latex Hives    REACTION: swelling    Current Outpatient Prescriptions  Medication Sig Dispense Refill  . amLODipine (NORVASC) 10 MG tablet Take 10 mg by mouth at bedtime.      Marland Kitchen ezetimibe-simvastatin (VYTORIN) 10-20 MG per tablet Take 1 tablet by mouth at bedtime. Take Mon, Wed, Fri at [facility]      . FLUoxetine (PROZAC) 40 MG capsule Take 40 mg by mouth every morning.       Marland Kitchen  insulin aspart (NOVOLOG) 100 UNIT/ML injection Inject 2-10 Units into the skin 4 (four) times daily. Sliding Scale      . Insulin Glargine (LANTUS [place]) Inject 9 Units into the skin daily. 9 units in AM      . metoprolol tartrate (LOPRESSOR) 25 MG tablet Take 50 mg by mouth 2 (two) times daily.       . Multiple Vitamin (MULTIVITAMIN) capsule Take 1 capsule by mouth daily.        . mycophenolate (MYFORTIC) 180 MG EC tablet Take 180-360 mg by mouth 2 (two) times daily. Takes 2 in the morning and 1 at night      . NOVOFINE 32G X 6 MM MISC as needed.       Marland Kitchen omeprazole (PRILOSEC) 20 MG capsule Take 20 mg by  mouth daily.      Marland Kitchen sulfamethoxazole-trimethoprim (BACTRIM,SEPTRA) 400-80 MG per tablet Take 400 tablets by mouth. Take Mon, Wed, Fri one tablet      . tacrolimus (PROGRAF) 1 MG capsule Take 1-2 mg by mouth 2 (two) times daily. Takes 2 in the morning and 1 at night      . zinc gluconate 50 MG tablet Take 50 mg by mouth daily.       No current facility-administered medications for this visit.   OBJECTIVE: Filed Vitals:   02/11/14 0950  BP: 176/55  Pulse: 53  Temp: 98 F (36.7 C)  Resp: 14   Body mass index is 19.8 kg/(m^2). ECOG FS:0 - Asymptomatic Ocular: Sclerae unicteric, pupils equal, round and reactive to light Ear-nose-throat: Oropharynx clear, dentition fair Lymphatic: No cervical or supraclavicular adenopathy Lungs no rales or rhonchi, good excursion bilaterally Heart regular rate and rhythm, no murmur appreciated Abd soft, nontender, positive bowel sounds MSK no focal spinal tenderness, no joint edema Neuro: non-focal, well-oriented, appropriate affect Breasts: Deferred  LAB RESULTS: CMP     Component Value Date/Time   NA 131* 09/15/2010 0815   K 4.1 09/15/2010 0815   CL 97 09/15/2010 0815   CO2 26 09/15/2010 0815   GLUCOSE 255* 09/15/2010 0815   BUN 77* 09/15/2010 0815   CREATININE 3.68* 09/15/2010 0815   CALCIUM 8.7 09/15/2010 0815   ALBUMIN 3.4* 09/15/2010 0815   GFRNONAA 12* 09/15/2010 0815   GFRAA  Value: 15        The eGFR has been calculated using the MDRD equation. This calculation has not been validated in all clinical situations. eGFR's persistently <60 mL/min signify possible Chronic Kidney Disease.* 09/15/2010 0815   No results found for this basename: SPEP, UPEP,  kappa and lambda light chains   Lab Results  Component Value Date   WBC 5.0 02/11/2014   NEUTROABS 3.0 02/11/2014   HGB 12.7 02/11/2014   HCT 38.3 02/11/2014   MCV 88 02/11/2014   PLT 112* 02/11/2014   No results found for this basename: LABCA2   No components found with this basename: LABCA125    No results found for this basename: INR,  in the last 168 hours Urinalysis No results found for this basename: colorurine, appearanceur, labspec, phurine, glucoseu, hgbur, bilirubinur, ketonesur, proteinur, urobilinogen, nitrite, leukocytesur   STUDIES: No results found.  ASSESSMENT/PLAN: Jasmine Cummings is a 68 year old white female with thrombocytopenia. She had a bone marrow biopsy which did not show any malignancy or myelodysplastic changes. It appears that the thrombocytopenia is related to her medications for her kidney transplant. She is asymptomatic at this time and is doing quite well.  We will see  her back in 6 months for labs and follow-up.  She is in agreement with this and knows to call here with any questions or concerns. We can certainly see her sooner if need be.   Eliezer Bottom, NP 02/11/2014 10:30 AM

## 2014-02-17 DIAGNOSIS — Z940 Kidney transplant status: Secondary | ICD-10-CM | POA: Diagnosis not present

## 2014-03-17 DIAGNOSIS — Z940 Kidney transplant status: Secondary | ICD-10-CM | POA: Diagnosis not present

## 2014-03-18 DIAGNOSIS — E1065 Type 1 diabetes mellitus with hyperglycemia: Secondary | ICD-10-CM | POA: Diagnosis not present

## 2014-03-18 DIAGNOSIS — IMO0002 Reserved for concepts with insufficient information to code with codable children: Secondary | ICD-10-CM | POA: Diagnosis not present

## 2014-03-18 DIAGNOSIS — M810 Age-related osteoporosis without current pathological fracture: Secondary | ICD-10-CM | POA: Diagnosis not present

## 2014-03-18 DIAGNOSIS — Z940 Kidney transplant status: Secondary | ICD-10-CM | POA: Diagnosis not present

## 2014-03-18 DIAGNOSIS — E1029 Type 1 diabetes mellitus with other diabetic kidney complication: Secondary | ICD-10-CM | POA: Diagnosis not present

## 2014-03-18 DIAGNOSIS — I10 Essential (primary) hypertension: Secondary | ICD-10-CM | POA: Diagnosis not present

## 2014-04-17 DIAGNOSIS — Z23 Encounter for immunization: Secondary | ICD-10-CM | POA: Diagnosis not present

## 2014-04-22 DIAGNOSIS — Z940 Kidney transplant status: Secondary | ICD-10-CM | POA: Diagnosis not present

## 2014-04-25 ENCOUNTER — Other Ambulatory Visit: Payer: Self-pay | Admitting: Nurse Practitioner

## 2014-05-04 ENCOUNTER — Encounter: Payer: Self-pay | Admitting: Family

## 2014-05-19 DIAGNOSIS — Z940 Kidney transplant status: Secondary | ICD-10-CM | POA: Diagnosis not present

## 2014-05-19 DIAGNOSIS — E119 Type 2 diabetes mellitus without complications: Secondary | ICD-10-CM | POA: Diagnosis not present

## 2014-05-19 DIAGNOSIS — D631 Anemia in chronic kidney disease: Secondary | ICD-10-CM | POA: Diagnosis not present

## 2014-05-19 DIAGNOSIS — E785 Hyperlipidemia, unspecified: Secondary | ICD-10-CM | POA: Diagnosis not present

## 2014-05-20 DIAGNOSIS — Z940 Kidney transplant status: Secondary | ICD-10-CM | POA: Diagnosis not present

## 2014-06-08 DIAGNOSIS — F329 Major depressive disorder, single episode, unspecified: Secondary | ICD-10-CM | POA: Diagnosis not present

## 2014-06-08 DIAGNOSIS — Z940 Kidney transplant status: Secondary | ICD-10-CM | POA: Diagnosis not present

## 2014-06-08 DIAGNOSIS — D649 Anemia, unspecified: Secondary | ICD-10-CM | POA: Diagnosis not present

## 2014-06-08 DIAGNOSIS — E1142 Type 2 diabetes mellitus with diabetic polyneuropathy: Secondary | ICD-10-CM | POA: Diagnosis not present

## 2014-06-08 DIAGNOSIS — D696 Thrombocytopenia, unspecified: Secondary | ICD-10-CM | POA: Diagnosis not present

## 2014-06-08 DIAGNOSIS — E11359 Type 2 diabetes mellitus with proliferative diabetic retinopathy without macular edema: Secondary | ICD-10-CM | POA: Diagnosis not present

## 2014-06-08 DIAGNOSIS — N2581 Secondary hyperparathyroidism of renal origin: Secondary | ICD-10-CM | POA: Diagnosis not present

## 2014-06-08 DIAGNOSIS — I10 Essential (primary) hypertension: Secondary | ICD-10-CM | POA: Diagnosis not present

## 2014-06-09 DIAGNOSIS — Z940 Kidney transplant status: Secondary | ICD-10-CM | POA: Diagnosis not present

## 2014-06-09 DIAGNOSIS — H3570 Unspecified separation of retinal layers: Secondary | ICD-10-CM | POA: Diagnosis not present

## 2014-06-09 DIAGNOSIS — H3320 Serous retinal detachment, unspecified eye: Secondary | ICD-10-CM | POA: Diagnosis not present

## 2014-06-09 DIAGNOSIS — Z79899 Other long term (current) drug therapy: Secondary | ICD-10-CM | POA: Diagnosis not present

## 2014-06-09 DIAGNOSIS — E10351 Type 1 diabetes mellitus with proliferative diabetic retinopathy with macular edema: Secondary | ICD-10-CM | POA: Diagnosis not present

## 2014-06-17 DIAGNOSIS — E785 Hyperlipidemia, unspecified: Secondary | ICD-10-CM | POA: Diagnosis not present

## 2014-06-17 DIAGNOSIS — Z940 Kidney transplant status: Secondary | ICD-10-CM | POA: Diagnosis not present

## 2014-07-14 DIAGNOSIS — Z940 Kidney transplant status: Secondary | ICD-10-CM | POA: Diagnosis not present

## 2014-08-05 DIAGNOSIS — N184 Chronic kidney disease, stage 4 (severe): Secondary | ICD-10-CM | POA: Diagnosis not present

## 2014-08-05 DIAGNOSIS — I10 Essential (primary) hypertension: Secondary | ICD-10-CM | POA: Diagnosis not present

## 2014-08-05 DIAGNOSIS — Z6820 Body mass index (BMI) 20.0-20.9, adult: Secondary | ICD-10-CM | POA: Diagnosis not present

## 2014-08-05 DIAGNOSIS — E1021 Type 1 diabetes mellitus with diabetic nephropathy: Secondary | ICD-10-CM | POA: Diagnosis not present

## 2014-08-10 ENCOUNTER — Telehealth: Payer: Self-pay | Admitting: Gynecology

## 2014-08-10 DIAGNOSIS — M810 Age-related osteoporosis without current pathological fracture: Secondary | ICD-10-CM

## 2014-08-10 NOTE — Telephone Encounter (Signed)
Phone call from patient this am. Requesting Prolia injection to be given at [facility]> Her last injection was given at Dr Forde Dandy office . He stated per patient that the office no longer offers  patient's to receive their injections. She had received ok for meds through Dr Forde Dandy and her nephrologist per your office note in July. Dr Forde Dandy suggested that patient have injection at his office in Oct 2015 which patient did now her next injection is due in March 2016.

## 2014-08-10 NOTE — Telephone Encounter (Signed)
She can begin receiving the Prolia here 60 mg every 6 months. She will need to have her BUN/creatinine as well as calcium and vitamin D drawn before she gets her injection. Measure that she checks also with Dr. Forde Dandy and inform his office and then we will be administering adhere since he will no longer administered at his office

## 2014-08-14 ENCOUNTER — Telehealth: Payer: Self-pay | Admitting: Hematology & Oncology

## 2014-08-14 ENCOUNTER — Ambulatory Visit: Payer: Medicare Other | Admitting: Hematology & Oncology

## 2014-08-14 ENCOUNTER — Other Ambulatory Visit: Payer: Medicare Other | Admitting: Lab

## 2014-08-14 NOTE — Telephone Encounter (Signed)
Left message with pt's sister Olegario Shearer for pt to call.

## 2014-08-18 ENCOUNTER — Telehealth: Payer: Self-pay | Admitting: Hematology & Oncology

## 2014-08-18 NOTE — Telephone Encounter (Signed)
Patient called and resch 08/14/14 missed apt for 09/28/14

## 2014-08-20 NOTE — Telephone Encounter (Signed)
Phone call with instructions from Dr Toney Rakes, patient has BUN/Creatinine and calcium every month at [facility] due to Renal Transplant. She will call after her bloodwork. She will check on the Vit D to see if she had this at Dr Forde Dandy office and will also check on the date of last Prolia injection. All insurance information has been sent to [employer] to check benefits.

## 2014-08-21 DIAGNOSIS — Z79899 Other long term (current) drug therapy: Secondary | ICD-10-CM | POA: Diagnosis not present

## 2014-08-21 DIAGNOSIS — Z940 Kidney transplant status: Secondary | ICD-10-CM | POA: Diagnosis not present

## 2014-08-24 ENCOUNTER — Encounter: Payer: Self-pay | Admitting: Gynecology

## 2014-08-25 ENCOUNTER — Other Ambulatory Visit: Payer: Self-pay

## 2014-08-25 ENCOUNTER — Telehealth: Payer: Self-pay

## 2014-08-25 DIAGNOSIS — Z1231 Encounter for screening mammogram for malignant neoplasm of breast: Secondary | ICD-10-CM

## 2014-08-25 NOTE — Telephone Encounter (Signed)
I called patient because we received labs that another doctor ordered and Dr. Moshe Salisbury not sure why we received it.  He wanted to be sure patient addressed the abnormal glucose with her PCP.  Patient tells me that this is a monthly test she has drawn but that she had it sent because she had a conversation with someone here about a "shot for my bone and she needed my BUN/Creatining results" so she had them send [place] a copy of it.  I explained that I did not see that note in her chart but that Pam works with the Prolia injections and other bone related things and I will check with her and see if she was the one who needed it and let her know it is in there and she will get back with her regarding what to do about the injection.

## 2014-08-26 NOTE — Telephone Encounter (Signed)
Jasmine Cummings does have a note in her chart but we learned today that Jasmine Cummings's documentation is under [facility] and does not show up in the regular encounters without opening "show all" to be able to see her entries.  Jasmine Cummings will handle from here.

## 2014-08-26 NOTE — Telephone Encounter (Signed)
Received labs for patient BUN  21, Creatinine 1.00   Calcium 9.0  Will call pt regarding Vit D level per Dr Toney Rakes. Labs dated 08/21/2014

## 2014-08-31 ENCOUNTER — Ambulatory Visit
Admission: RE | Admit: 2014-08-31 | Discharge: 2014-08-31 | Disposition: A | Discharge status: Home / Self Care | Payer: Medicare Other | Source: Ambulatory Visit

## 2014-08-31 DIAGNOSIS — Z1231 Encounter for screening mammogram for malignant neoplasm of breast: Secondary | ICD-10-CM | POA: Diagnosis not present

## 2014-08-31 NOTE — Telephone Encounter (Addendum)
Phone call to patient needs to have Vit d level drawn. Left detailed message on voicemail and with husband. Pt to call back with information regarding lab to use for her blood work. Patient last Prolia injection is due after March 16. She will bring in documentation of the previous injection  date.

## 2014-09-01 NOTE — Telephone Encounter (Signed)
Pt called and explained the Vit D level to her. She can have labs done at [facility]. She will have Vit D level done when we give her injection , waiting on insurance.

## 2014-09-08 NOTE — Telephone Encounter (Signed)
Phone call to patient regarding her benefits from insurance Deductible  $166.00 ( met), 20% co pay ($200) secondary insurance will cover 100% of co pay cost to patient $0.  Asked her to call back and give me a date to come in for injection after 09/16/14.

## 2014-09-10 NOTE — Telephone Encounter (Signed)
Patient called wants injection week of 09/21/2014. Rosemarie Ax will schedule  Next injection due after 03/31/2015

## 2014-09-23 ENCOUNTER — Other Ambulatory Visit (INDEPENDENT_AMBULATORY_CARE_PROVIDER_SITE_OTHER): Payer: Medicare Other | Admitting: Anesthesiology

## 2014-09-23 DIAGNOSIS — M810 Age-related osteoporosis without current pathological fracture: Secondary | ICD-10-CM

## 2014-09-23 MED ORDER — DENOSUMAB 60 MG/ML ~~LOC~~ SOLN
60.0000 mg | Freq: Once | SUBCUTANEOUS | Status: AC
  Administered 2014-09-23: 60 mg via SUBCUTANEOUS

## 2014-09-28 ENCOUNTER — Encounter: Payer: Self-pay | Admitting: Family

## 2014-09-28 ENCOUNTER — Ambulatory Visit (HOSPITAL_BASED_OUTPATIENT_CLINIC_OR_DEPARTMENT_OTHER): Payer: Medicare Other | Admitting: Family

## 2014-09-28 ENCOUNTER — Other Ambulatory Visit (HOSPITAL_BASED_OUTPATIENT_CLINIC_OR_DEPARTMENT_OTHER): Payer: Medicare Other

## 2014-09-28 VITALS — BP 156/64 | HR 54 | Temp 97.9°F | Resp 14 | Ht 65.0 in | Wt 123.0 lb

## 2014-09-28 DIAGNOSIS — D696 Thrombocytopenia, unspecified: Secondary | ICD-10-CM

## 2014-09-28 DIAGNOSIS — Z940 Kidney transplant status: Secondary | ICD-10-CM

## 2014-09-28 DIAGNOSIS — D509 Iron deficiency anemia, unspecified: Secondary | ICD-10-CM

## 2014-09-28 DIAGNOSIS — N184 Chronic kidney disease, stage 4 (severe): Secondary | ICD-10-CM

## 2014-09-28 LAB — CBC WITH DIFFERENTIAL (CANCER CENTER ONLY)
BASO#: 0 10*3/uL (ref 0.0–0.2)
BASO%: 0.8 % (ref 0.0–2.0)
EOS ABS: 0.5 10*3/uL (ref 0.0–0.5)
EOS%: 9.4 % — ABNORMAL HIGH (ref 0.0–7.0)
HEMATOCRIT: 36.2 % (ref 34.8–46.6)
HEMOGLOBIN: 12.1 g/dL (ref 11.6–15.9)
LYMPH#: 1.4 10*3/uL (ref 0.9–3.3)
LYMPH%: 27.5 % (ref 14.0–48.0)
MCH: 27.9 pg (ref 26.0–34.0)
MCHC: 33.4 g/dL (ref 32.0–36.0)
MCV: 84 fL (ref 81–101)
MONO#: 0.6 10*3/uL (ref 0.1–0.9)
MONO%: 11.6 % (ref 0.0–13.0)
NEUT#: 2.5 10*3/uL (ref 1.5–6.5)
NEUT%: 50.7 % (ref 39.6–80.0)
Platelets: 123 10*3/uL — ABNORMAL LOW (ref 145–400)
RBC: 4.33 10*6/uL (ref 3.70–5.32)
RDW: 13.6 % (ref 11.1–15.7)
WBC: 4.9 10*3/uL (ref 3.9–10.0)

## 2014-09-28 LAB — CHCC SATELLITE - SMEAR

## 2014-09-28 NOTE — Progress Notes (Signed)
Hematology and Oncology Follow Up Visit  Jasmine Cummings [id number] [birth year] 69 y.o. 09/28/2014   Principle Diagnosis:  Chronic thrombocytopenia-likely medication related  Status post kidney transplant in March of 2013  Current Therapy:   Observation    Interim History: Jasmine Cummings is here today for a follow-up. She is doing well.   Her bone marrow biopsy in September 2014 showed cytogenetics were normal. She did trip and fall in December but thankfully she didn't break anything. Her knees still bother her a little but are improving.   She denies fever, chills, headache, dizziness, SOB, chest pain, palpitations, abdominal pain, constipation, diarrhea, blood in urine or stool. She is tired at times. No episodes of bleeding.  Her BP is up and she states that she has been having headaches and some dizziness. She is going to call her nephrologist and discuss adjusting her BP medications.   She denies swelling, tenderness, numbness or tingling in her extremities. No new aches or pains.  She's eating well and staying hydrated. She's not having problems with her blood sugars.  Medications:    Medication List       This list is accurate as of: 09/28/14  8:54 AM.  Always use your most recent med list.               amLODipine 10 MG tablet  Commonly known as:  NORVASC  Take 10 mg by mouth at bedtime.     ezetimibe-simvastatin 10-20 MG per tablet  Commonly known as:  VYTORIN  Take 1 tablet by mouth at bedtime. Take Mon, Wed, Fri at [facility]     FLUoxetine 40 MG capsule  Commonly known as:  PROZAC  Take 40 mg by mouth every morning.     insulin aspart 100 UNIT/ML injection  Commonly known as:  novoLOG  Inject 2-10 Units into the skin 4 (four) times daily. Sliding Scale     LANTUS [place]  Inject 9 Units into the skin daily. 9 units in AM     metoprolol tartrate 25 MG tablet  Commonly known as:  LOPRESSOR  Take 50 mg by mouth 2 (two) times daily.     multivitamin capsule  Take 1  capsule by mouth daily.     mycophenolate 180 MG EC tablet  Commonly known as:  MYFORTIC  Take 180-360 mg by mouth 2 (two) times daily. Takes 2 in the morning and 1 at night     NOVOFINE 32G X 6 MM Misc  Generic drug:  Insulin Pen Needle  as needed.     omeprazole 20 MG capsule  Commonly known as:  PRILOSEC  Take 20 mg by mouth daily.     sulfamethoxazole-trimethoprim 400-80 MG per tablet  Commonly known as:  BACTRIM,SEPTRA  Take 400 tablets by mouth. Take Mon, Wed, Fri one tablet     tacrolimus 1 MG capsule  Commonly known as:  PROGRAF  Take 1-2 mg by mouth 2 (two) times daily. Takes 2 in the morning and 1 at night     zinc gluconate 50 MG tablet  Take 50 mg by mouth daily.        Allergies:  Allergies  Allergen Reactions  . Latex Hives    REACTION: swelling    Past Medical History, Surgical history, Social history, and Family History were reviewed and updated.  Review of Systems: All other 10 point review of systems is negative.   Physical Exam:  vitals were not taken for this visit.  Wt  Readings from Last 3 Encounters:  02/11/14 119 lb (53.978 kg)  10/09/13 123 lb (55.792 kg)  08/14/13 121 lb (54.885 kg)    Ocular: Sclerae unicteric, pupils equal, round and reactive to light Ear-nose-throat: Oropharynx clear, dentition fair Lymphatic: No cervical or supraclavicular adenopathy Lungs no rales or rhonchi, good excursion bilaterally Heart regular rate and rhythm, no murmur appreciated Abd soft, nontender, positive bowel sounds MSK no focal spinal tenderness, no joint edema Neuro: non-focal, well-oriented, appropriate affect Breasts: Deferred  Lab Results  Component Value Date   WBC 4.9 09/28/2014   HGB 12.1 09/28/2014   HCT 36.2 09/28/2014   MCV 84 09/28/2014   PLT 123* 09/28/2014   Lab Results  Component Value Date   FERRITIN 203 08/24/2011   IRON 115 08/24/2011   TIBC 355 08/24/2011   UIBC 240 08/24/2011   IRONPCTSAT 32 08/24/2011   Lab  Results  Component Value Date   RBC 4.33 09/28/2014   No results found for: KPAFRELGTCHN, LAMBDASER, KAPLAMBRATIO No results found for: IGGSERUM, IGA, IGMSERUM No results found for: Ronnald Ramp, A1GS, A2GS, Tillman Sers, SPEI   Chemistry      Component Value Date/Time   NA 131* 09/15/2010 0815   K 4.1 09/15/2010 0815   CL 97 09/15/2010 0815   CO2 26 09/15/2010 0815   BUN 77* 09/15/2010 0815   CREATININE 3.68* 09/15/2010 0815      Component Value Date/Time   CALCIUM 8.7 09/15/2010 0815     Impression and Plan: Jasmine Cummings is a 69 year old white female with thrombocytopenia. She had a bone marrow biopsy in September 2014 which did not show any malignancy or myelodysplastic changes.  She is asymptomatic at this time and doing well. She enjoyed Easter and cooking for her family.  Her platelet count continues to improve. No episodes of bleeding.  She is going to follow-up with nephrology regarding her headaches, dizziness and elevated BP.  We will see her back in 8 months for labs and follow-up.  She is in agreement with this and knows to call here with any questions or concerns. We can certainly see her sooner if need be.   Eliezer Bottom, NP 3/28/20168:54 AM

## 2014-09-29 NOTE — Telephone Encounter (Signed)
Prolia injection given 09/23/14. Next Prolia injection will be due after 03/27/15. Patient did not have her lab work the day of injection as instructed per JF. No Vit D level done. Patient will return in for Vit D level .

## 2014-10-02 ENCOUNTER — Other Ambulatory Visit: Payer: Medicare Other

## 2014-10-08 DIAGNOSIS — Z48298 Encounter for aftercare following other organ transplant: Secondary | ICD-10-CM | POA: Diagnosis not present

## 2014-10-08 DIAGNOSIS — I10 Essential (primary) hypertension: Secondary | ICD-10-CM | POA: Diagnosis not present

## 2014-10-08 DIAGNOSIS — E119 Type 2 diabetes mellitus without complications: Secondary | ICD-10-CM | POA: Diagnosis not present

## 2014-10-08 DIAGNOSIS — Z794 Long term (current) use of insulin: Secondary | ICD-10-CM | POA: Diagnosis not present

## 2014-10-08 DIAGNOSIS — D899 Disorder involving the immune mechanism, unspecified: Secondary | ICD-10-CM | POA: Diagnosis not present

## 2014-10-08 DIAGNOSIS — Z940 Kidney transplant status: Secondary | ICD-10-CM | POA: Diagnosis not present

## 2014-10-08 DIAGNOSIS — Z79899 Other long term (current) drug therapy: Secondary | ICD-10-CM | POA: Diagnosis not present

## 2014-10-12 ENCOUNTER — Encounter: Payer: Medicare Other | Admitting: Gynecology

## 2014-10-13 DIAGNOSIS — M859 Disorder of bone density and structure, unspecified: Secondary | ICD-10-CM | POA: Diagnosis not present

## 2014-10-13 DIAGNOSIS — E785 Hyperlipidemia, unspecified: Secondary | ICD-10-CM | POA: Diagnosis not present

## 2014-10-13 DIAGNOSIS — E11359 Type 2 diabetes mellitus with proliferative diabetic retinopathy without macular edema: Secondary | ICD-10-CM | POA: Diagnosis not present

## 2014-10-13 DIAGNOSIS — E1021 Type 1 diabetes mellitus with diabetic nephropathy: Secondary | ICD-10-CM | POA: Diagnosis not present

## 2014-10-13 DIAGNOSIS — I6523 Occlusion and stenosis of bilateral carotid arteries: Secondary | ICD-10-CM | POA: Diagnosis not present

## 2014-10-13 DIAGNOSIS — Z681 Body mass index (BMI) 19 or less, adult: Secondary | ICD-10-CM | POA: Diagnosis not present

## 2014-10-13 DIAGNOSIS — Z940 Kidney transplant status: Secondary | ICD-10-CM | POA: Diagnosis not present

## 2014-10-13 DIAGNOSIS — D696 Thrombocytopenia, unspecified: Secondary | ICD-10-CM | POA: Diagnosis not present

## 2014-10-13 DIAGNOSIS — I10 Essential (primary) hypertension: Secondary | ICD-10-CM | POA: Diagnosis not present

## 2014-10-13 DIAGNOSIS — R6889 Other general symptoms and signs: Secondary | ICD-10-CM | POA: Diagnosis not present

## 2014-10-13 DIAGNOSIS — D649 Anemia, unspecified: Secondary | ICD-10-CM | POA: Diagnosis not present

## 2014-10-13 DIAGNOSIS — E1142 Type 2 diabetes mellitus with diabetic polyneuropathy: Secondary | ICD-10-CM | POA: Diagnosis not present

## 2014-10-14 ENCOUNTER — Other Ambulatory Visit: Payer: Self-pay | Admitting: *Deleted

## 2014-10-14 ENCOUNTER — Encounter: Payer: Self-pay | Admitting: Gynecology

## 2014-10-14 DIAGNOSIS — N184 Chronic kidney disease, stage 4 (severe): Secondary | ICD-10-CM

## 2014-10-14 DIAGNOSIS — M810 Age-related osteoporosis without current pathological fracture: Secondary | ICD-10-CM

## 2014-10-14 DIAGNOSIS — D696 Thrombocytopenia, unspecified: Secondary | ICD-10-CM

## 2014-10-24 NOTE — Discharge Summary (Signed)
PATIENT NAME:  Jasmine Cummings, Jasmine Cummings MR#:  [id number] DATE OF BIRTH:  09-16-1945  DATE OF ADMISSION:  12/10/2013 DATE OF DISCHARGE:  12/11/2013  PRIMARY CARE PHYSICIAN:  Nonlocal.  DISCHARGE DIAGNOSES: 1.  Acute gastritis.  2.  Acute renal failure.  3.  History of kidney transplant, hypertension, diabetes.   CONDITION:  Stable.   CODE STATUS:  FULL CODE.   HOME MEDICATIONS:  Please refer to the medication reconciliation list.   DIET:  Low-sodium, low-fat, low-cholesterol, ADA diet.   ACTIVITY: As tolerated.   FOLLOWUP CARE: Follow up with PCP within 1 to 2 weeks.  The patient nephrology was following within 1 to 2 weeks, as soon as possible. The patient needs follow up BMP with her nephrologist.   REASON FOR ADMISSION:  Epigastric abdominal pain associated with nausea and vomiting.   HOSPITAL COURSE: The patient is a 69 year old Caucasian female with a history of hypertension, diabetes and kidney transplant, presented to the ED with epigastric pain with nausea and vomiting. In addition, the patient's blood sugar was high at 456. Sodium was low at 125, creatinine increased to 1.64.  For detailed history and physical examination, please refer to the admission note dictated by Dr. Margaretmary Eddy.  1.  Acute gastritis. After admission, the patient was kept n.p.o. with IV fluid support, Zofran p.r.n.  The patient's symptoms have much improved on the 2nd day. Also, abdominal pain is controlled with morphine p.r.n.  The patient's symptoms have much improved. She is placed on hypertension, diabetes diet. The patient's symptoms have much improved.  2.  Acute renal failure. The patient was rehydrated with IV fluids and encouraged oral fluid intake; however, the patient's creatinine is still at 1.61. The patient wants to go home today. I advised the patient to follow up her BMP with nephrology as soon as possible and encouraged oral fluid intake.  3.  Hyponatremia, improved with normal saline IV.  4.  Diabetes  with hyperglycemia. After admission, the patient had been treated with sliding scale and Levemir. Blood sugar is under control.   The patient is clinically stable and will be discharged to home today. I discussed the patient's discharge plan with the patient, nurse and case manager.   TIME SPENT: About 35 minutes.   ____________________________ Demetrios Loll, MD qc:dmm D: 12/11/2013 16:36:54 ET T: 12/11/2013 18:25:57 ET JOB#: [id number]  cc: Demetrios Loll, MD, <Dictator> Demetrios Loll MD ELECTRONICALLY SIGNED 12/12/2013 15:28

## 2014-10-24 NOTE — H&P (Signed)
PATIENT NAME:  Jasmine Cummings, Jasmine Cummings MR#:  [id number] DATE OF BIRTH:  01-09-1946  DATE OF ADMISSION:  12/09/2013  PRIMARY CARE PHYSICIAN: Nonlocal.   REFERRING PHYSICIAN: Dr. Owens Shark.   CHIEF COMPLAINT: Epigastric abdominal pain associated with nausea, vomiting.   HISTORY OF PRESENT ILLNESS: The patient is a 69 year old female presenting to the ER with a two day history of acute epigastric abdominal pain associated with intractable nausea and vomiting. According to the daughter at bedside the patient is in her usual state of health until two days ago. No recent history of travel. Denies any new foods. She has history of acid reflex and starting with 1 or 2 episodes of diarrhea two days ago. Subsequently, she started having epigastric abdominal pain radiating to the middle of the chest, which was burning in nature. Then, she started having intractable nausea and vomiting. Feeling dizzy and weak. The patient is also feeling tired. As the patient is feeling weak and dizzy she is brought into the ER. A CAT scan of the abdomen and pelvis is done, which has revealed some biliary sludge and showed transplanted kidney with kidney stones. Initially, the patient is hyperglycemic with Accu-Chek at 456, sodium at 125  Subsequently, after giving IV fluid boluses her blood sugar went down to 362 and sodium went up to 131. Adrenal function is abnormal with a creatinine at 1.64. The patient was aggressively hydrated with IV fluids and antinausea medication was given and morphine was given, but the patient's condition significantly not improved and says she is still nauseated and vomiting. Hospitalist team is called to admit the patient. During my examination, the patient is still nauseous and vomiting. Daughter is at bedside. Denies any similar complaints in the past. No fever. No sick contacts. Diarrhea is significantly resolved.   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 Acid reflex.   PAST SURGICAL HISTORY: Renal transplantation.   ALLERGIES: LATEX.   PSYCHOSOCIAL HISTORY: Lives at home with husband. No history of smoking, alcohol or illicit drug usage.   FAMILY HISTORY: Diabetes runs in her family.   HOME MEDICATIONS: Tacrolimus 1 mg 2 capsules p.o. daily in the a.m. Omeprazole 20 mg 1 capsule p.o. once daily, NovoLog sliding scale, Norco 325/5, 1 to 2 tablets every six hours as needed for pain. Magnesium oxide 1 tablet p.o. once daily, Lantus 9 units subcutaneously in the morning 3 units in the evening, labetalol 100 mg p.o. 2 times a day, Floxin 40 mg 1 capsule p.o. once daily, Colace 100 mg p.o. 2 times a day, aspirin 81 mg once daily, amlodipine 10 mg p.o. once daily.   REVIEW OF SYSTEMS: CONSTITUTIONAL: Denies any fever, but complaining of fatigue and weakness.  EYES: Denies blurry vision, double, glaucoma.  ENT: Denies epistaxis, discharge, tinnitus.  RESPIRATION: Denies cough, chronic obstructive pulmonary disease, dyspnea.  CARDIOVASCULAR: No chest pain, palpitations.  GASTROINTESTINAL: Complaining of intractable nausea, vomiting. Had diarrhea, which is resolved. Complaining of epigastric abdominal pain, which is burning in nature, radiating to the midsternal area and underneath the ribs. Denies any hematemesis or melena.  GENITOURINARY: No dysuria, hematuria, renal colic, has renal calculi in the transplanted kidney but they are not obstructing.  GYNECOLOGICAL AND BREASTS: Denies breast mass or vaginal discharge.  ENDOCRINE: Denies polyuria, nocturia, thyroid problems.  HEMATOLOGIC: No anemia, easy bruising, bleeding.  INTEGUMENTARY: No acne, rash, lesions.  MUSCULOSKELETAL: No joint pain in the neck and back. Denies gout.  NEUROLOGIC: Denies vertigo, ataxia. Complaining of dizziness. Denies any dementia.  PSYCHIATRIC: No ADD, OCD, insomnia.   PHYSICAL EXAMINATION: VITAL  SIGNS: Temperature 98.2, pulse 77, respirations 18, blood pressure 140/83, pulse oximetry is 96%.  GENERAL APPEARANCE: Not in acute distress. Moderately built and nourished.   HEENT: Normocephalic, atraumatic. Pupils are equally reacting to light and accommodation. No scleral icterus. No conjunctival injection. No sinus tenderness. No postnasal drip. Dry mucous membranes.  NECK: Supple. No JVD. No thyromegaly. Range of motion is intact.  LUNGS: Clear to auscultation bilaterally. No accessory muscle use. No anterior chest wall tenderness on palpation.  CARDIAC: S1, S2 normal. Regular rate and rhythm. No murmurs.  GASTROINTESTINAL: Soft. Bowel sounds are positive in all four quadrants. Epigastric tenderness is present and also the patient is tender in the midsternal area. No rebound tenderness. No masses felt.  NEUROLOGIC: Awake, alert, oriented x 3. Cranial nerves II through XII are grossly intact. Motor and sensory are intact. Reflexes are 2+.  EXTREMITIES: No edema. No cyanosis. No clubbing.  SKIN: Warm to touch, dry in nature. Decreased turgor. No rashes. No lesions.  MUSCULOSKELETAL: No joint effusion, tenderness, or erythema.  PSYCHIATRIC: Normal mood and affect.   LABORATORIES AND IMAGING STUDIES: EKG - normal sinus rhythm with  left axis deviation, no acute ST-T wave changes. WBC 9.0, hemoglobin 11.7, hematocrit 35.9, platelet count is at 79,000. Troponin is 7.02. Accu-Chek 433, glucose 362, BUN 27. Sodium 131  , chloride 99, CO2 21 and anion gap is 11. GFR is 32, serum osmolality normal , calcium 8.3, pH is 7.24 with pCO2 31, pO2 88, FiO2 21%, bicarbonate is 13.3. CAT scan of the abdomen and pelvis without contrast has revealed extensive vascular calcifications, probable cholelithiasis or gallbladder sludge. Atrophy of the native kidneys with right pelvic renal transplant kidney. Possible stones without obstruction in the transplanted kidney.   ASSESSMENT AND PLAN: A 69 year old Caucasian  female presenting to the ER with a chief complaint of epigastric abdominal pain associated with nausea, vomiting which are intractable nature. Had 2 to  3 episodes of diarrhea two days ago, which has resolved.   ASSESSMENT AND PLAN: 1. Acute epigastric abdominal pain with acute gastritis associated with intractable nausea and vomiting. We will admit her to off unit telemetry and keep her n.p.o. Provide additional hydration with IV fluids. Provide IV Protonix and antiemetics. Pain management with IV morphine. If necessary, we will put surgical consult, but LFTs are normal at this time.  2. Acute renal insufficiency, probably prerenal in nature from dehydration. We will provide additional hydration with IV fluids.  3. History of diabetes mellitus with hyperglycemia. The patient has metabolic acidosis, but anion gap is closed. We will provide aggressive hydration with IV fluids and sliding scale insulin. If necessary, we will start her on insulin drip.  4. History of kidney transplantation on the right side. We will monitor renal function closely.  5. Epigastric abdominal pain, with biliary sludge. LFTs are normal. We will defer surgical consult at this time as epigastric abdominal pain is assumed to be from acute gastritis, most likely related to acid reflux or peptic ulcer disease. If there is no improvement after giving IV fluids and antiemetics, we will consider surgical consult. We will provide her gastrointestinal and deep vein thrombosis prophylaxis.   Plan of care was discussed in detail with the patient and her daughter at bedside. They both verbalized understanding of the plan. CODE STATUS: She is full code. Daughter is the medical power of  attorney.   TOTAL TIME SPENT ON ADMISSION: 50 minutes.      ____________________________ Nicholes Mango, MD ag:sg D: 12/10/2013 07:53:27 ET T: 12/10/2013 08:35:38 ET JOB#: [id number]  cc: Nicholes Mango, MD, <Dictator> Primary Care Physician  Nicholes Mango  MD ELECTRONICALLY SIGNED 12/19/2013 0:47

## 2014-11-04 ENCOUNTER — Encounter: Payer: Self-pay | Admitting: Gynecology

## 2014-11-04 ENCOUNTER — Ambulatory Visit (INDEPENDENT_AMBULATORY_CARE_PROVIDER_SITE_OTHER): Payer: Medicare Other | Admitting: Gynecology

## 2014-11-04 VITALS — BP 140/76 | Ht 65.0 in | Wt 126.0 lb

## 2014-11-04 DIAGNOSIS — N9089 Other specified noninflammatory disorders of vulva and perineum: Secondary | ICD-10-CM

## 2014-11-04 DIAGNOSIS — M8580 Other specified disorders of bone density and structure, unspecified site: Secondary | ICD-10-CM | POA: Diagnosis not present

## 2014-11-04 DIAGNOSIS — Z01419 Encounter for gynecological examination (general) (routine) without abnormal findings: Secondary | ICD-10-CM | POA: Diagnosis not present

## 2014-11-04 NOTE — Progress Notes (Signed)
Jasmine Cummings June 06, 1946 [id number]   History:    69 y.o.  for annual gyn exam who presented with complaint of a vulvar irritation and spotting. Patient was seen last year as a new patient to the practice she was formerly been followed by Dr. Ubaldo Glassing. She had not had a gynecological examination since 2013. Review of her record indicated she had a kidney transplant in 2013 and is currently followed by Dr. Justin Mend her nephrologist. She also suffers from type 1 diabetes where she is under the care of Dr. Roque Cash endocrinologist.Her ophthalmologist is Dr. Ebony Cargo which cc several times a year.Patient stated her last colonoscopy was in 2011 which was normal. She had her mammogram this year. Patient denies any prior history of abnormal Pap smear. Last Pap smear 2012. The patient's several years ago had been on hormone replacement therapy and currently not on any HRT. The patient has complained of on and off vasomotor symptoms and vaginal dryness. Patient states that her shingles and Tdap vaccine are up-to-date.  Patient had a bone density study done in our office last year which demonstrated that the lowest T score was -1.3 at the left femoral neck. Her Frax analysis indicated that she was above threshold asked to risk of hip fracture of 3.2% over the course of the next 10 years. For this reason she was started on Prolia. She reports her last injection was April of this year.  Past medical history,surgical history, family history and social history were all reviewed and documented in the EPIC chart.  Gynecologic History No LMP recorded. Patient is postmenopausal. Contraception: post menopausal status Last Pap: 2015. Results were: normal Last mammogram: 2016. Results were: normal  Obstetric History OB History  Gravida Para Term Preterm AB SAB TAB Ectopic Multiple Living  2 2       1 2     # Outcome Date GA Lbr Len/2nd Weight Sex Delivery Anes PTL Lv  2 Para           1 Para                 ROS: A ROS was performed and pertinent positives and negatives are included in the history.  GENERAL: No fevers or chills. HEENT: No change in vision, no earache, sore throat or sinus congestion. NECK: No pain or stiffness. CARDIOVASCULAR: No chest pain or pressure. No palpitations. PULMONARY: No shortness of breath, cough or wheeze. GASTROINTESTINAL: No abdominal pain, nausea, vomiting or diarrhea, melena or bright red blood per rectum. GENITOURINARY: No urinary frequency, urgency, hesitancy or dysuria. MUSCULOSKELETAL: No joint or muscle pain, no back pain, no recent trauma. DERMATOLOGIC: No rash, no itching, no lesions. ENDOCRINE: No polyuria, polydipsia, no heat or cold intolerance. No recent change in weight. HEMATOLOGICAL: No anemia or easy bruising or bleeding. NEUROLOGIC: No headache, seizures, numbness, tingling or weakness. PSYCHIATRIC: No depression, no loss of interest in normal activity or change in sleep pattern.     Exam: chaperone present  BP 140/76 mmHg  Ht 5\' 5"  (1.651 m)  Wt 126 lb (57.153 kg)  BMI 20.97 kg/m2  Body mass index is 20.97 kg/(m^2).  General appearance : Well developed well nourished female. No acute distress HEENT: Eyes: no retinal hemorrhage or exudates,  Neck supple, trachea midline, no carotid bruits, no thyroidmegaly Lungs: Clear to auscultation, no rhonchi or wheezes, or rib retractions  Heart: Regular rate and rhythm, no murmurs or gallops Breast:Examined in sitting and supine position were symmetrical in appearance, no  palpable masses or tenderness,  no skin retraction, no nipple inversion, no nipple discharge, no skin discoloration, no axillary or supraclavicular lymphadenopathy Abdomen: no palpable masses or tenderness, no rebound or guarding Extremities: no edema or skin discoloration or tenderness  Pelvic:  External genital skin tag slightly hemorrhagic and one of them from rubbing  Bartholin, Urethra, Skene Glands: Within normal limits              Vagina: No gross lesions or discharge, atrophic changes  Cervix: No gross lesions or discharge  Uterus  axial, normal size, shape and consistency, non-tender and mobile  Adnexa  Without masses or tenderness  Anus and perineum  normal   Rectovaginal  normal sphincter tone without palpated masses or tenderness             Hemoccult PCP provides     Assessment/Plan:  69 y.o. female for annual exam with history of osteopenia and increased risk of fracture for which Prolia was initiated last year. PCP doing her blood work. Patient to return to the office in 2 weeks to excise the skin tag that's causing irritation and bleeding. She will put corticosteroid cream that she can purchase over-the-counter 2-3 times a day until I see her in 2 weeks. Pap smear no longer indicated according to the new guidelines. She was reminded on the importance of calcium and vitamin D and regular exercise for osteoporosis prevention. Also importance of monthly breast exam.   Terrance Mass MD, 10:16 AM 11/04/2014

## 2014-11-12 DIAGNOSIS — I10 Essential (primary) hypertension: Secondary | ICD-10-CM | POA: Diagnosis not present

## 2014-11-12 DIAGNOSIS — N184 Chronic kidney disease, stage 4 (severe): Secondary | ICD-10-CM | POA: Diagnosis not present

## 2014-11-12 DIAGNOSIS — E1021 Type 1 diabetes mellitus with diabetic nephropathy: Secondary | ICD-10-CM | POA: Diagnosis not present

## 2014-11-12 DIAGNOSIS — Z681 Body mass index (BMI) 19 or less, adult: Secondary | ICD-10-CM | POA: Diagnosis not present

## 2014-11-19 ENCOUNTER — Ambulatory Visit: Payer: Medicare Other | Admitting: Gynecology

## 2014-12-08 DIAGNOSIS — N189 Chronic kidney disease, unspecified: Secondary | ICD-10-CM | POA: Diagnosis not present

## 2014-12-08 DIAGNOSIS — Z79899 Other long term (current) drug therapy: Secondary | ICD-10-CM | POA: Diagnosis not present

## 2014-12-08 DIAGNOSIS — Z940 Kidney transplant status: Secondary | ICD-10-CM | POA: Diagnosis not present

## 2015-01-15 DIAGNOSIS — E119 Type 2 diabetes mellitus without complications: Secondary | ICD-10-CM | POA: Diagnosis not present

## 2015-01-15 DIAGNOSIS — N189 Chronic kidney disease, unspecified: Secondary | ICD-10-CM | POA: Diagnosis not present

## 2015-01-15 DIAGNOSIS — Z940 Kidney transplant status: Secondary | ICD-10-CM | POA: Diagnosis not present

## 2015-01-15 DIAGNOSIS — D631 Anemia in chronic kidney disease: Secondary | ICD-10-CM | POA: Diagnosis not present

## 2015-02-17 DIAGNOSIS — E785 Hyperlipidemia, unspecified: Secondary | ICD-10-CM | POA: Diagnosis not present

## 2015-02-17 DIAGNOSIS — E1021 Type 1 diabetes mellitus with diabetic nephropathy: Secondary | ICD-10-CM | POA: Diagnosis not present

## 2015-02-17 DIAGNOSIS — I6523 Occlusion and stenosis of bilateral carotid arteries: Secondary | ICD-10-CM | POA: Diagnosis not present

## 2015-02-17 DIAGNOSIS — Z1389 Encounter for screening for other disorder: Secondary | ICD-10-CM | POA: Diagnosis not present

## 2015-02-17 DIAGNOSIS — I10 Essential (primary) hypertension: Secondary | ICD-10-CM | POA: Diagnosis not present

## 2015-02-17 DIAGNOSIS — M859 Disorder of bone density and structure, unspecified: Secondary | ICD-10-CM | POA: Diagnosis not present

## 2015-02-17 DIAGNOSIS — D696 Thrombocytopenia, unspecified: Secondary | ICD-10-CM | POA: Diagnosis not present

## 2015-02-17 DIAGNOSIS — D649 Anemia, unspecified: Secondary | ICD-10-CM | POA: Diagnosis not present

## 2015-02-17 DIAGNOSIS — Z940 Kidney transplant status: Secondary | ICD-10-CM | POA: Diagnosis not present

## 2015-02-17 DIAGNOSIS — Z681 Body mass index (BMI) 19 or less, adult: Secondary | ICD-10-CM | POA: Diagnosis not present

## 2015-02-17 DIAGNOSIS — E11359 Type 2 diabetes mellitus with proliferative diabetic retinopathy without macular edema: Secondary | ICD-10-CM | POA: Diagnosis not present

## 2015-02-19 ENCOUNTER — Telehealth: Payer: Self-pay | Admitting: Gynecology

## 2015-02-19 NOTE — Telephone Encounter (Signed)
VM from pt regarding Prolia. Due after 03/26/15 , Lab work at PCP. Explained I will call her in September to schedule after checking insurance benefits.

## 2015-02-21 IMAGING — US ABDOMEN ULTRASOUND LIMITED
1 series · 14 of 25 positions shown · non-contrast
Comparison: CT abdomen and pelvis 12/10/2013

CLINICAL DATA: Abdominal pain and vomiting.

EXAM:
US ABDOMEN LIMITED - RIGHT UPPER QUADRANT

[Series 1: abdomen ultrasound limited · 0.21mm/px · 14 of 46 slices shown]
[im 1/46]
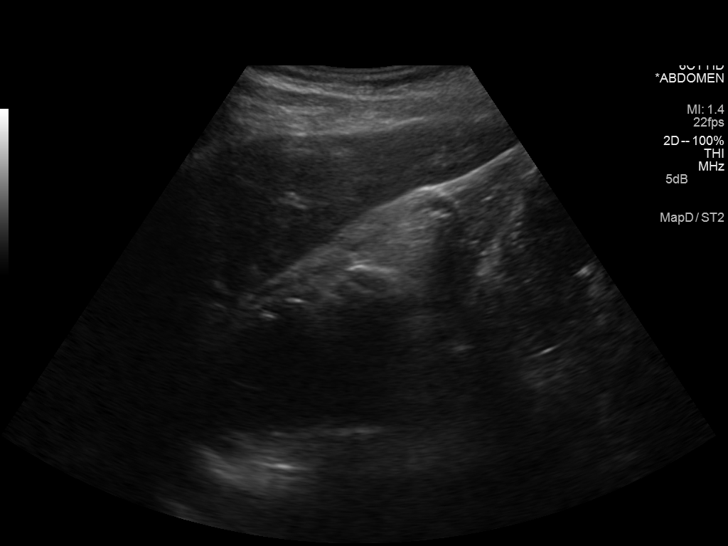
[im 4/46]
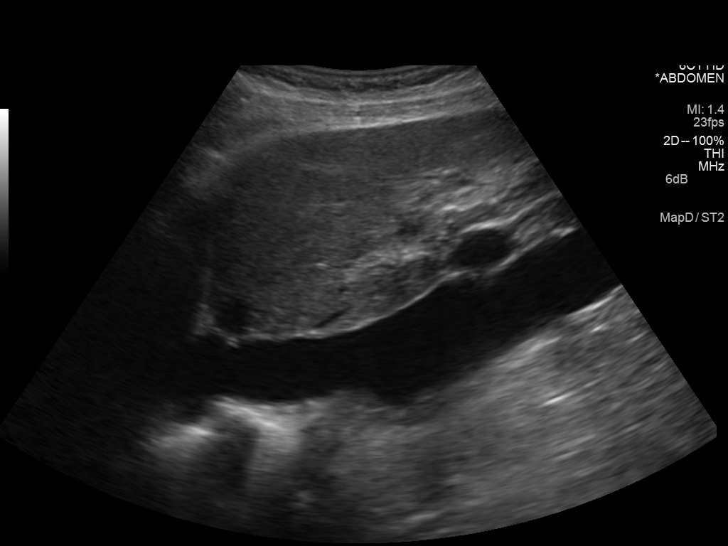
[im 8/46]
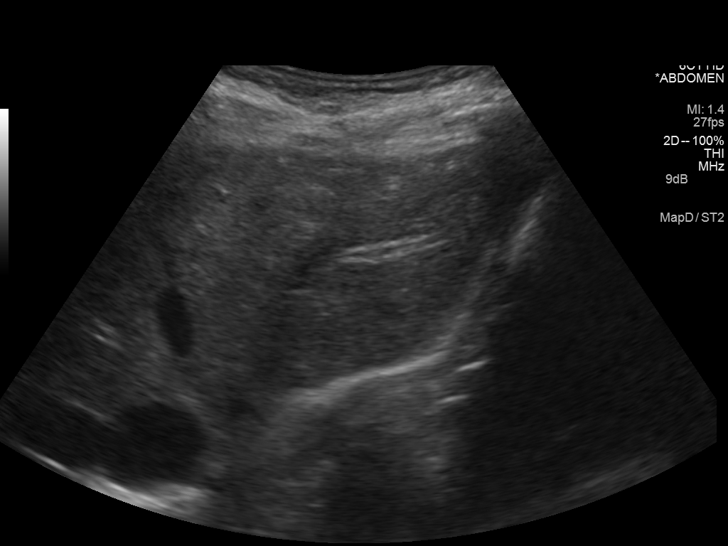
[im 12/46]
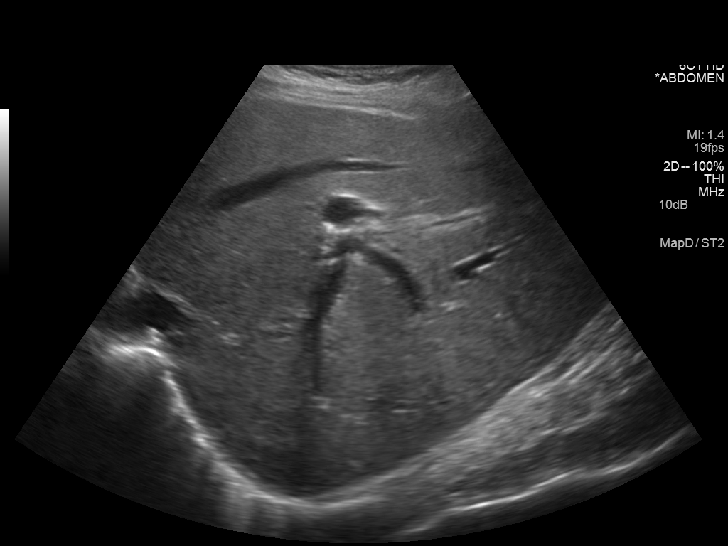
[im 16/46]
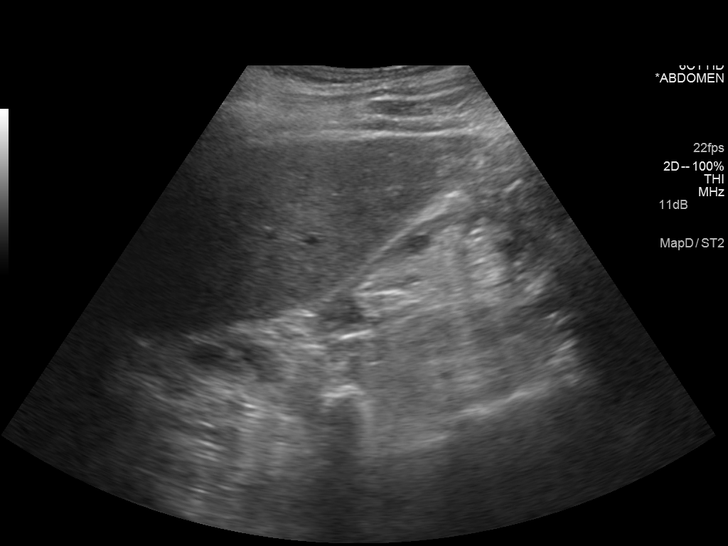
[im 17/46]
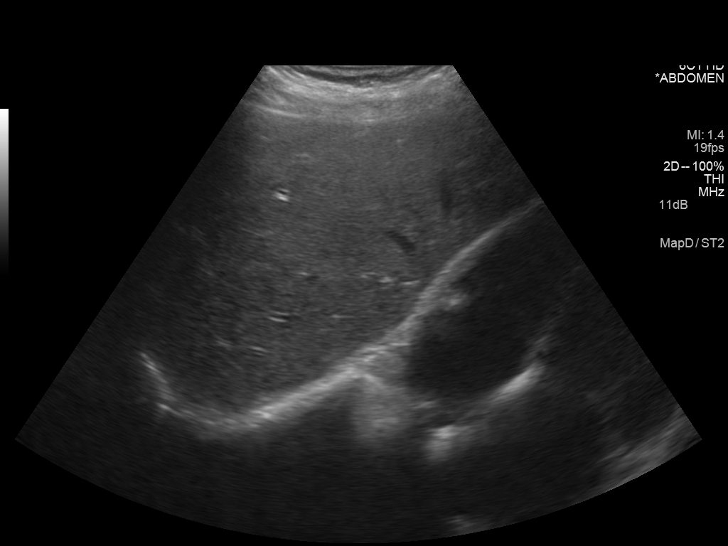
[im 21/46]
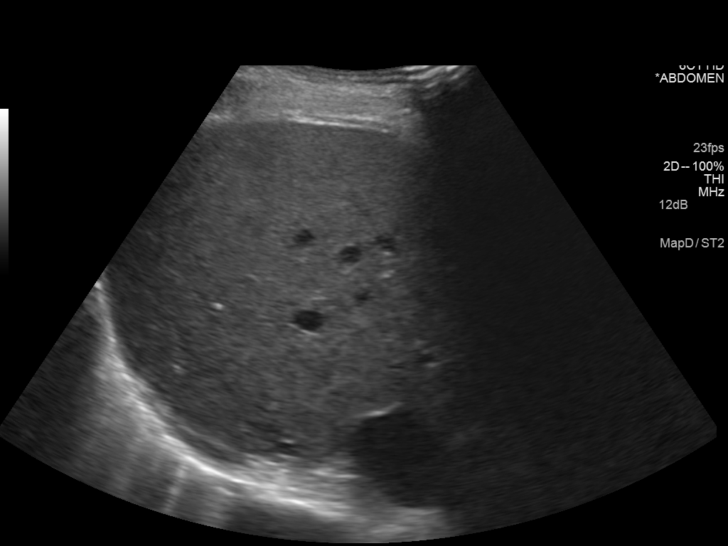
[im 25/46]
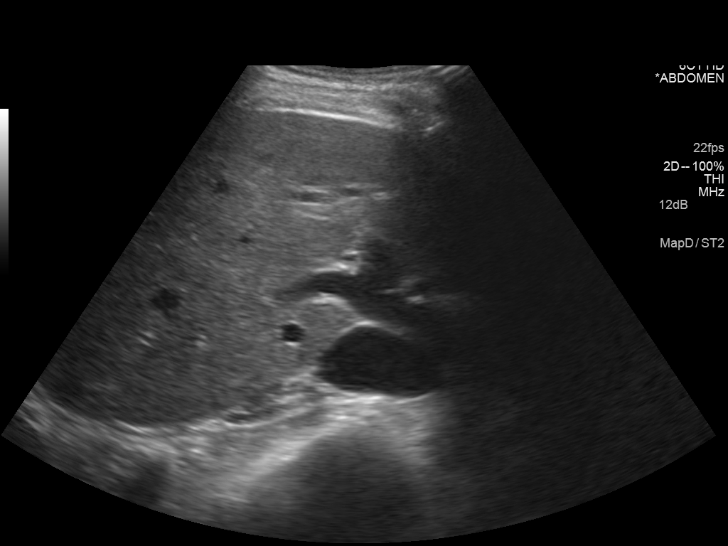
[im 29/46]
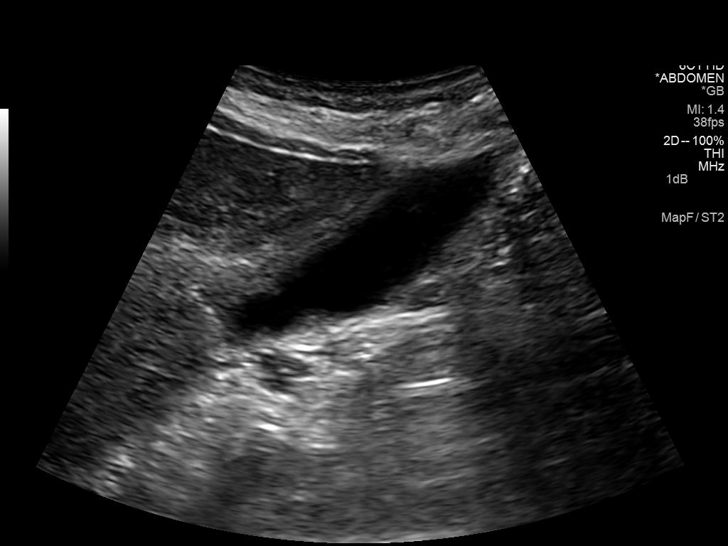
[im 31/46]
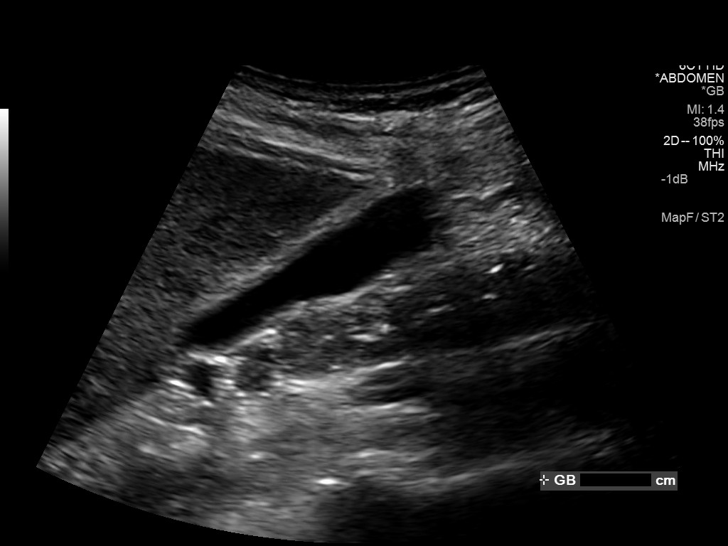
[im 34/46]
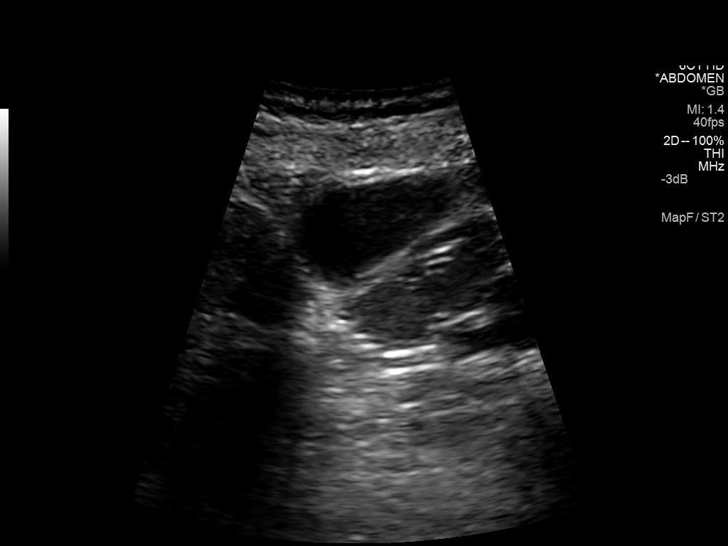
[im 38/46]
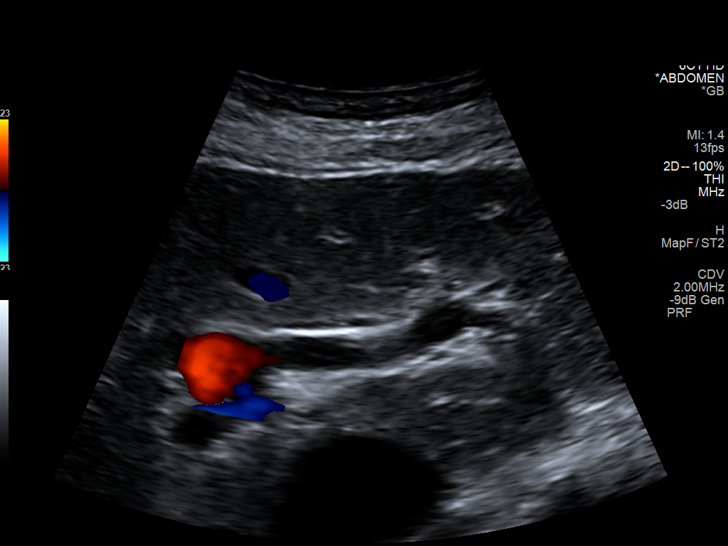
[im 42/46]
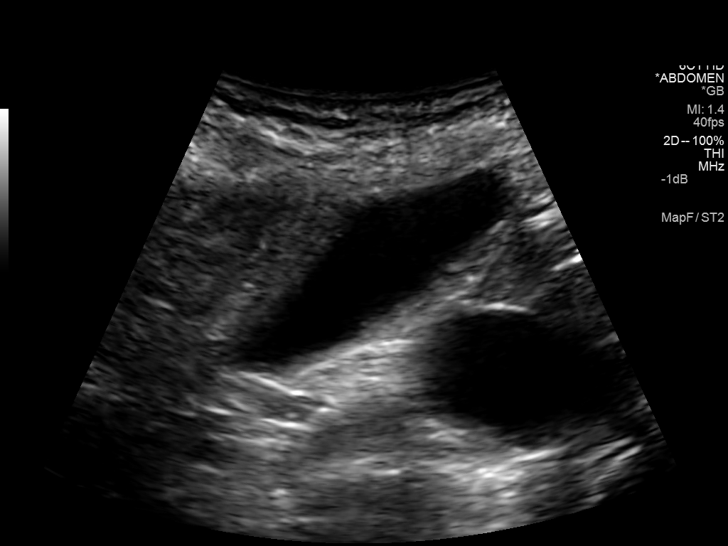
[im 46/46]
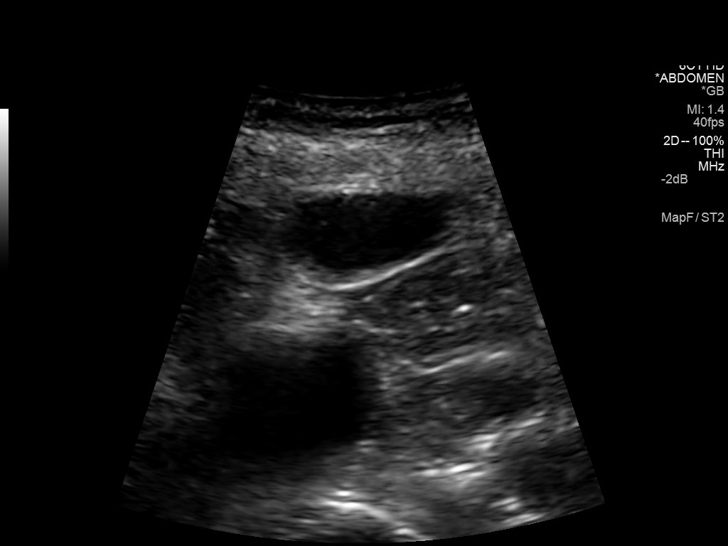

[14 of 25 positions shown; findings below may reference images not displayed]

FINDINGS: Gallbladder:

Mild gallbladder wall thickening. This could be due to inflammation
or contraction. No stones or sludge visualized. Murphy's sign is
negative.

Common bile duct:

Diameter: 2.2 mm, normal

Liver:

No focal lesion identified. Within normal limits in parenchymal
echogenicity.
IMPRESSION: Mild nonspecific gallbladder wall thickening. No stones or sludge
visualized. Murphy's sign negative.

## 2015-03-09 DIAGNOSIS — Z940 Kidney transplant status: Secondary | ICD-10-CM | POA: Diagnosis not present

## 2015-03-09 DIAGNOSIS — N189 Chronic kidney disease, unspecified: Secondary | ICD-10-CM | POA: Diagnosis not present

## 2015-03-09 DIAGNOSIS — Z79899 Other long term (current) drug therapy: Secondary | ICD-10-CM | POA: Diagnosis not present

## 2015-03-09 NOTE — Telephone Encounter (Signed)
Received benefits from insurance company for [employer]. No Deductible after $166 (met), $0 cost to pt.  Injection will be due after 03/26/2015.. Calcium 9.0 on 08/21/2014.  Appointment made for 03/29/15 at [facility].

## 2015-03-17 ENCOUNTER — Encounter: Payer: Self-pay | Admitting: Gynecology

## 2015-03-29 ENCOUNTER — Ambulatory Visit (INDEPENDENT_AMBULATORY_CARE_PROVIDER_SITE_OTHER): Payer: Medicare Other | Admitting: Anesthesiology

## 2015-03-29 DIAGNOSIS — M810 Age-related osteoporosis without current pathological fracture: Secondary | ICD-10-CM

## 2015-03-29 MED ORDER — DENOSUMAB 60 MG/ML ~~LOC~~ SOLN
60.0000 mg | Freq: Once | SUBCUTANEOUS | Status: AC
  Administered 2015-03-29: 60 mg via SUBCUTANEOUS

## 2015-03-30 NOTE — Telephone Encounter (Signed)
Received her Prolia 03/29/15 , next injection after 09/27/15 . She will also need Calcium level in 2017

## 2015-03-31 DIAGNOSIS — Z940 Kidney transplant status: Secondary | ICD-10-CM | POA: Diagnosis not present

## 2015-03-31 DIAGNOSIS — E1021 Type 1 diabetes mellitus with diabetic nephropathy: Secondary | ICD-10-CM | POA: Diagnosis not present

## 2015-03-31 DIAGNOSIS — Z23 Encounter for immunization: Secondary | ICD-10-CM | POA: Diagnosis not present

## 2015-03-31 DIAGNOSIS — I10 Essential (primary) hypertension: Secondary | ICD-10-CM | POA: Diagnosis not present

## 2015-05-04 DIAGNOSIS — H3322 Serous retinal detachment, left eye: Secondary | ICD-10-CM | POA: Diagnosis not present

## 2015-05-04 DIAGNOSIS — Z961 Presence of intraocular lens: Secondary | ICD-10-CM | POA: Diagnosis not present

## 2015-05-04 DIAGNOSIS — E113519 Type 2 diabetes mellitus with proliferative diabetic retinopathy with macular edema, unspecified eye: Secondary | ICD-10-CM | POA: Diagnosis not present

## 2015-05-31 ENCOUNTER — Other Ambulatory Visit (HOSPITAL_BASED_OUTPATIENT_CLINIC_OR_DEPARTMENT_OTHER): Payer: Medicare Other

## 2015-05-31 ENCOUNTER — Ambulatory Visit (HOSPITAL_BASED_OUTPATIENT_CLINIC_OR_DEPARTMENT_OTHER): Payer: Medicare Other | Admitting: Hematology & Oncology

## 2015-05-31 ENCOUNTER — Encounter: Payer: Self-pay | Admitting: Hematology & Oncology

## 2015-05-31 VITALS — BP 155/60 | HR 52 | Temp 98.1°F | Resp 18 | Ht 65.0 in | Wt 122.0 lb

## 2015-05-31 DIAGNOSIS — D696 Thrombocytopenia, unspecified: Secondary | ICD-10-CM

## 2015-05-31 DIAGNOSIS — E10319 Type 1 diabetes mellitus with unspecified diabetic retinopathy without macular edema: Secondary | ICD-10-CM

## 2015-05-31 DIAGNOSIS — N184 Chronic kidney disease, stage 4 (severe): Secondary | ICD-10-CM

## 2015-05-31 DIAGNOSIS — Z961 Presence of intraocular lens: Secondary | ICD-10-CM | POA: Insufficient documentation

## 2015-05-31 HISTORY — DX: Thrombocytopenia, unspecified: D69.6

## 2015-05-31 LAB — CBC WITH DIFFERENTIAL (CANCER CENTER ONLY)
BASO#: 0 10*3/uL (ref 0.0–0.2)
BASO%: 0.5 % (ref 0.0–2.0)
EOS%: 9 % — AB (ref 0.0–7.0)
Eosinophils Absolute: 0.4 10*3/uL (ref 0.0–0.5)
HEMATOCRIT: 38.3 % (ref 34.8–46.6)
HGB: 12.6 g/dL (ref 11.6–15.9)
LYMPH#: 1.1 10*3/uL (ref 0.9–3.3)
LYMPH%: 26.4 % (ref 14.0–48.0)
MCH: 28.9 pg (ref 26.0–34.0)
MCHC: 32.9 g/dL (ref 32.0–36.0)
MCV: 88 fL (ref 81–101)
MONO#: 0.5 10*3/uL (ref 0.1–0.9)
MONO%: 11.6 % (ref 0.0–13.0)
NEUT%: 52.5 % (ref 39.6–80.0)
NEUTROS ABS: 2.2 10*3/uL (ref 1.5–6.5)
Platelets: 111 10*3/uL — ABNORMAL LOW (ref 145–400)
RBC: 4.36 10*6/uL (ref 3.70–5.32)
RDW: 12.8 % (ref 11.1–15.7)
WBC: 4.2 10*3/uL (ref 3.9–10.0)

## 2015-05-31 LAB — CHCC SATELLITE - SMEAR

## 2015-05-31 NOTE — Progress Notes (Signed)
Hematology and Oncology Follow Up Visit  Jasmine Cummings [id number] [birth year] 69 y.o. 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 Things are not going as well right now. Apparently, a son-in-law died unexpectedly last week right before Thanksgiving. He had never been sick. It sounds like he may been septic from a kidney stone.  Her husband is not doing too well. He is not that mobile. As such, they really cannot go anywhere.  Otherwise, she seems to be doing pretty well. She's had no problems with bleeding. Senna problems with bowels or bladder.  She sees the transplant doctors at [facility] yearly.   She's had no fever. She's had no cough. There's been no rashes. She's had no leg swelling.  Overall, her performance status is ECOG 1.  Medications:  Current outpatient prescriptions:  .  amLODipine (NORVASC) 10 MG tablet, Take 10 mg by mouth at bedtime., Disp: , Rfl:  .  ezetimibe-simvastatin (VYTORIN) 10-20 MG per tablet, Take 1 tablet by mouth at bedtime. Take Mon, Wed, Fri at HS, Disp: , Rfl:  .  FLUoxetine (PROZAC) 40 MG capsule, Take 40 mg by mouth every morning. , Disp: , Rfl:  .  insulin aspart (NOVOLOG) 100 UNIT/ML injection, Inject 2-10 Units into the skin 4 (four) times daily. Sliding Scale, Disp: , Rfl:  .  Insulin Glargine (LANTUS [place]), Inject 9 Units into the skin daily. 9 units in AM, Disp: , Rfl:  .  metoprolol tartrate (LOPRESSOR) 25 MG tablet, Take 50 mg by mouth 2 (two) times daily. , Disp: , Rfl:  .  Multiple Vitamin (MULTIVITAMIN) capsule, Take 1 capsule by mouth daily.  , Disp: , Rfl:  .  mycophenolate (MYFORTIC) 180 MG EC tablet, Take 180-360 mg by mouth 2 (two) times daily. Takes 2 in the morning and 1 at night, Disp: , Rfl:  .  NOVOFINE 32G X 6 MM MISC, as needed. ,  Disp: , Rfl:  .  omeprazole (PRILOSEC) 20 MG capsule, Take 20 mg by mouth daily., Disp: , Rfl:  .  sulfamethoxazole-trimethoprim (BACTRIM,SEPTRA) 400-80 MG per tablet, Take 400 tablets by mouth. Take Mon, Wed, Fri one tablet, Disp: , Rfl:  .  tacrolimus (PROGRAF) 1 MG capsule, Take 1-2 mg by mouth 2 (two) times daily. Takes 2 in the morning and 1 at night, Disp: , Rfl:  .  zinc gluconate 50 MG tablet, Take 50 mg by mouth daily., Disp: , Rfl:   Allergies:  Allergies  Allergen Reactions  . Latex Hives    REACTION: swelling    Past Medical History, Surgical history, Social history, and Family History were reviewed and updated.  Review of Systems: As above  Physical Exam:  height is _0  (1.651 m) and weight is 122 lb (55.339 kg). Her oral temperature is 98.1 F (36.7 C). Her blood pressure is 155/60 and her pulse is 52. Her respiration is 18.   Well-developed and well-nourished white female in no obvious distress. Head and neck exam shows no other oral lesions. There are no palpable cervical or supraclavicular lymph nodes. Lungs are clear bilaterally. Cardiac exam regular rate and rhythm with no murmurs rubs or bruits. Abdomen is soft. She has well-healed laparotomy scars. There is no fluid wave. Her kidney transplant is in the right abdomen. There is no  palpable liver or spleen tip. Back exam shows no osteoporosis. There is no kyphosis. Extremities shows no clubbing cyanosis or edema. Skin exam shows a few scattered ecchymoses. No petechia. Neurological exam is nonfocal.  Lab Results  Component Value Date   WBC 4.2 05/31/2015   HGB 12.6 05/31/2015   HCT 38.3 05/31/2015   MCV 88 05/31/2015   PLT 111* 05/31/2015     Chemistry      Component Value Date/Time   NA 138 12/11/2013 0447   NA 131* 09/15/2010 0815   K 4.7 12/11/2013 0447   K 4.1 09/15/2010 0815   CL 106 12/11/2013 0447   CL 97 09/15/2010 0815   CO2 24 12/11/2013 0447   CO2 26 09/15/2010 0815   BUN 29* 12/11/2013 0447    BUN 77* 09/15/2010 0815   CREATININE 1.61* 12/11/2013 0447   CREATININE 3.68* 09/15/2010 0815      Component Value Date/Time   CALCIUM 8.5 12/11/2013 0447   CALCIUM 8.7 09/15/2010 0815   ALKPHOS 60 12/11/2013 0447   AST 47* 12/11/2013 0447   ALT 31 12/11/2013 0447   BILITOT 0.4 12/11/2013 0447         Impression and Plan: JasmineCopelyn Cummings is a 69 year old white female with thrombocytopenia. We did the ultimate test, that being a bone marrow biopsy. This did not show any malignancy or myelodysplastic changes.  Again I believe that the thrombocytopenia is related to her medications for her kidney transplant.  I think we can probably get her back in 8 months for followup.   I don't see that we need any blood work in between visits.   Volanda Napoleon, MD 11/28/201610:22 AM

## 2015-06-08 DIAGNOSIS — Z940 Kidney transplant status: Secondary | ICD-10-CM | POA: Diagnosis not present

## 2015-06-08 DIAGNOSIS — Z6820 Body mass index (BMI) 20.0-20.9, adult: Secondary | ICD-10-CM | POA: Diagnosis not present

## 2015-06-08 DIAGNOSIS — E113591 Type 2 diabetes mellitus with proliferative diabetic retinopathy without macular edema, right eye: Secondary | ICD-10-CM | POA: Diagnosis not present

## 2015-06-08 DIAGNOSIS — I10 Essential (primary) hypertension: Secondary | ICD-10-CM | POA: Diagnosis not present

## 2015-06-08 DIAGNOSIS — E1142 Type 2 diabetes mellitus with diabetic polyneuropathy: Secondary | ICD-10-CM | POA: Diagnosis not present

## 2015-06-08 DIAGNOSIS — D696 Thrombocytopenia, unspecified: Secondary | ICD-10-CM | POA: Diagnosis not present

## 2015-06-08 DIAGNOSIS — D6489 Other specified anemias: Secondary | ICD-10-CM | POA: Diagnosis not present

## 2015-06-08 DIAGNOSIS — E784 Other hyperlipidemia: Secondary | ICD-10-CM | POA: Diagnosis not present

## 2015-06-11 DIAGNOSIS — Z940 Kidney transplant status: Secondary | ICD-10-CM | POA: Diagnosis not present

## 2015-06-11 DIAGNOSIS — Z79899 Other long term (current) drug therapy: Secondary | ICD-10-CM | POA: Diagnosis not present

## 2015-06-11 DIAGNOSIS — N189 Chronic kidney disease, unspecified: Secondary | ICD-10-CM | POA: Diagnosis not present

## 2015-07-16 DIAGNOSIS — E119 Type 2 diabetes mellitus without complications: Secondary | ICD-10-CM | POA: Diagnosis not present

## 2015-07-16 DIAGNOSIS — N189 Chronic kidney disease, unspecified: Secondary | ICD-10-CM | POA: Diagnosis not present

## 2015-07-16 DIAGNOSIS — Z940 Kidney transplant status: Secondary | ICD-10-CM | POA: Diagnosis not present

## 2015-07-16 DIAGNOSIS — E785 Hyperlipidemia, unspecified: Secondary | ICD-10-CM | POA: Diagnosis not present

## 2015-07-16 DIAGNOSIS — D631 Anemia in chronic kidney disease: Secondary | ICD-10-CM | POA: Diagnosis not present

## 2015-07-20 DIAGNOSIS — E1021 Type 1 diabetes mellitus with diabetic nephropathy: Secondary | ICD-10-CM | POA: Diagnosis not present

## 2015-07-20 DIAGNOSIS — I10 Essential (primary) hypertension: Secondary | ICD-10-CM | POA: Diagnosis not present

## 2015-07-20 DIAGNOSIS — K219 Gastro-esophageal reflux disease without esophagitis: Secondary | ICD-10-CM | POA: Diagnosis not present

## 2015-07-20 DIAGNOSIS — N184 Chronic kidney disease, stage 4 (severe): Secondary | ICD-10-CM | POA: Diagnosis not present

## 2015-07-20 DIAGNOSIS — Z6820 Body mass index (BMI) 20.0-20.9, adult: Secondary | ICD-10-CM | POA: Diagnosis not present

## 2015-08-05 ENCOUNTER — Other Ambulatory Visit: Payer: Self-pay

## 2015-08-05 DIAGNOSIS — Z1231 Encounter for screening mammogram for malignant neoplasm of breast: Secondary | ICD-10-CM

## 2015-08-19 ENCOUNTER — Telehealth: Payer: Self-pay | Admitting: Gynecology

## 2015-08-19 NOTE — Telephone Encounter (Signed)
prolia injection  Due after 09/27/15  Dr Forde Dandy PCP , no insurance change

## 2015-08-25 NOTE — Telephone Encounter (Signed)
Insurance benefits Prolia No deductible, no co pay , no co insurance , Cost to pt $0. Secondary coverage.

## 2015-08-26 ENCOUNTER — Encounter: Payer: Self-pay | Admitting: Gynecology

## 2015-08-31 NOTE — Telephone Encounter (Signed)
Calcium 9.1 on 06/11/15  PCP. Prolia scheduled for 09/28/15 at 10 am with Nurse only

## 2015-09-02 ENCOUNTER — Ambulatory Visit
Admission: RE | Admit: 2015-09-02 | Discharge: 2015-09-02 | Disposition: A | Discharge status: Home / Self Care | Payer: Medicare Other | Source: Ambulatory Visit

## 2015-09-02 DIAGNOSIS — N189 Chronic kidney disease, unspecified: Secondary | ICD-10-CM | POA: Diagnosis not present

## 2015-09-02 DIAGNOSIS — Z1231 Encounter for screening mammogram for malignant neoplasm of breast: Secondary | ICD-10-CM

## 2015-09-02 DIAGNOSIS — Z940 Kidney transplant status: Secondary | ICD-10-CM | POA: Diagnosis not present

## 2015-09-02 DIAGNOSIS — Z79899 Other long term (current) drug therapy: Secondary | ICD-10-CM | POA: Diagnosis not present

## 2015-09-28 ENCOUNTER — Ambulatory Visit (INDEPENDENT_AMBULATORY_CARE_PROVIDER_SITE_OTHER): Payer: Medicare Other | Admitting: Gynecology

## 2015-09-28 DIAGNOSIS — M810 Age-related osteoporosis without current pathological fracture: Secondary | ICD-10-CM | POA: Diagnosis not present

## 2015-09-28 MED ORDER — DENOSUMAB 60 MG/ML ~~LOC~~ SOLN
60.0000 mg | Freq: Once | SUBCUTANEOUS | Status: AC
  Administered 2015-09-28: 60 mg via SUBCUTANEOUS

## 2015-10-06 DIAGNOSIS — F428 Other obsessive-compulsive disorder: Secondary | ICD-10-CM | POA: Diagnosis not present

## 2015-10-06 DIAGNOSIS — Z940 Kidney transplant status: Secondary | ICD-10-CM | POA: Diagnosis not present

## 2015-10-06 DIAGNOSIS — M859 Disorder of bone density and structure, unspecified: Secondary | ICD-10-CM | POA: Diagnosis not present

## 2015-10-06 DIAGNOSIS — E113599 Type 2 diabetes mellitus with proliferative diabetic retinopathy without macular edema, unspecified eye: Secondary | ICD-10-CM | POA: Diagnosis not present

## 2015-10-06 DIAGNOSIS — F329 Major depressive disorder, single episode, unspecified: Secondary | ICD-10-CM | POA: Diagnosis not present

## 2015-10-06 DIAGNOSIS — Z1389 Encounter for screening for other disorder: Secondary | ICD-10-CM | POA: Diagnosis not present

## 2015-10-06 DIAGNOSIS — D6489 Other specified anemias: Secondary | ICD-10-CM | POA: Diagnosis not present

## 2015-10-06 DIAGNOSIS — E784 Other hyperlipidemia: Secondary | ICD-10-CM | POA: Diagnosis not present

## 2015-10-06 DIAGNOSIS — I10 Essential (primary) hypertension: Secondary | ICD-10-CM | POA: Diagnosis not present

## 2015-10-06 DIAGNOSIS — I6523 Occlusion and stenosis of bilateral carotid arteries: Secondary | ICD-10-CM | POA: Diagnosis not present

## 2015-10-06 DIAGNOSIS — E1142 Type 2 diabetes mellitus with diabetic polyneuropathy: Secondary | ICD-10-CM | POA: Diagnosis not present

## 2015-10-06 DIAGNOSIS — D6949 Other primary thrombocytopenia: Secondary | ICD-10-CM | POA: Diagnosis not present

## 2015-10-07 NOTE — Telephone Encounter (Signed)
Injection given 09/28/15 next injection given after 03/31/16

## 2015-10-11 DIAGNOSIS — Z79899 Other long term (current) drug therapy: Secondary | ICD-10-CM | POA: Diagnosis not present

## 2015-10-11 DIAGNOSIS — D899 Disorder involving the immune mechanism, unspecified: Secondary | ICD-10-CM | POA: Diagnosis not present

## 2015-10-11 DIAGNOSIS — E119 Type 2 diabetes mellitus without complications: Secondary | ICD-10-CM | POA: Diagnosis not present

## 2015-10-11 DIAGNOSIS — E785 Hyperlipidemia, unspecified: Secondary | ICD-10-CM | POA: Diagnosis not present

## 2015-10-11 DIAGNOSIS — D8989 Other specified disorders involving the immune mechanism, not elsewhere classified: Secondary | ICD-10-CM | POA: Diagnosis not present

## 2015-10-11 DIAGNOSIS — I10 Essential (primary) hypertension: Secondary | ICD-10-CM | POA: Diagnosis not present

## 2015-10-11 DIAGNOSIS — E109 Type 1 diabetes mellitus without complications: Secondary | ICD-10-CM | POA: Diagnosis not present

## 2015-10-11 DIAGNOSIS — Z940 Kidney transplant status: Secondary | ICD-10-CM | POA: Diagnosis not present

## 2015-10-11 DIAGNOSIS — Z794 Long term (current) use of insulin: Secondary | ICD-10-CM | POA: Diagnosis not present

## 2015-10-11 DIAGNOSIS — Z4822 Encounter for aftercare following kidney transplant: Secondary | ICD-10-CM | POA: Diagnosis not present

## 2015-11-24 ENCOUNTER — Encounter: Payer: Medicare Other | Admitting: Gynecology

## 2015-12-21 DIAGNOSIS — I10 Essential (primary) hypertension: Secondary | ICD-10-CM | POA: Diagnosis not present

## 2015-12-21 DIAGNOSIS — Z681 Body mass index (BMI) 19 or less, adult: Secondary | ICD-10-CM | POA: Diagnosis not present

## 2015-12-21 DIAGNOSIS — N184 Chronic kidney disease, stage 4 (severe): Secondary | ICD-10-CM | POA: Diagnosis not present

## 2015-12-21 DIAGNOSIS — E1021 Type 1 diabetes mellitus with diabetic nephropathy: Secondary | ICD-10-CM | POA: Diagnosis not present

## 2016-01-03 DIAGNOSIS — N189 Chronic kidney disease, unspecified: Secondary | ICD-10-CM | POA: Diagnosis not present

## 2016-01-03 DIAGNOSIS — Z940 Kidney transplant status: Secondary | ICD-10-CM | POA: Diagnosis not present

## 2016-01-03 DIAGNOSIS — Z79899 Other long term (current) drug therapy: Secondary | ICD-10-CM | POA: Diagnosis not present

## 2016-01-06 ENCOUNTER — Encounter: Payer: Self-pay | Admitting: Gynecology

## 2016-01-06 ENCOUNTER — Ambulatory Visit (INDEPENDENT_AMBULATORY_CARE_PROVIDER_SITE_OTHER): Payer: Medicare Other | Admitting: Gynecology

## 2016-01-06 VITALS — BP 130/88 | Ht 66.0 in | Wt 119.0 lb

## 2016-01-06 DIAGNOSIS — M8580 Other specified disorders of bone density and structure, unspecified site: Secondary | ICD-10-CM | POA: Diagnosis not present

## 2016-01-06 DIAGNOSIS — R2689 Other abnormalities of gait and mobility: Secondary | ICD-10-CM

## 2016-01-06 DIAGNOSIS — Z01419 Encounter for gynecological examination (general) (routine) without abnormal findings: Secondary | ICD-10-CM

## 2016-01-06 DIAGNOSIS — R29818 Other symptoms and signs involving the nervous system: Secondary | ICD-10-CM

## 2016-01-06 DIAGNOSIS — F329 Major depressive disorder, single episode, unspecified: Secondary | ICD-10-CM | POA: Diagnosis not present

## 2016-01-06 DIAGNOSIS — F32A Depression, unspecified: Secondary | ICD-10-CM

## 2016-01-06 NOTE — Patient Instructions (Signed)

## 2016-01-06 NOTE — Progress Notes (Signed)
Jasmine Cummings 1945/07/07 [id number]   History:    70 y.o.  for annual gyn exam today with several issues. One being that for the past several weeks she has noticed that when she standing or turning quickly she lose her balance to her left side. She denies any visual disturbances or hearing difficulties or any loss of taste. She does have diabetic neuropathy on one of her eyes as well as on her feet. She also recently has been somewhat depressed with family issues husbands health and other family members for which she is currently on Prozac. She has no suicidal ideation and appears to have good family support.  Review of her record indicated she had a kidney transplant in 2013 and is currently followed by Dr. Justin Mend her nephrologist. She also suffers from type 1 diabetes where she is under the care of Dr. Roque Cash endocrinologist.Her ophthalmologist is Dr. Ebony Cargo which cc several times a year.Patient stated her last colonoscopy was in 2011 which was normal. She had her mammogram this year. Patient denies any prior history of abnormal Pap smear. Last Pap smear 2012. The patient's several years ago had been on hormone replacement therapy and currently not on any HRT. The patient has complained of on and off vasomotor symptoms and vaginal dryness. Patient states that her shingles and Tdap vaccine are up-to-date  Patient had a bone density study in 2015 here in our office which demonstrated that the lowest T score was -1.3 at the left femoral neck. Her Frax analysis indicated that she was above threshold asked to risk of hip fracture of 3.2% over the course of the next 10 years. For this reason she was started on Prolia. She reports her last injection was March of this year.  Past medical history,surgical history, family history and social history were all reviewed and documented in the EPIC chart.  Gynecologic History No LMP recorded. Patient is postmenopausal. Contraception: post menopausal  status Last Pap: 2015. Results were: normal Last mammogram: 2017. Results were: normal  Obstetric History OB History  Gravida Para Term Preterm AB SAB TAB Ectopic Multiple Living  2 2       1 2     # Outcome Date GA Lbr Len/2nd Weight Sex Delivery Anes PTL Lv  2 Para           1 Para                ROS: A ROS was performed and pertinent positives and negatives are included in the history.  GENERAL: No fevers or chills. HEENT: No change in vision, no earache, sore throat or sinus congestion. NECK: No pain or stiffness. CARDIOVASCULAR: No chest pain or pressure. No palpitations. PULMONARY: No shortness of breath, cough or wheeze. GASTROINTESTINAL: No abdominal pain, nausea, vomiting or diarrhea, melena or bright red blood per rectum. GENITOURINARY: No urinary frequency, urgency, hesitancy or dysuria. MUSCULOSKELETAL: No joint or muscle pain, no back pain, no recent trauma. DERMATOLOGIC: No rash, no itching, no lesions. ENDOCRINE: No polyuria, polydipsia, no heat or cold intolerance. No recent change in weight. HEMATOLOGICAL: No anemia or easy bruising or bleeding. NEUROLOGIC: Loss of balance. PSYCHIATRIC: No depression, no loss of interest in normal activity or change in sleep pattern.     Exam: chaperone present  BP 130/88 mmHg  Ht 5\' 6"  (1.676 m)  Wt 119 lb (53.978 kg)  BMI 19.22 kg/m2  Body mass index is 19.22 kg/(m^2).  General appearance : Well developed well nourished female.  No acute distress Neurological exam: Cranial nerves II through XII intact. HEENT: Eyes: no retinal hemorrhage or exudates,  Neck supple, trachea midline, no carotid bruits, no thyroidmegaly Lungs: Clear to auscultation, no rhonchi or wheezes, or rib retractions  Heart: Regular rate and rhythm, no murmurs or gallops Breast:Examined in sitting and supine position were symmetrical in appearance, no palpable masses or tenderness,  no skin retraction, no nipple inversion, no nipple discharge, no skin  discoloration, no axillary or supraclavicular lymphadenopathy Abdomen: no palpable masses or tenderness, no rebound or guarding Extremities: no edema or skin discoloration or tenderness  Pelvic:  Bartholin, Urethra, Skene Glands: Within normal limits             Vagina: No gross lesions or discharge, atrophic changes  Cervix: No gross lesions or discharge  Uterus  anteverted, normal size, shape and consistency, non-tender and mobile  Adnexa  Without masses or tenderness  Anus and perineum  normal   Rectovaginal  normal sphincter tone without palpated masses or tenderness             Hemoccult PCP provides     Assessment/Plan:  70 y.o. female for annual exam with osteopenia high-risk for fracture based on Frax analysis currently on second year of Prolia and tolerating medication well. Her next dose is due in September this year. Her PCP has been doing her blood work to include her calcium and vitamin D levels. She is taking calcium vitamin D and maintains an active lifestyle. Pap smear not indicated this year. Her cranial nerves II through XII were intact. But I'm going to refer her to the neurologist for further evaluation as a result of her imbalance issues. She is currently on Prozac does not need any additional medication for depression and anxiety.   Terrance Mass MD, 11:20 AM 01/06/2016

## 2016-01-07 DIAGNOSIS — E1021 Type 1 diabetes mellitus with diabetic nephropathy: Secondary | ICD-10-CM | POA: Diagnosis not present

## 2016-01-11 ENCOUNTER — Telehealth: Payer: Self-pay | Admitting: *Deleted

## 2016-01-11 NOTE — Telephone Encounter (Signed)
Appointment on 04/12/16 @ 9:15am with Dr.Lewitt.

## 2016-01-11 NOTE — Telephone Encounter (Signed)
-----   Message from Terrance Mass, MD sent at 01/06/2016 11:02 AM EDT ----- Please make an appointment with Dr. Melton Alar for this patient with balance issues

## 2016-01-11 NOTE — Telephone Encounter (Signed)
Notes faxed to [place] office they will contact to schedule and call me with time and date.

## 2016-01-19 DIAGNOSIS — E119 Type 2 diabetes mellitus without complications: Secondary | ICD-10-CM | POA: Diagnosis not present

## 2016-01-19 DIAGNOSIS — E785 Hyperlipidemia, unspecified: Secondary | ICD-10-CM | POA: Diagnosis not present

## 2016-01-19 DIAGNOSIS — D631 Anemia in chronic kidney disease: Secondary | ICD-10-CM | POA: Diagnosis not present

## 2016-01-19 DIAGNOSIS — Z940 Kidney transplant status: Secondary | ICD-10-CM | POA: Diagnosis not present

## 2016-01-19 DIAGNOSIS — N185 Chronic kidney disease, stage 5: Secondary | ICD-10-CM | POA: Diagnosis not present

## 2016-01-24 ENCOUNTER — Other Ambulatory Visit: Payer: Medicare Other

## 2016-01-24 ENCOUNTER — Ambulatory Visit: Payer: Medicare Other | Admitting: Hematology & Oncology

## 2016-02-01 DIAGNOSIS — Z681 Body mass index (BMI) 19 or less, adult: Secondary | ICD-10-CM | POA: Diagnosis not present

## 2016-02-01 DIAGNOSIS — Z940 Kidney transplant status: Secondary | ICD-10-CM | POA: Diagnosis not present

## 2016-02-01 DIAGNOSIS — I10 Essential (primary) hypertension: Secondary | ICD-10-CM | POA: Diagnosis not present

## 2016-02-01 DIAGNOSIS — E1021 Type 1 diabetes mellitus with diabetic nephropathy: Secondary | ICD-10-CM | POA: Diagnosis not present

## 2016-02-02 ENCOUNTER — Ambulatory Visit (HOSPITAL_BASED_OUTPATIENT_CLINIC_OR_DEPARTMENT_OTHER): Payer: Medicare Other | Admitting: Hematology & Oncology

## 2016-02-02 ENCOUNTER — Other Ambulatory Visit (HOSPITAL_BASED_OUTPATIENT_CLINIC_OR_DEPARTMENT_OTHER): Payer: Medicare Other

## 2016-02-02 VITALS — BP 138/63 | HR 57 | Temp 98.2°F | Wt 121.0 lb

## 2016-02-02 DIAGNOSIS — D696 Thrombocytopenia, unspecified: Secondary | ICD-10-CM

## 2016-02-02 DIAGNOSIS — E10319 Type 1 diabetes mellitus with unspecified diabetic retinopathy without macular edema: Secondary | ICD-10-CM

## 2016-02-02 LAB — CBC WITH DIFFERENTIAL (CANCER CENTER ONLY)
BASO#: 0 10*3/uL (ref 0.0–0.2)
BASO%: 0.4 % (ref 0.0–2.0)
EOS ABS: 0.3 10*3/uL (ref 0.0–0.5)
EOS%: 6.2 % (ref 0.0–7.0)
HEMATOCRIT: 38.1 % (ref 34.8–46.6)
HEMOGLOBIN: 12.4 g/dL (ref 11.6–15.9)
LYMPH#: 1.1 10*3/uL (ref 0.9–3.3)
LYMPH%: 21.6 % (ref 14.0–48.0)
MCH: 28.6 pg (ref 26.0–34.0)
MCHC: 32.5 g/dL (ref 32.0–36.0)
MCV: 88 fL (ref 81–101)
MONO#: 0.5 10*3/uL (ref 0.1–0.9)
MONO%: 9 % (ref 0.0–13.0)
NEUT#: 3.2 10*3/uL (ref 1.5–6.5)
NEUT%: 62.8 % (ref 39.6–80.0)
Platelets: 132 10*3/uL — ABNORMAL LOW (ref 145–400)
RBC: 4.34 10*6/uL (ref 3.70–5.32)
RDW: 13.6 % (ref 11.1–15.7)
WBC: 5.1 10*3/uL (ref 3.9–10.0)

## 2016-02-02 LAB — COMPREHENSIVE METABOLIC PANEL
ALBUMIN: 4 g/dL (ref 3.5–5.0)
ALK PHOS: 50 U/L (ref 40–150)
ALT: 22 U/L (ref 0–55)
ANION GAP: 8 meq/L (ref 3–11)
AST: 25 U/L (ref 5–34)
BUN: 20.4 mg/dL (ref 7.0–26.0)
CALCIUM: 9.3 mg/dL (ref 8.4–10.4)
CHLORIDE: 100 meq/L (ref 98–109)
CO2: 28 mEq/L (ref 22–29)
CREATININE: 1.1 mg/dL (ref 0.6–1.1)
EGFR: 54 mL/min/{1.73_m2} — ABNORMAL LOW (ref >90)
Glucose: 170 mg/dl — ABNORMAL HIGH (ref 70–140)
POTASSIUM: 4.5 meq/L (ref 3.5–5.1)
Sodium: 136 mEq/L (ref 136–145)
Total Bilirubin: 0.5 mg/dL (ref 0.20–1.20)
Total Protein: 7.3 g/dL (ref 6.4–8.3)

## 2016-02-02 NOTE — Progress Notes (Signed)
Hematology and Oncology Follow Up Visit  Jasmine Cummings [id number] [birth year] 70 y.o. 02/02/2016   Principle Diagnosis:   Chronic thrombocytopenia-likely medication related  Status post kidney transplant in March of 2013  Current Therapy:    Observation     Interim History:  [name] is is back for followup. She is doing okay. She was last seen back in November. Since then, she has had no problems. She sees her nephrologist and transplant doctors. Have her husband is not doing all that well. Because of his illnesses, she can really not taking vacation.  She's had no change in medications.  She's had no bruising or bleeding. There's been no change in bowel or bladder habits. She's had no rashes.  Overall, her performance status is ECOG 1.  Medications:  Current Outpatient Prescriptions:  .  amLODipine (NORVASC) 10 MG tablet, Take 10 mg by mouth at bedtime., Disp: , Rfl:  .  denosumab (PROLIA) 60 MG/ML SOLN injection, Inject 60 mg into the skin every 6 (six) months., Disp: , Rfl:  .  ezetimibe-simvastatin (VYTORIN) 10-20 MG per tablet, Take 1 tablet by mouth at bedtime. Take Mon, Wed, Fri at HS, Disp: , Rfl:  .  FLUoxetine (PROZAC) 40 MG capsule, Take 40 mg by mouth every morning. , Disp: , Rfl:  .  insulin aspart (NOVOLOG) 100 UNIT/ML injection, Inject 2-10 Units into the skin 4 (four) times daily. Sliding Scale, Disp: , Rfl:  .  Insulin Glargine (LANTUS [place]), Inject 9 Units into the skin daily. 9 units in AM, Disp: , Rfl:  .  metoprolol tartrate (LOPRESSOR) 25 MG tablet, Take 50 mg by mouth 2 (two) times daily. , Disp: , Rfl:  .  Multiple Vitamin (MULTIVITAMIN) capsule, Take 1 capsule by mouth daily.  , Disp: , Rfl:  .  mycophenolate (MYFORTIC) 180 MG EC tablet, Take 180-360 mg by mouth 2 (two) times daily. Takes 2 in the morning and 1 at night, Disp: , Rfl:  .  NOVOFINE 32G X 6 MM MISC, as needed. Reported on 01/06/2016, Disp: , Rfl:  .  omeprazole (PRILOSEC) 20 MG capsule, Take  20 mg by mouth daily., Disp: , Rfl:  .  simvastatin (ZOCOR) 40 MG tablet, Take 40 mg by mouth daily., Disp: , Rfl:  .  sulfamethoxazole-trimethoprim (BACTRIM,SEPTRA) 400-80 MG per tablet, Take 400 tablets by mouth. Take Mon, Wed, Fri one tablet, Disp: , Rfl:  .  tacrolimus (PROGRAF) 1 MG capsule, Take 1-2 mg by mouth 2 (two) times daily. Takes 2 in the morning and 1 at night, Disp: , Rfl:  .  zinc gluconate 50 MG tablet, Take 50 mg by mouth daily., Disp: , Rfl:   Allergies:  Allergies  Allergen Reactions  . Latex Hives    REACTION: swelling  . Other Other (See Comments)    [DERM]    Past Medical History, Surgical history, Social history, and Family History were reviewed and updated.  Review of Systems: As above  Physical Exam:  weight is 121 lb (54.9 kg). Her oral temperature is 98.2 F (36.8 C). Her blood pressure is 138/63 and her pulse is 57 (abnormal).   Well-developed and well-nourished white female in no obvious distress. Head and neck exam shows no other oral lesions. There are no palpable cervical or supraclavicular lymph nodes. Lungs are clear bilaterally. Cardiac exam regular rate and rhythm with no murmurs rubs or bruits. Abdomen is soft. She has well-healed laparotomy scars. There is no fluid wave. Her kidney transplant  is in the right abdomen. There is no palpable liver or spleen tip. Back exam shows no osteoporosis. There is no kyphosis. Extremities shows no clubbing cyanosis or edema. Skin exam shows a few scattered ecchymoses. No petechia. Neurological exam is nonfocal.  Lab Results  Component Value Date   WBC 5.1 02/02/2016   HGB 12.4 02/02/2016   HCT 38.1 02/02/2016   MCV 88 02/02/2016   PLT 132 Platelet count consistent in citrate (L) 02/02/2016     Chemistry      Component Value Date/Time   NA 138 12/11/2013 0447   K 4.7 12/11/2013 0447   CL 106 12/11/2013 0447   CO2 24 12/11/2013 0447   BUN 29 (H) 12/11/2013 0447   CREATININE 1.61 (H) 12/11/2013 0447       Component Value Date/Time   CALCIUM 8.5 12/11/2013 0447   ALKPHOS 60 12/11/2013 0447   AST 47 (H) 12/11/2013 0447   ALT 31 12/11/2013 0447   BILITOT 0.4 12/11/2013 0447         Impression and Plan: Mrs.Jasmine Cummings is a 70 year old white female with thrombocytopenia. We did the ultimate test, that being a bone marrow biopsy. This did not show any malignancy or myelodysplastic changes.  I think that we can probably get her back here now in 8 months. Her platelet count is doing quite well. It certainly is not going down.  Volanda Napoleon, MD 8/2/201711:20 AM

## 2016-02-08 DIAGNOSIS — D696 Thrombocytopenia, unspecified: Secondary | ICD-10-CM | POA: Diagnosis not present

## 2016-02-08 DIAGNOSIS — E1142 Type 2 diabetes mellitus with diabetic polyneuropathy: Secondary | ICD-10-CM | POA: Diagnosis not present

## 2016-02-08 DIAGNOSIS — Z940 Kidney transplant status: Secondary | ICD-10-CM | POA: Diagnosis not present

## 2016-02-08 DIAGNOSIS — F329 Major depressive disorder, single episode, unspecified: Secondary | ICD-10-CM | POA: Diagnosis not present

## 2016-02-08 DIAGNOSIS — Z681 Body mass index (BMI) 19 or less, adult: Secondary | ICD-10-CM | POA: Diagnosis not present

## 2016-02-08 DIAGNOSIS — M859 Disorder of bone density and structure, unspecified: Secondary | ICD-10-CM | POA: Diagnosis not present

## 2016-02-08 DIAGNOSIS — D6489 Other specified anemias: Secondary | ICD-10-CM | POA: Diagnosis not present

## 2016-02-08 DIAGNOSIS — E784 Other hyperlipidemia: Secondary | ICD-10-CM | POA: Diagnosis not present

## 2016-02-08 DIAGNOSIS — E113593 Type 2 diabetes mellitus with proliferative diabetic retinopathy without macular edema, bilateral: Secondary | ICD-10-CM | POA: Diagnosis not present

## 2016-02-08 DIAGNOSIS — I10 Essential (primary) hypertension: Secondary | ICD-10-CM | POA: Diagnosis not present

## 2016-02-15 ENCOUNTER — Other Ambulatory Visit: Payer: Self-pay | Admitting: Gynecology

## 2016-02-15 ENCOUNTER — Ambulatory Visit (INDEPENDENT_AMBULATORY_CARE_PROVIDER_SITE_OTHER): Payer: Medicare Other

## 2016-02-15 DIAGNOSIS — M8580 Other specified disorders of bone density and structure, unspecified site: Secondary | ICD-10-CM

## 2016-02-15 DIAGNOSIS — M899 Disorder of bone, unspecified: Secondary | ICD-10-CM | POA: Diagnosis not present

## 2016-02-29 ENCOUNTER — Telehealth: Payer: Self-pay | Admitting: Gynecology

## 2016-02-29 NOTE — Telephone Encounter (Signed)
PC from pt asking about Prolia and does she continue meds. I explained to her  Started injection 10/2014 at Dr Forde Dandy office. First injection at [facility] 03/2015/  Due after 03/31/16./ Calcium level - 9.3 on 02/02/16 Check ins benefits

## 2016-03-02 NOTE — Telephone Encounter (Signed)
Insurance benefits Medicare deductible met [phone]  , Secondary insurance coverage M81.0   $0 Cost for pt  Prolia SOB , Appoint ment made 04/03/16 at 10

## 2016-03-13 DIAGNOSIS — D72819 Decreased white blood cell count, unspecified: Secondary | ICD-10-CM | POA: Diagnosis not present

## 2016-03-13 DIAGNOSIS — Z940 Kidney transplant status: Secondary | ICD-10-CM | POA: Diagnosis not present

## 2016-04-03 ENCOUNTER — Ambulatory Visit (INDEPENDENT_AMBULATORY_CARE_PROVIDER_SITE_OTHER): Payer: Medicare Other | Admitting: Anesthesiology

## 2016-04-03 DIAGNOSIS — M810 Age-related osteoporosis without current pathological fracture: Secondary | ICD-10-CM

## 2016-04-03 MED ORDER — DENOSUMAB 60 MG/ML ~~LOC~~ SOLN
60.0000 mg | Freq: Once | SUBCUTANEOUS | Status: AC
  Administered 2016-04-03: 60 mg via SUBCUTANEOUS

## 2016-04-03 NOTE — Telephone Encounter (Signed)
Prolia given today , next injection due after 10/03/2016

## 2016-04-05 DIAGNOSIS — N184 Chronic kidney disease, stage 4 (severe): Secondary | ICD-10-CM | POA: Diagnosis not present

## 2016-04-05 DIAGNOSIS — Z681 Body mass index (BMI) 19 or less, adult: Secondary | ICD-10-CM | POA: Diagnosis not present

## 2016-04-05 DIAGNOSIS — E1021 Type 1 diabetes mellitus with diabetic nephropathy: Secondary | ICD-10-CM | POA: Diagnosis not present

## 2016-04-05 DIAGNOSIS — I10 Essential (primary) hypertension: Secondary | ICD-10-CM | POA: Diagnosis not present

## 2016-04-05 DIAGNOSIS — Z23 Encounter for immunization: Secondary | ICD-10-CM | POA: Diagnosis not present

## 2016-05-10 DIAGNOSIS — Z940 Kidney transplant status: Secondary | ICD-10-CM | POA: Diagnosis not present

## 2016-05-10 DIAGNOSIS — E119 Type 2 diabetes mellitus without complications: Secondary | ICD-10-CM | POA: Diagnosis not present

## 2016-05-10 DIAGNOSIS — E785 Hyperlipidemia, unspecified: Secondary | ICD-10-CM | POA: Diagnosis not present

## 2016-05-10 DIAGNOSIS — D631 Anemia in chronic kidney disease: Secondary | ICD-10-CM | POA: Diagnosis not present

## 2016-05-10 DIAGNOSIS — N185 Chronic kidney disease, stage 5: Secondary | ICD-10-CM | POA: Diagnosis not present

## 2016-06-08 DIAGNOSIS — N184 Chronic kidney disease, stage 4 (severe): Secondary | ICD-10-CM | POA: Diagnosis not present

## 2016-06-08 DIAGNOSIS — E1021 Type 1 diabetes mellitus with diabetic nephropathy: Secondary | ICD-10-CM | POA: Diagnosis not present

## 2016-06-08 DIAGNOSIS — E1142 Type 2 diabetes mellitus with diabetic polyneuropathy: Secondary | ICD-10-CM | POA: Diagnosis not present

## 2016-06-08 DIAGNOSIS — Z940 Kidney transplant status: Secondary | ICD-10-CM | POA: Diagnosis not present

## 2016-06-08 DIAGNOSIS — M859 Disorder of bone density and structure, unspecified: Secondary | ICD-10-CM | POA: Diagnosis not present

## 2016-06-08 DIAGNOSIS — K219 Gastro-esophageal reflux disease without esophagitis: Secondary | ICD-10-CM | POA: Diagnosis not present

## 2016-06-08 DIAGNOSIS — D6489 Other specified anemias: Secondary | ICD-10-CM | POA: Diagnosis not present

## 2016-06-08 DIAGNOSIS — E784 Other hyperlipidemia: Secondary | ICD-10-CM | POA: Diagnosis not present

## 2016-06-08 DIAGNOSIS — F329 Major depressive disorder, single episode, unspecified: Secondary | ICD-10-CM | POA: Diagnosis not present

## 2016-06-08 DIAGNOSIS — Z6820 Body mass index (BMI) 20.0-20.9, adult: Secondary | ICD-10-CM | POA: Diagnosis not present

## 2016-06-08 DIAGNOSIS — D696 Thrombocytopenia, unspecified: Secondary | ICD-10-CM | POA: Diagnosis not present

## 2016-06-12 DIAGNOSIS — D72819 Decreased white blood cell count, unspecified: Secondary | ICD-10-CM | POA: Diagnosis not present

## 2016-06-12 DIAGNOSIS — Z940 Kidney transplant status: Secondary | ICD-10-CM | POA: Diagnosis not present

## 2016-06-23 DIAGNOSIS — E103511 Type 1 diabetes mellitus with proliferative diabetic retinopathy with macular edema, right eye: Secondary | ICD-10-CM | POA: Diagnosis not present

## 2016-08-07 ENCOUNTER — Other Ambulatory Visit: Payer: Self-pay | Admitting: Gynecology

## 2016-08-07 DIAGNOSIS — Z1231 Encounter for screening mammogram for malignant neoplasm of breast: Secondary | ICD-10-CM

## 2016-08-16 DIAGNOSIS — Z6820 Body mass index (BMI) 20.0-20.9, adult: Secondary | ICD-10-CM | POA: Diagnosis not present

## 2016-08-16 DIAGNOSIS — N184 Chronic kidney disease, stage 4 (severe): Secondary | ICD-10-CM | POA: Diagnosis not present

## 2016-08-16 DIAGNOSIS — I10 Essential (primary) hypertension: Secondary | ICD-10-CM | POA: Diagnosis not present

## 2016-08-16 DIAGNOSIS — E1021 Type 1 diabetes mellitus with diabetic nephropathy: Secondary | ICD-10-CM | POA: Diagnosis not present

## 2016-08-30 DIAGNOSIS — Z940 Kidney transplant status: Secondary | ICD-10-CM | POA: Diagnosis not present

## 2016-08-30 DIAGNOSIS — D72819 Decreased white blood cell count, unspecified: Secondary | ICD-10-CM | POA: Diagnosis not present

## 2016-08-31 ENCOUNTER — Telehealth: Payer: Self-pay | Admitting: Gynecology

## 2016-08-31 NOTE — Telephone Encounter (Signed)
Please check with [employer] because of patients renal status as recommended by Nephrologist and Endocrinologist (Prolia)

## 2016-08-31 NOTE — Telephone Encounter (Addendum)
Dr Forde Dandy gave first Prolia injection 04/2014 due to Dr Justin Mend recommended Prolia treatment for osteopenia due to renal transplant . No past history of oral medication for osteopenia. All bone density at Dr Ubaldo Glassing and [facility] osteopenia.  No insurance change for 2018. Prolia due October 03 2016. I will forward this information to Dr Toney Rakes. Medicare will not cover Prolia for osteopenia. We can reach out to [facility] to check on Prolia due to Renal transplant or check on Reclast.

## 2016-09-05 DIAGNOSIS — E119 Type 2 diabetes mellitus without complications: Secondary | ICD-10-CM | POA: Diagnosis not present

## 2016-09-05 DIAGNOSIS — Z940 Kidney transplant status: Secondary | ICD-10-CM | POA: Diagnosis not present

## 2016-09-05 DIAGNOSIS — D631 Anemia in chronic kidney disease: Secondary | ICD-10-CM | POA: Diagnosis not present

## 2016-09-05 DIAGNOSIS — N185 Chronic kidney disease, stage 5: Secondary | ICD-10-CM | POA: Diagnosis not present

## 2016-09-05 DIAGNOSIS — E785 Hyperlipidemia, unspecified: Secondary | ICD-10-CM | POA: Diagnosis not present

## 2016-09-05 NOTE — Telephone Encounter (Addendum)
[employer] update M85.88 dx for osteopenia, Prolia not covered under Medicare guidelines. Reclast covered however pt is a Renal Transplant patient. Review note from 08/31/16. Dr Toney Rakes do you want Anderson Malta to call  Dr Forde Dandy (PCP) or Dr Justin Mend nephrologist [facility] )  For you to talk about this patient to clear pt for Reclast?

## 2016-09-05 NOTE — Telephone Encounter (Signed)
Yes

## 2016-09-07 ENCOUNTER — Ambulatory Visit: Payer: Medicare Other

## 2016-09-08 NOTE — Telephone Encounter (Signed)
Inform patient that her insurance will not cover Prolia for osteopenia even though she is at risk for fracture based on Frax analysis. Reclast IV is covered but is contraindicated in her case because she has had history of chronic renal disease and has had kidney transplant in 2013. I would recommend she begin taking Fosamax 70 milligrams daily. Call in 30 tablets with 11 refills.

## 2016-09-08 NOTE — Telephone Encounter (Signed)
Dr.Fernandez you wanted to speak with Dr.Juliona Vales [facility] kidney [facility]) his cell # is [phone]

## 2016-09-12 MED ORDER — ALENDRONATE SODIUM 70 MG PO TABS: 70.0000 mg | ORAL_TABLET | ORAL | 11 refills | Status: DC

## 2016-09-12 NOTE — Telephone Encounter (Signed)
Pt informed with the below note, Rx sent. 

## 2016-09-25 ENCOUNTER — Telehealth: Payer: Self-pay | Admitting: Gynecology

## 2016-09-25 NOTE — Telephone Encounter (Signed)
PC from patient wants to know how much is Prolia I told her $2100 twice a year. She will call her insurance company and verify her benefits. She has RX for Fosamax.

## 2016-09-28 ENCOUNTER — Ambulatory Visit: Payer: Medicare Other

## 2016-10-04 ENCOUNTER — Other Ambulatory Visit: Payer: Medicare Other

## 2016-10-04 ENCOUNTER — Ambulatory Visit: Payer: Medicare Other | Admitting: Hematology & Oncology

## 2016-10-09 DIAGNOSIS — Z792 Long term (current) use of antibiotics: Secondary | ICD-10-CM | POA: Diagnosis not present

## 2016-10-09 DIAGNOSIS — E1022 Type 1 diabetes mellitus with diabetic chronic kidney disease: Secondary | ICD-10-CM | POA: Diagnosis not present

## 2016-10-09 DIAGNOSIS — E119 Type 2 diabetes mellitus without complications: Secondary | ICD-10-CM | POA: Diagnosis not present

## 2016-10-09 DIAGNOSIS — D899 Disorder involving the immune mechanism, unspecified: Secondary | ICD-10-CM | POA: Diagnosis not present

## 2016-10-09 DIAGNOSIS — N184 Chronic kidney disease, stage 4 (severe): Secondary | ICD-10-CM | POA: Diagnosis not present

## 2016-10-09 DIAGNOSIS — Z940 Kidney transplant status: Secondary | ICD-10-CM | POA: Diagnosis not present

## 2016-10-09 DIAGNOSIS — Z79899 Other long term (current) drug therapy: Secondary | ICD-10-CM | POA: Diagnosis not present

## 2016-10-09 DIAGNOSIS — M810 Age-related osteoporosis without current pathological fracture: Secondary | ICD-10-CM | POA: Diagnosis not present

## 2016-10-09 DIAGNOSIS — Z794 Long term (current) use of insulin: Secondary | ICD-10-CM | POA: Diagnosis not present

## 2016-10-09 DIAGNOSIS — D8989 Other specified disorders involving the immune mechanism, not elsewhere classified: Secondary | ICD-10-CM | POA: Diagnosis not present

## 2016-10-09 DIAGNOSIS — E785 Hyperlipidemia, unspecified: Secondary | ICD-10-CM | POA: Diagnosis not present

## 2016-10-09 DIAGNOSIS — Z4822 Encounter for aftercare following kidney transplant: Secondary | ICD-10-CM | POA: Diagnosis not present

## 2016-10-09 DIAGNOSIS — R609 Edema, unspecified: Secondary | ICD-10-CM | POA: Diagnosis not present

## 2016-10-09 DIAGNOSIS — I10 Essential (primary) hypertension: Secondary | ICD-10-CM | POA: Diagnosis not present

## 2016-10-09 DIAGNOSIS — I129 Hypertensive chronic kidney disease with stage 1 through stage 4 chronic kidney disease, or unspecified chronic kidney disease: Secondary | ICD-10-CM | POA: Diagnosis not present

## 2016-10-12 ENCOUNTER — Ambulatory Visit (HOSPITAL_BASED_OUTPATIENT_CLINIC_OR_DEPARTMENT_OTHER): Payer: Medicare Other | Admitting: Family

## 2016-10-12 ENCOUNTER — Other Ambulatory Visit (HOSPITAL_BASED_OUTPATIENT_CLINIC_OR_DEPARTMENT_OTHER): Payer: Medicare Other

## 2016-10-12 VITALS — BP 155/58 | HR 61 | Temp 98.4°F | Resp 17 | Wt 125.0 lb

## 2016-10-12 DIAGNOSIS — D696 Thrombocytopenia, unspecified: Secondary | ICD-10-CM | POA: Diagnosis not present

## 2016-10-12 LAB — CBC WITH DIFFERENTIAL (CANCER CENTER ONLY)
BASO#: 0 10*3/uL (ref 0.0–0.2)
BASO%: 0.6 % (ref 0.0–2.0)
EOS ABS: 0.3 10*3/uL (ref 0.0–0.5)
EOS%: 5.2 % (ref 0.0–7.0)
HEMATOCRIT: 39.6 % (ref 34.8–46.6)
HGB: 13.3 g/dL (ref 11.6–15.9)
LYMPH#: 1.2 10*3/uL (ref 0.9–3.3)
LYMPH%: 25.2 % (ref 14.0–48.0)
MCH: 29.8 pg (ref 26.0–34.0)
MCHC: 33.6 g/dL (ref 32.0–36.0)
MCV: 89 fL (ref 81–101)
MONO#: 0.5 10*3/uL (ref 0.1–0.9)
MONO%: 11 % (ref 0.0–13.0)
NEUT#: 2.8 10*3/uL (ref 1.5–6.5)
NEUT%: 58 % (ref 39.6–80.0)
RBC: 4.47 10*6/uL (ref 3.70–5.32)
RDW: 12.4 % (ref 11.1–15.7)
WBC: 4.8 10*3/uL (ref 3.9–10.0)

## 2016-10-12 NOTE — Progress Notes (Signed)
Hematology and Oncology Follow Up Visit  Jasmine Cummings [id number] [birth year] 71 y.o. 10/12/2016   Principle Diagnosis:  Chronic thrombocytopenia - likely medication related Status post kidney transplant in March of 2013  Current Therapy:   Observation   Interim History:  Jasmine Cummings is here today for follow-up. She is doing well and has no complaints at this time. Her platelet count is stable at 125. No anemia. She does bruise easily but denies any episodes of bleeding or petechial rash.  She has had no issue with infection. No fever, chills, n/v, cough, rash, dizziness, SOB, chest pain, palpitations, abdominal pain or changes in bowel or bladder habits.  She states that her Prograf dose was recently reduced and she has a follow-up appointment with her [facility] transplant team next week.  No swelling or tenderness in her extremities. The neuropathy in her feet is unchanged.  No falls or syncopal episodes. No lymphadenopathy found on exam.  She has maintained a good appetite and is staying well hydrated. Her weight is stable.   ECOG Performance Status: 0 - Asymptomatic  Medications:  Allergies as of 10/12/2016      Reactions   Latex Hives   REACTION: swelling   Other Other (See Comments)   [DERM]      Medication List       Accurate as of 10/12/16 10:57 AM. Always use your most recent med list.          alendronate 70 MG tablet Commonly known as:  FOSAMAX Take 1 tablet (70 mg total) by mouth every 7 (seven) days. Take with a full glass of water on an empty stomach.   amLODipine 10 MG tablet Commonly known as:  NORVASC Take 10 mg by mouth at bedtime.   ezetimibe-simvastatin 10-20 MG tablet Commonly known as:  VYTORIN Take 1 tablet by mouth at bedtime. Take Mon, Wed, Fri at [facility]   FLUoxetine 40 MG capsule Commonly known as:  PROZAC Take 40 mg by mouth every morning.   insulin aspart 100 UNIT/ML injection Commonly known as:  novoLOG Inject 2-10 Units into the  skin 4 (four) times daily. Sliding Scale   LANTUS [place] Inject 9 Units into the skin daily. 9 units in AM   metoprolol tartrate 25 MG tablet Commonly known as:  LOPRESSOR Take 50 mg by mouth 2 (two) times daily.   multivitamin capsule Take 1 capsule by mouth daily.   mycophenolate 180 MG EC tablet Commonly known as:  MYFORTIC Take 180-360 mg by mouth 2 (two) times daily. Takes 2 in the morning and 1 at night   NOVOFINE 32G X 6 MM Misc Generic drug:  Insulin Pen Needle as needed. Reported on 01/06/2016   omeprazole 20 MG capsule Commonly known as:  PRILOSEC Take 20 mg by mouth daily.   PROLIA 60 MG/ML Soln injection Generic drug:  denosumab Inject 60 mg into the skin every 6 (six) months.   simvastatin 40 MG tablet Commonly known as:  ZOCOR Take 40 mg by mouth daily.   sulfamethoxazole-trimethoprim 400-80 MG tablet Commonly known as:  BACTRIM,SEPTRA Take 400 tablets by mouth. Take Mon, Wed, Fri one tablet   tacrolimus 1 MG capsule Commonly known as:  PROGRAF Take 1-2 mg by mouth 2 (two) times daily. Takes 2 in the morning and 1 at night   zinc gluconate 50 MG tablet Take 50 mg by mouth daily.       Allergies:  Allergies  Allergen Reactions  . Latex Hives  REACTION: swelling  . Other Other (See Comments)    [DERM]    Past Medical History, Surgical history, Social history, and Family History were reviewed and updated.  Review of Systems: All other 10 point review of systems is negative.   Physical Exam:  weight is 125 lb (56.7 kg). Her oral temperature is 98.4 F (36.9 C). Her blood pressure is 155/58 (abnormal) and her pulse is 61. Her respiration is 17 and oxygen saturation is 98%.   Wt Readings from Last 3 Encounters:  10/12/16 125 lb (56.7 kg)  02/02/16 121 lb (54.9 kg)  01/06/16 119 lb (54 kg)    Ocular: Sclerae unicteric, pupils equal, round and reactive to light Ear-nose-throat: Oropharynx clear, dentition fair Lymphatic: No cervical,  supraclavicular or axillary adenopathy Lungs no rales or rhonchi, good excursion bilaterally Heart regular rate and rhythm, no murmur appreciated Abd soft, nontender, positive bowel sounds, no liver or spleen tip palpated on exam, no fluid wave MSK no focal spinal tenderness, no joint edema Neuro: non-focal, well-oriented, appropriate affect Breasts: Deferred  Lab Results  Component Value Date   WBC 4.8 10/12/2016   HGB 13.3 10/12/2016   HCT 39.6 10/12/2016   MCV 89 10/12/2016   PLT 125 Platelet count consistent in citrate (L) 10/12/2016   Lab Results  Component Value Date   FERRITIN 203 08/24/2011   IRON 115 08/24/2011   TIBC 355 08/24/2011   UIBC 240 08/24/2011   IRONPCTSAT 32 08/24/2011   Lab Results  Component Value Date   RBC 4.47 10/12/2016   No results found for: KPAFRELGTCHN, LAMBDASER, KAPLAMBRATIO No results found for: IGGSERUM, IGA, IGMSERUM No results found for: Odetta Pink, SPEI   Chemistry      Component Value Date/Time   NA 136 02/02/2016 1014   K 4.5 02/02/2016 1014   CL 106 12/11/2013 0447   CO2 28 02/02/2016 1014   BUN 20.4 02/02/2016 1014   CREATININE 1.1 02/02/2016 1014      Component Value Date/Time   CALCIUM 9.3 02/02/2016 1014   ALKPHOS 50 02/02/2016 1014   AST 25 02/02/2016 1014   ALT 22 02/02/2016 1014   BILITOT 0.50 02/02/2016 1014     Impression and Plan: Jasmine Cummings is a 71 yo caucasian female with thrombocytopenia. Bone marrow biopsy did not show any malignancy or myelodysplastic changes. She continues to do well and has no complaints at this time. She bruises easily but is otherwise asymptomatic. Platelet count today is stable at 125, no anemia.  We will plan to see her back in 1 year for repeat lab work and follow-up.  I spent 15 minutes face to face counseling with the patient.  She will contact our office with any questions or concerns. We can certainly see her sooner if need  be.   Eliezer Bottom, NP 4/12/201810:57 AM

## 2016-10-16 ENCOUNTER — Ambulatory Visit
Admission: RE | Admit: 2016-10-16 | Discharge: 2016-10-16 | Disposition: A | Discharge status: Home / Self Care | Payer: Medicare Other | Source: Ambulatory Visit | Attending: Gynecology | Admitting: Gynecology

## 2016-10-16 DIAGNOSIS — Z1231 Encounter for screening mammogram for malignant neoplasm of breast: Secondary | ICD-10-CM

## 2016-11-08 DIAGNOSIS — Z940 Kidney transplant status: Secondary | ICD-10-CM | POA: Diagnosis not present

## 2016-11-08 DIAGNOSIS — E1021 Type 1 diabetes mellitus with diabetic nephropathy: Secondary | ICD-10-CM | POA: Diagnosis not present

## 2016-11-08 DIAGNOSIS — F329 Major depressive disorder, single episode, unspecified: Secondary | ICD-10-CM | POA: Diagnosis not present

## 2016-11-08 DIAGNOSIS — I10 Essential (primary) hypertension: Secondary | ICD-10-CM | POA: Diagnosis not present

## 2016-11-15 ENCOUNTER — Encounter: Payer: Self-pay | Admitting: Gynecology

## 2016-11-29 DIAGNOSIS — F329 Major depressive disorder, single episode, unspecified: Secondary | ICD-10-CM | POA: Diagnosis not present

## 2016-12-28 DIAGNOSIS — F329 Major depressive disorder, single episode, unspecified: Secondary | ICD-10-CM | POA: Diagnosis not present

## 2017-01-08 ENCOUNTER — Ambulatory Visit (INDEPENDENT_AMBULATORY_CARE_PROVIDER_SITE_OTHER): Payer: Medicare Other | Admitting: Gynecology

## 2017-01-08 ENCOUNTER — Encounter: Payer: Self-pay | Admitting: Gynecology

## 2017-01-08 VITALS — BP 136/80 | Ht 66.0 in | Wt 121.0 lb

## 2017-01-08 DIAGNOSIS — Z01419 Encounter for gynecological examination (general) (routine) without abnormal findings: Secondary | ICD-10-CM | POA: Diagnosis not present

## 2017-01-08 DIAGNOSIS — M8580 Other specified disorders of bone density and structure, unspecified site: Secondary | ICD-10-CM

## 2017-01-08 NOTE — Progress Notes (Signed)
Jasmine Cummings 10/27/1945 [id number]   History:    71 y.o.  for annual gyn exam with history of osteopenia but with high Frax risk of hip fracture 3.2% noted on bone density study in 2015. Patient had been started on Prolia for only year but discontinued it because of cost and then she was placed on oral bisphosphonate Fosamax 70 mg every weekly but was discontinued per recommendation of her nephrologist. Her bone density study in 2017 demonstrated statistically improvement of the AP spine no change in right or left hip when compared with 2015. Patient had a right kidney transplant in 2013 and is being followed by Dr. Justin Mend nephrologist at [facility]. She suffers from type 1 diabetes where she is under the care of Dr. Roque Cash endocrinologist.Her ophthalmologist is Dr. Ebony Cargo which cc several times a year.Patient stated her last colonoscopy was in 2011 which was normal. Patient several years ago had been on hormone replacement therapy currently not on anything. Patient with no previous history of abnormal Pap smears.  Past medical history,surgical history, family history and social history were all reviewed and documented in the EPIC chart.  Gynecologic History No LMP recorded. Patient is postmenopausal. Contraception: post menopausal status Last Pap: 2015. Results were: normal Last mammogram: 2018. Results were: normal  Obstetric History OB History  Gravida Para Term Preterm AB Living  2 2       2   SAB TAB Ectopic Multiple Live Births        1      # Outcome Date GA Lbr Len/2nd Weight Sex Delivery Anes PTL Lv  2 Para           1 Para                ROS: A ROS was performed and pertinent positives and negatives are included in the history.  GENERAL: No fevers or chills. HEENT: No change in vision, no earache, sore throat or sinus congestion. NECK: No pain or stiffness. CARDIOVASCULAR: No chest pain or pressure. No palpitations. PULMONARY: No shortness of breath,  cough or wheeze. GASTROINTESTINAL: No abdominal pain, nausea, vomiting or diarrhea, melena or bright red blood per rectum. GENITOURINARY: No urinary frequency, urgency, hesitancy or dysuria. MUSCULOSKELETAL: No joint or muscle pain, no back pain, no recent trauma. DERMATOLOGIC: No rash, no itching, no lesions. ENDOCRINE: No polyuria, polydipsia, no heat or cold intolerance. No recent change in weight. HEMATOLOGICAL: No anemia or easy bruising or bleeding. NEUROLOGIC: No headache, seizures, numbness, tingling or weakness. PSYCHIATRIC: No depression, no loss of interest in normal activity or change in sleep pattern.     Exam: chaperone present  BP 136/80   Ht 5\' 6"  (1.676 m)   Wt 121 lb (54.9 kg)   BMI 19.53 kg/m   Body mass index is 19.53 kg/m.  General appearance : Well developed well nourished female. No acute distress HEENT: Eyes: no retinal hemorrhage or exudates,  Neck supple, trachea midline, no carotid bruits, no thyroidmegaly Lungs: Clear to auscultation, no rhonchi or wheezes, or rib retractions  Heart: Regular rate and rhythm, no murmurs or gallops Breast:Examined in sitting and supine position were symmetrical in appearance, no palpable masses or tenderness,  no skin retraction, no nipple inversion, no nipple discharge, no skin discoloration, no axillary or supraclavicular lymphadenopathy Abdomen: no palpable masses or tenderness, no rebound or guarding Extremities: no edema or skin discoloration or tenderness  Pelvic:  Bartholin, Urethra, Skene Glands: Within normal limits  Vagina: No gross lesions or discharge, atrophic changes  Cervix: No gross lesions or discharge  Uterus  anteverted, normal size, shape and consistency, non-tender and mobile  Adnexa  Without masses or tenderness  Anus and perineum  normal   Rectovaginal  normal sphincter tone without palpated masses or tenderness             Hemoccult PCP provides     Assessment/Plan:  71 y.o. female for  annual exam with osteopenia high-risk for hip fracture based on bone density study in 2015. Bone density study improvement at the AP spine no change in hip. We'll check vitamin D level today. She was encouraged to continue her calcium and vitamin D and weightbearing exercises. She will readdress these issues as well with her nephrologist and her endocrinologist. Pap smear not indicated.   Terrance Mass MD, 11:11 AM 01/08/2017

## 2017-01-09 LAB — VITAMIN D 25 HYDROXY (VIT D DEFICIENCY, FRACTURES): Vit D, 25-Hydroxy: 51 ng/mL (ref 30–100)

## 2017-01-16 DIAGNOSIS — I10 Essential (primary) hypertension: Secondary | ICD-10-CM | POA: Diagnosis not present

## 2017-01-16 DIAGNOSIS — D649 Anemia, unspecified: Secondary | ICD-10-CM | POA: Diagnosis not present

## 2017-01-16 DIAGNOSIS — Z681 Body mass index (BMI) 19 or less, adult: Secondary | ICD-10-CM | POA: Diagnosis not present

## 2017-01-16 DIAGNOSIS — R1310 Dysphagia, unspecified: Secondary | ICD-10-CM | POA: Diagnosis not present

## 2017-01-16 DIAGNOSIS — M8580 Other specified disorders of bone density and structure, unspecified site: Secondary | ICD-10-CM | POA: Diagnosis not present

## 2017-01-16 DIAGNOSIS — F329 Major depressive disorder, single episode, unspecified: Secondary | ICD-10-CM | POA: Diagnosis not present

## 2017-01-16 DIAGNOSIS — E113599 Type 2 diabetes mellitus with proliferative diabetic retinopathy without macular edema, unspecified eye: Secondary | ICD-10-CM | POA: Diagnosis not present

## 2017-01-16 DIAGNOSIS — Z1389 Encounter for screening for other disorder: Secondary | ICD-10-CM | POA: Diagnosis not present

## 2017-01-16 DIAGNOSIS — E1021 Type 1 diabetes mellitus with diabetic nephropathy: Secondary | ICD-10-CM | POA: Diagnosis not present

## 2017-01-16 DIAGNOSIS — E784 Other hyperlipidemia: Secondary | ICD-10-CM | POA: Diagnosis not present

## 2017-01-16 DIAGNOSIS — D696 Thrombocytopenia, unspecified: Secondary | ICD-10-CM | POA: Diagnosis not present

## 2017-01-16 DIAGNOSIS — Z940 Kidney transplant status: Secondary | ICD-10-CM | POA: Diagnosis not present

## 2017-01-17 DIAGNOSIS — Z940 Kidney transplant status: Secondary | ICD-10-CM | POA: Diagnosis not present

## 2017-01-17 DIAGNOSIS — E119 Type 2 diabetes mellitus without complications: Secondary | ICD-10-CM | POA: Diagnosis not present

## 2017-01-17 DIAGNOSIS — E785 Hyperlipidemia, unspecified: Secondary | ICD-10-CM | POA: Diagnosis not present

## 2017-01-17 DIAGNOSIS — D631 Anemia in chronic kidney disease: Secondary | ICD-10-CM | POA: Diagnosis not present

## 2017-01-17 DIAGNOSIS — N189 Chronic kidney disease, unspecified: Secondary | ICD-10-CM | POA: Diagnosis not present

## 2017-02-06 DIAGNOSIS — N184 Chronic kidney disease, stage 4 (severe): Secondary | ICD-10-CM | POA: Diagnosis not present

## 2017-02-06 DIAGNOSIS — Z681 Body mass index (BMI) 19 or less, adult: Secondary | ICD-10-CM | POA: Diagnosis not present

## 2017-02-06 DIAGNOSIS — I10 Essential (primary) hypertension: Secondary | ICD-10-CM | POA: Diagnosis not present

## 2017-02-06 DIAGNOSIS — F329 Major depressive disorder, single episode, unspecified: Secondary | ICD-10-CM | POA: Diagnosis not present

## 2017-02-06 DIAGNOSIS — E1021 Type 1 diabetes mellitus with diabetic nephropathy: Secondary | ICD-10-CM | POA: Diagnosis not present

## 2017-02-09 DIAGNOSIS — E103511 Type 1 diabetes mellitus with proliferative diabetic retinopathy with macular edema, right eye: Secondary | ICD-10-CM | POA: Diagnosis not present

## 2017-02-12 DIAGNOSIS — F329 Major depressive disorder, single episode, unspecified: Secondary | ICD-10-CM | POA: Diagnosis not present

## 2017-03-14 DIAGNOSIS — F329 Major depressive disorder, single episode, unspecified: Secondary | ICD-10-CM | POA: Diagnosis not present

## 2017-05-03 DIAGNOSIS — Z23 Encounter for immunization: Secondary | ICD-10-CM | POA: Diagnosis not present

## 2017-05-09 DIAGNOSIS — I10 Essential (primary) hypertension: Secondary | ICD-10-CM | POA: Diagnosis not present

## 2017-05-09 DIAGNOSIS — Z940 Kidney transplant status: Secondary | ICD-10-CM | POA: Diagnosis not present

## 2017-05-09 DIAGNOSIS — Z794 Long term (current) use of insulin: Secondary | ICD-10-CM | POA: Diagnosis not present

## 2017-05-09 DIAGNOSIS — F329 Major depressive disorder, single episode, unspecified: Secondary | ICD-10-CM | POA: Diagnosis not present

## 2017-05-09 DIAGNOSIS — E1021 Type 1 diabetes mellitus with diabetic nephropathy: Secondary | ICD-10-CM | POA: Diagnosis not present

## 2017-05-11 DIAGNOSIS — Z79899 Other long term (current) drug therapy: Secondary | ICD-10-CM | POA: Diagnosis not present

## 2017-05-23 DIAGNOSIS — E113599 Type 2 diabetes mellitus with proliferative diabetic retinopathy without macular edema, unspecified eye: Secondary | ICD-10-CM | POA: Diagnosis not present

## 2017-05-23 DIAGNOSIS — E1021 Type 1 diabetes mellitus with diabetic nephropathy: Secondary | ICD-10-CM | POA: Diagnosis not present

## 2017-05-23 DIAGNOSIS — I10 Essential (primary) hypertension: Secondary | ICD-10-CM | POA: Diagnosis not present

## 2017-05-23 DIAGNOSIS — D696 Thrombocytopenia, unspecified: Secondary | ICD-10-CM | POA: Diagnosis not present

## 2017-05-23 DIAGNOSIS — Z940 Kidney transplant status: Secondary | ICD-10-CM | POA: Diagnosis not present

## 2017-05-23 DIAGNOSIS — F3289 Other specified depressive episodes: Secondary | ICD-10-CM | POA: Diagnosis not present

## 2017-05-23 DIAGNOSIS — K219 Gastro-esophageal reflux disease without esophagitis: Secondary | ICD-10-CM | POA: Diagnosis not present

## 2017-05-23 DIAGNOSIS — Z681 Body mass index (BMI) 19 or less, adult: Secondary | ICD-10-CM | POA: Diagnosis not present

## 2017-07-11 DIAGNOSIS — Z940 Kidney transplant status: Secondary | ICD-10-CM | POA: Diagnosis not present

## 2017-07-25 DIAGNOSIS — E1122 Type 2 diabetes mellitus with diabetic chronic kidney disease: Secondary | ICD-10-CM | POA: Diagnosis not present

## 2017-07-25 DIAGNOSIS — E785 Hyperlipidemia, unspecified: Secondary | ICD-10-CM | POA: Diagnosis not present

## 2017-07-25 DIAGNOSIS — N189 Chronic kidney disease, unspecified: Secondary | ICD-10-CM | POA: Diagnosis not present

## 2017-07-25 DIAGNOSIS — D631 Anemia in chronic kidney disease: Secondary | ICD-10-CM | POA: Diagnosis not present

## 2017-07-25 DIAGNOSIS — Z940 Kidney transplant status: Secondary | ICD-10-CM | POA: Diagnosis not present

## 2017-08-13 DIAGNOSIS — E103591 Type 1 diabetes mellitus with proliferative diabetic retinopathy without macular edema, right eye: Secondary | ICD-10-CM | POA: Diagnosis not present

## 2017-08-16 DIAGNOSIS — N184 Chronic kidney disease, stage 4 (severe): Secondary | ICD-10-CM | POA: Diagnosis not present

## 2017-08-16 DIAGNOSIS — Z794 Long term (current) use of insulin: Secondary | ICD-10-CM | POA: Diagnosis not present

## 2017-08-16 DIAGNOSIS — Z681 Body mass index (BMI) 19 or less, adult: Secondary | ICD-10-CM | POA: Diagnosis not present

## 2017-08-16 DIAGNOSIS — E1021 Type 1 diabetes mellitus with diabetic nephropathy: Secondary | ICD-10-CM | POA: Diagnosis not present

## 2017-09-17 ENCOUNTER — Other Ambulatory Visit: Payer: Self-pay | Admitting: Gynecology

## 2017-09-17 DIAGNOSIS — Z1231 Encounter for screening mammogram for malignant neoplasm of breast: Secondary | ICD-10-CM

## 2017-10-02 DIAGNOSIS — Z681 Body mass index (BMI) 19 or less, adult: Secondary | ICD-10-CM | POA: Diagnosis not present

## 2017-10-02 DIAGNOSIS — Z940 Kidney transplant status: Secondary | ICD-10-CM | POA: Diagnosis not present

## 2017-10-02 DIAGNOSIS — E7849 Other hyperlipidemia: Secondary | ICD-10-CM | POA: Diagnosis not present

## 2017-10-02 DIAGNOSIS — D649 Anemia, unspecified: Secondary | ICD-10-CM | POA: Diagnosis not present

## 2017-10-02 DIAGNOSIS — Z794 Long term (current) use of insulin: Secondary | ICD-10-CM | POA: Diagnosis not present

## 2017-10-02 DIAGNOSIS — E1021 Type 1 diabetes mellitus with diabetic nephropathy: Secondary | ICD-10-CM | POA: Diagnosis not present

## 2017-10-02 DIAGNOSIS — I10 Essential (primary) hypertension: Secondary | ICD-10-CM | POA: Diagnosis not present

## 2017-10-02 DIAGNOSIS — I6523 Occlusion and stenosis of bilateral carotid arteries: Secondary | ICD-10-CM | POA: Diagnosis not present

## 2017-10-02 DIAGNOSIS — E113599 Type 2 diabetes mellitus with proliferative diabetic retinopathy without macular edema, unspecified eye: Secondary | ICD-10-CM | POA: Diagnosis not present

## 2017-10-02 DIAGNOSIS — Z1389 Encounter for screening for other disorder: Secondary | ICD-10-CM | POA: Diagnosis not present

## 2017-10-10 DIAGNOSIS — Z79899 Other long term (current) drug therapy: Secondary | ICD-10-CM | POA: Diagnosis not present

## 2017-10-10 DIAGNOSIS — Z7952 Long term (current) use of systemic steroids: Secondary | ICD-10-CM | POA: Diagnosis not present

## 2017-10-10 DIAGNOSIS — Z940 Kidney transplant status: Secondary | ICD-10-CM | POA: Diagnosis not present

## 2017-10-10 DIAGNOSIS — Z5181 Encounter for therapeutic drug level monitoring: Secondary | ICD-10-CM | POA: Diagnosis not present

## 2017-10-10 DIAGNOSIS — E1022 Type 1 diabetes mellitus with diabetic chronic kidney disease: Secondary | ICD-10-CM | POA: Diagnosis not present

## 2017-10-10 DIAGNOSIS — I10 Essential (primary) hypertension: Secondary | ICD-10-CM | POA: Diagnosis not present

## 2017-10-10 DIAGNOSIS — N184 Chronic kidney disease, stage 4 (severe): Secondary | ICD-10-CM | POA: Diagnosis not present

## 2017-10-10 DIAGNOSIS — I129 Hypertensive chronic kidney disease with stage 1 through stage 4 chronic kidney disease, or unspecified chronic kidney disease: Secondary | ICD-10-CM | POA: Diagnosis not present

## 2017-10-10 DIAGNOSIS — E785 Hyperlipidemia, unspecified: Secondary | ICD-10-CM | POA: Diagnosis not present

## 2017-10-10 DIAGNOSIS — Z792 Long term (current) use of antibiotics: Secondary | ICD-10-CM | POA: Diagnosis not present

## 2017-10-10 DIAGNOSIS — E109 Type 1 diabetes mellitus without complications: Secondary | ICD-10-CM | POA: Diagnosis not present

## 2017-10-10 DIAGNOSIS — M810 Age-related osteoporosis without current pathological fracture: Secondary | ICD-10-CM | POA: Diagnosis not present

## 2017-10-10 DIAGNOSIS — Z4822 Encounter for aftercare following kidney transplant: Secondary | ICD-10-CM | POA: Diagnosis not present

## 2017-10-12 ENCOUNTER — Inpatient Hospital Stay: Payer: Medicare Other | Attending: Hematology & Oncology

## 2017-10-12 ENCOUNTER — Inpatient Hospital Stay: Payer: Medicare Other | Admitting: Family

## 2017-10-18 ENCOUNTER — Ambulatory Visit
Admission: RE | Admit: 2017-10-18 | Discharge: 2017-10-18 | Disposition: A | Discharge status: Home / Self Care | Payer: Medicare Other | Source: Ambulatory Visit | Attending: Gynecology | Admitting: Gynecology

## 2017-10-18 DIAGNOSIS — Z1231 Encounter for screening mammogram for malignant neoplasm of breast: Secondary | ICD-10-CM

## 2017-11-28 DIAGNOSIS — E1122 Type 2 diabetes mellitus with diabetic chronic kidney disease: Secondary | ICD-10-CM | POA: Diagnosis not present

## 2017-11-28 DIAGNOSIS — N189 Chronic kidney disease, unspecified: Secondary | ICD-10-CM | POA: Diagnosis not present

## 2017-11-28 DIAGNOSIS — I129 Hypertensive chronic kidney disease with stage 1 through stage 4 chronic kidney disease, or unspecified chronic kidney disease: Secondary | ICD-10-CM | POA: Diagnosis not present

## 2017-11-28 DIAGNOSIS — Z940 Kidney transplant status: Secondary | ICD-10-CM | POA: Diagnosis not present

## 2017-11-28 DIAGNOSIS — E119 Type 2 diabetes mellitus without complications: Secondary | ICD-10-CM | POA: Diagnosis not present

## 2017-11-28 DIAGNOSIS — D631 Anemia in chronic kidney disease: Secondary | ICD-10-CM | POA: Diagnosis not present

## 2017-11-28 DIAGNOSIS — D693 Immune thrombocytopenic purpura: Secondary | ICD-10-CM | POA: Diagnosis not present

## 2017-12-13 DIAGNOSIS — Z794 Long term (current) use of insulin: Secondary | ICD-10-CM | POA: Diagnosis not present

## 2017-12-13 DIAGNOSIS — I10 Essential (primary) hypertension: Secondary | ICD-10-CM | POA: Diagnosis not present

## 2017-12-13 DIAGNOSIS — K219 Gastro-esophageal reflux disease without esophagitis: Secondary | ICD-10-CM | POA: Diagnosis not present

## 2017-12-13 DIAGNOSIS — E1029 Type 1 diabetes mellitus with other diabetic kidney complication: Secondary | ICD-10-CM | POA: Diagnosis not present

## 2017-12-13 DIAGNOSIS — E1021 Type 1 diabetes mellitus with diabetic nephropathy: Secondary | ICD-10-CM | POA: Diagnosis not present

## 2017-12-13 DIAGNOSIS — N184 Chronic kidney disease, stage 4 (severe): Secondary | ICD-10-CM | POA: Diagnosis not present

## 2017-12-13 DIAGNOSIS — Z681 Body mass index (BMI) 19 or less, adult: Secondary | ICD-10-CM | POA: Diagnosis not present

## 2018-01-10 ENCOUNTER — Ambulatory Visit (INDEPENDENT_AMBULATORY_CARE_PROVIDER_SITE_OTHER): Payer: Medicare Other | Admitting: Gynecology

## 2018-01-10 ENCOUNTER — Encounter: Payer: Self-pay | Admitting: Gynecology

## 2018-01-10 VITALS — BP 130/70 | Ht 65.5 in | Wt 120.0 lb

## 2018-01-10 DIAGNOSIS — Z01419 Encounter for gynecological examination (general) (routine) without abnormal findings: Secondary | ICD-10-CM

## 2018-01-10 DIAGNOSIS — M8580 Other specified disorders of bone density and structure, unspecified site: Secondary | ICD-10-CM

## 2018-01-10 DIAGNOSIS — Z124 Encounter for screening for malignant neoplasm of cervix: Secondary | ICD-10-CM

## 2018-01-10 NOTE — Progress Notes (Addendum)
    Jasmine Cummings 1945-11-28 [id number]        71 y.o.  G2P2 for breast and pelvic exam.  Former patient of Dr. Toney Rakes.  Several issues noted below.  Past medical history,surgical history, problem list, medications, allergies, family history and social history were all reviewed and documented as reviewed in the EPIC chart.  ROS:  Performed with pertinent positives and negatives included in the history, assessment and plan.   Additional significant findings : None   Exam: Jasmine Cummings assistant Vitals:   01/10/18 1043  BP: 130/70  Weight: 120 lb (54.4 kg)  Height: 5' 5.5" (1.664 m)   Body mass index is 19.67 kg/m.  General appearance:  Normal affect, orientation and appearance. Skin: Grossly normal HEENT: Without gross lesions.  No cervical or supraclavicular adenopathy. Thyroid normal.  Lungs:  Clear without wheezing, rales or rhonchi Cardiac: RR without RMG Abdominal:  Soft, nontender, without masses, guarding, rebound, organomegaly or hernia Breasts:  Examined lying and sitting without masses, retractions, discharge or axillary adenopathy. Pelvic:  Ext, BUS, Vagina: With atrophic changes  Cervix: With atrophic changes.  Pap smear done  Uterus: Anteverted, normal size, shape and contour, midline and mobile nontender   Adnexa: Without masses or tenderness    Anus and perineum: Normal   Rectovaginal: Normal sphincter tone without palpated masses or tenderness.    Assessment/Plan:  72 y.o. G2P2 female for breast and pelvic exam.   1. Postmenopausal/atrophic genital changes.  No significant menopausal symptoms or any bleeding.  Continue to monitor and report any issues or bleeding. 2. Osteopenia.  Started on Prolia by Dr. Toney Rakes but discontinued due to cost.  Tried Fosamax but had GI upset and discontinued.  Had follow-up DEXA 2017 with T score -1.3.  Schedule DEXA now at 2-year interval. 3. Pap smear 2015.  Pap smear done today.  History of renal transplant.  No  history of abnormal Pap smears previously. 4. Mammography 10/2017.  Continue with annual mammography when due.  Breast exam normal today. 5. Colonoscopy 2011.  Repeat at their recommended interval. 6. Health maintenance.  No routine blood work done.  Actively followed for her diabetes as well as her renal transplant.  Follow-up for bone density.  Follow-up in 1 year for annual exam.   Jasmine Auerbach MD, 11:10 AM 01/10/2018

## 2018-01-10 NOTE — Patient Instructions (Signed)
Follow-up for bone density as scheduled.  Annual exam in 1 year.

## 2018-01-10 NOTE — Addendum Note (Signed)
Addended by: Nelva Nay on: 01/10/2018 12:40 PM   Modules accepted: Orders

## 2018-01-11 LAB — PAP IG W/ RFLX HPV ASCU

## 2018-01-23 DIAGNOSIS — Z940 Kidney transplant status: Secondary | ICD-10-CM | POA: Diagnosis not present

## 2018-01-23 DIAGNOSIS — I129 Hypertensive chronic kidney disease with stage 1 through stage 4 chronic kidney disease, or unspecified chronic kidney disease: Secondary | ICD-10-CM | POA: Diagnosis not present

## 2018-02-01 DIAGNOSIS — D696 Thrombocytopenia, unspecified: Secondary | ICD-10-CM | POA: Diagnosis not present

## 2018-02-01 DIAGNOSIS — I10 Essential (primary) hypertension: Secondary | ICD-10-CM | POA: Diagnosis not present

## 2018-02-01 DIAGNOSIS — H3520 Other non-diabetic proliferative retinopathy, unspecified eye: Secondary | ICD-10-CM | POA: Diagnosis not present

## 2018-02-01 DIAGNOSIS — E1142 Type 2 diabetes mellitus with diabetic polyneuropathy: Secondary | ICD-10-CM | POA: Diagnosis not present

## 2018-02-01 DIAGNOSIS — F3289 Other specified depressive episodes: Secondary | ICD-10-CM | POA: Diagnosis not present

## 2018-02-01 DIAGNOSIS — E7849 Other hyperlipidemia: Secondary | ICD-10-CM | POA: Diagnosis not present

## 2018-02-01 DIAGNOSIS — D6489 Other specified anemias: Secondary | ICD-10-CM | POA: Diagnosis not present

## 2018-02-01 DIAGNOSIS — E1310 Other specified diabetes mellitus with ketoacidosis without coma: Secondary | ICD-10-CM | POA: Diagnosis not present

## 2018-02-01 DIAGNOSIS — E1021 Type 1 diabetes mellitus with diabetic nephropathy: Secondary | ICD-10-CM | POA: Diagnosis not present

## 2018-02-01 DIAGNOSIS — Z681 Body mass index (BMI) 19 or less, adult: Secondary | ICD-10-CM | POA: Diagnosis not present

## 2018-02-01 DIAGNOSIS — Z794 Long term (current) use of insulin: Secondary | ICD-10-CM | POA: Diagnosis not present

## 2018-02-08 ENCOUNTER — Encounter: Payer: Self-pay | Admitting: Internal Medicine

## 2018-02-11 DIAGNOSIS — H401110 Primary open-angle glaucoma, right eye, stage unspecified: Secondary | ICD-10-CM | POA: Diagnosis not present

## 2018-02-11 DIAGNOSIS — H401132 Primary open-angle glaucoma, bilateral, moderate stage: Secondary | ICD-10-CM | POA: Diagnosis not present

## 2018-03-03 DIAGNOSIS — M8580 Other specified disorders of bone density and structure, unspecified site: Secondary | ICD-10-CM

## 2018-03-03 HISTORY — DX: Other specified disorders of bone density and structure, unspecified site: M85.80

## 2018-03-12 ENCOUNTER — Other Ambulatory Visit: Payer: Self-pay | Admitting: Gynecology

## 2018-03-12 ENCOUNTER — Ambulatory Visit (INDEPENDENT_AMBULATORY_CARE_PROVIDER_SITE_OTHER): Payer: Medicare Other

## 2018-03-12 DIAGNOSIS — M8589 Other specified disorders of bone density and structure, multiple sites: Secondary | ICD-10-CM | POA: Diagnosis not present

## 2018-03-12 DIAGNOSIS — Z780 Asymptomatic menopausal state: Secondary | ICD-10-CM

## 2018-03-12 DIAGNOSIS — M8580 Other specified disorders of bone density and structure, unspecified site: Secondary | ICD-10-CM

## 2018-03-13 ENCOUNTER — Encounter: Payer: Self-pay | Admitting: Gynecology

## 2018-03-15 ENCOUNTER — Ambulatory Visit (INDEPENDENT_AMBULATORY_CARE_PROVIDER_SITE_OTHER): Payer: Medicare Other | Admitting: Internal Medicine

## 2018-03-15 ENCOUNTER — Encounter: Payer: Self-pay | Admitting: Internal Medicine

## 2018-03-15 VITALS — BP 150/60 | HR 68 | Ht 64.5 in | Wt 121.0 lb

## 2018-03-15 DIAGNOSIS — Z794 Long term (current) use of insulin: Secondary | ICD-10-CM | POA: Diagnosis not present

## 2018-03-15 DIAGNOSIS — Z940 Kidney transplant status: Secondary | ICD-10-CM | POA: Diagnosis not present

## 2018-03-15 DIAGNOSIS — IMO0001 Reserved for inherently not codable concepts without codable children: Secondary | ICD-10-CM

## 2018-03-15 DIAGNOSIS — R1310 Dysphagia, unspecified: Secondary | ICD-10-CM | POA: Diagnosis not present

## 2018-03-15 DIAGNOSIS — E113599 Type 2 diabetes mellitus with proliferative diabetic retinopathy without macular edema, unspecified eye: Secondary | ICD-10-CM

## 2018-03-15 NOTE — Patient Instructions (Signed)

## 2018-03-15 NOTE — Progress Notes (Signed)
HISTORY OF PRESENT ILLNESS:  Jasmine Cummings is a 72 y.o. female , native of [place], with long-standing insulin requiring diabetes mellitus complicated by diabetic retinopathy and diabetic nephropathy status post kidney transplant on immunosuppressive therapy. Patient is sent today by her primary care provider, Dr. Forde Dandy, with a chief complaint of dysphagia to pills and solid food in the region of the proximal esophagus. This has been going on for several months. Occur several times per week. No associated pain. No similar issues in the past. Patient was last seen here in 2011. For a patient of Dr. Deatra Ina performed colonoscopy November 2011 to evaluate iron deficiency anemia. Examination was normal. Subsequent upper endoscopy was performed by myself and Dr. Kelby Fam absence 06/10/2010. The examination was normal. Biopsies for sprue were unremarkable. The patient has a history of GERD for which she takes omeprazole 20 mg daily. GI review of systems is otherwise remarkable for intermittent diarrhea which is stable. No bleeding or other issues. Review of outside laboratoriesshows hemoglobin 14.2. Relevant x-rays include CT scan of the abdomen and pelvis June 2015 with extensive vascular calcifications and probable cholelithiasis.Marland Kitchen  REVIEW OF SYSTEMS:  All non-GI ROS negative unless otherwise stated in the history of present illness except for exam, arthritis, visual change, itching, night sweats, ankle swelling  Past Medical History:  Diagnosis Date  . Anemia   . Anxiety   . Arthritis   . Chronic kidney disease   . Depression   . Diabetes mellitus   . Diabetic retinopathy ([place])   . GERD (gastroesophageal reflux disease)   . Hypertension   . Obsessive compulsive disorder   . Osteopenia 03/2018   T score -1.9 FRAX 9.7% / 2.1%  . Thrombocytopenia ([place]) 05/31/2015    Past Surgical History:  Procedure Laterality Date  . APPENDECTOMY    . BREAST EXCISIONAL BIOPSY Bilateral   . CESAREAN SECTION   1968  . KIDNEY TRANSPLANT Right 3/13  . RETINAL DETACHMENT SURGERY Left 2006  . TUBAL LIGATION  1981    Social History Jasmine Cummings  reports that she has never smoked. She has never used smokeless tobacco. She reports that she does not drink alcohol or use drugs.  family history includes Breast cancer (age of onset: 20) in her sister; Diabetes in her brother and brother; Heart disease in her brother and sister.  Allergies  Allergen Reactions  . Latex Hives    REACTION: swelling  . Other Other (See Comments)    [DERM]       PHYSICAL EXAMINATION: Vital signs: BP (!) 150/60 (BP Location: Left Arm, Patient Position: Sitting, Cuff Size: Normal)   Pulse 68   Ht 5' 4.5" (1.638 m) Comment: eight measured without shoes  Wt 121 lb (54.9 kg)   BMI 20.45 kg/m   Constitutional: thin,generally well-appearing, no acute distress Psychiatric: alert and oriented x3, cooperative Eyes: extraocular movements intact, anicteric, conjunctiva pink Mouth: oral pharynx moist, no lesions. No thrush Neck: supple no lymphadenopathy Cardiovascular: heart regular rate and rhythm, no murmur Lungs: clear to auscultation bilaterally Abdomen: soft, nontender, nondistended, no obvious ascites, no peritoneal signs, normal bowel sounds, no organomegaly . Fat pad left lower quadrant from insulin injections Rectal:ommitted Extremities: no clubbing, cyanosis,. Trace lower extremity edema bilaterally Skin: no lesions on visible extremities Neuro: No focal deficits. Cranial nerves intact. Normal DTRs  ASSESSMENT:  #1. Intermittent pill and solid food dysphagia. Points to the cervical esophageal region. Rule out hypertensive cricopharyngeus. Rule out stricture. Rule out infectious esophagitis #2. Multiple  medical problems. #3. Normal colonoscopy 2011 #4. Normal upper endoscopy 2011  PLAN:  #1. Upper endoscopy with possible esophageal dilation. The patient as I RISK given her multiple comorbidities,  immunocompromised state, and insulin requiring diabetes mellitus. We discussed adjusting rinse on for the procedure to avoid and wanted hypoglycemia.The nature of the procedure, as well as the risks, benefits, and alternatives were carefully and thoroughly reviewed with the patient. Ample time for discussion and questions allowed. The patient understood, was satisfied, and agreed to proceed.  A copy of this consultation note has been sent to Dr. Forde Dandy

## 2018-03-22 DIAGNOSIS — D693 Immune thrombocytopenic purpura: Secondary | ICD-10-CM | POA: Diagnosis not present

## 2018-03-22 DIAGNOSIS — N189 Chronic kidney disease, unspecified: Secondary | ICD-10-CM | POA: Diagnosis not present

## 2018-03-22 DIAGNOSIS — E119 Type 2 diabetes mellitus without complications: Secondary | ICD-10-CM | POA: Diagnosis not present

## 2018-03-22 DIAGNOSIS — D631 Anemia in chronic kidney disease: Secondary | ICD-10-CM | POA: Diagnosis not present

## 2018-03-22 DIAGNOSIS — E1122 Type 2 diabetes mellitus with diabetic chronic kidney disease: Secondary | ICD-10-CM | POA: Diagnosis not present

## 2018-03-22 DIAGNOSIS — Z940 Kidney transplant status: Secondary | ICD-10-CM | POA: Diagnosis not present

## 2018-03-22 DIAGNOSIS — I129 Hypertensive chronic kidney disease with stage 1 through stage 4 chronic kidney disease, or unspecified chronic kidney disease: Secondary | ICD-10-CM | POA: Diagnosis not present

## 2018-03-22 DIAGNOSIS — D899 Disorder involving the immune mechanism, unspecified: Secondary | ICD-10-CM | POA: Diagnosis not present

## 2018-04-01 ENCOUNTER — Ambulatory Visit (AMBULATORY_SURGERY_CENTER): Payer: Medicare Other | Admitting: Internal Medicine

## 2018-04-01 ENCOUNTER — Encounter: Payer: Self-pay | Admitting: Internal Medicine

## 2018-04-01 VITALS — BP 163/68 | HR 62 | Temp 97.7°F | Resp 10 | Ht 64.0 in | Wt 121.0 lb

## 2018-04-01 DIAGNOSIS — R1310 Dysphagia, unspecified: Secondary | ICD-10-CM | POA: Diagnosis not present

## 2018-04-01 MED ORDER — SODIUM CHLORIDE 0.9 % IV SOLN: 500.0000 mL | Freq: Once | INTRAVENOUS | Status: AC

## 2018-04-01 NOTE — Progress Notes (Signed)
A and O x3. Report to RN. Tolerated MAC anesthesia well.Teeth unchanged after procedure.

## 2018-04-01 NOTE — Patient Instructions (Signed)
YOU HAD AN ENDOSCOPIC PROCEDURE TODAY AT [facility]:   Refer to the procedure report that was given to you for any specific questions about what was found during the examination.  If the procedure report does not answer your questions, please call your gastroenterologist to clarify.  If you requested that your care partner not be given the details of your procedure findings, then the procedure report has been included in a sealed envelope for you to review at your convenience later.  YOU SHOULD EXPECT: Some feelings of bloating in the abdomen. Passage of more gas than usual.  Walking can help get rid of the air that was put into your GI tract during the procedure and reduce the bloating. If you had a lower endoscopy (such as a colonoscopy or flexible sigmoidoscopy) you may notice spotting of blood in your stool or on the toilet paper. If you underwent a bowel prep for your procedure, you may not have a normal bowel movement for a few days.  Please Note:  You might notice some irritation and congestion in your nose or some drainage.  This is from the oxygen used during your procedure.  There is no need for concern and it should clear up in a day or so.  SYMPTOMS TO REPORT IMMEDIATELY:   Following upper endoscopy (EGD)  Vomiting of blood or coffee ground material  New chest pain or pain under the shoulder blades  Painful or persistently difficult swallowing  New shortness of breath  Fever of 100F or higher  Black, tarry-looking stools  For urgent or emergent issues, a gastroenterologist can be reached at any hour by calling [phone].   DIET:  We do recommend a post esophageal dilation diet.  Drink plenty of fluids but you should avoid alcoholic beverages for 24 hours.  ACTIVITY:  You should plan to take it easy for the rest of today and you should NOT DRIVE or use heavy machinery until tomorrow (because of the sedation medicines used during the test).    FOLLOW UP: Our  staff will call the number listed on your records the next business day following your procedure to check on you and address any questions or concerns that you may have regarding the information given to you following your procedure. If we do not reach you, we will leave a message.  However, if you are feeling well and you are not experiencing any problems, there is no need to return our call.  We will assume that you have returned to your regular daily activities without incident.  If any biopsies were taken you will be contacted by phone or by letter within the next 1-3 weeks.  Please call us at [phone] if you have not heard about the biopsies in 3 weeks.    SIGNATURES/CONFIDENTIALITY: You and/or your care partner have signed paperwork which will be entered into your electronic medical record.  These signatures attest to the fact that that the information above on your After Visit Summary has been reviewed and is understood.  Full responsibility of the confidentiality of this discharge information lies with you and/or your care-partner.

## 2018-04-01 NOTE — Progress Notes (Signed)
Called to room to assist during endoscopic procedure.  Patient ID and intended procedure confirmed with present staff. Received instructions for my participation in the procedure from the performing physician.  

## 2018-04-01 NOTE — Progress Notes (Signed)
Pt's states no medical or surgical changes since previsit or office visit. 

## 2018-04-01 NOTE — Op Note (Signed)
[place] Patient Name: Andre Gallego Procedure Date: 04/01/2018 9:58 AM MRN: [id number] Endoscopist: Docia Chuck. Henrene Pastor , MD Age: 72 Referring MD:  Date of Birth: Nov 16, 1945 Gender: Female Account #: [id number] Procedure:                Upper GI endoscopy with [facility] dilation of the                            esophagus?"32 [place] Indications:              Dysphagia Medicines:                Monitored Anesthesia Care Procedure:                Pre-Anesthesia Assessment:                           - Prior to the procedure, a History and Physical                            was performed, and patient medications and                            allergies were reviewed. The patient's tolerance of                            previous anesthesia was also reviewed. The risks                            and benefits of the procedure and the sedation                            options and risks were discussed with the patient.                            All questions were answered, and informed consent                            was obtained. Prior Anticoagulants: The patient has                            taken no previous anticoagulant or antiplatelet                            agents. ASA Grade Assessment: III - A patient with                            severe systemic disease. After reviewing the risks                            and benefits, the patient was deemed in                            satisfactory condition to undergo the procedure.  After obtaining informed consent, the endoscope was                            passed under direct vision. Throughout the                            procedure, the patient's blood pressure, pulse, and                            oxygen saturations were monitored continuously. The                            Endoscope was introduced through the mouth, and                            advanced to the second part of duodenum.  The upper                            GI endoscopy was accomplished without difficulty.                            The patient tolerated the procedure well. Scope In: Scope Out: Findings:                 The esophagus was normal. The scope was withdrawn.                            Dilation was performed with a Maloney dilator with                            no resistance at 50 Fr. No resistance or heme.                           The stomach was normal, save small hiatal hernia.                           The examined duodenum was normal.                           The cardia and gastric fundus were normal on                            retroflexion. Complications:            No immediate complications. Estimated Blood Loss:     Estimated blood loss: none. Impression:               - Normal esophagus. Empirically dilated.                           - Normal stomach.                           - Normal examined duodenum.                           -  No specimens collected. Recommendation:           - Patient has a contact number available for                            emergencies. The signs and symptoms of potential                            delayed complications were discussed with the                            patient. Return to normal activities tomorrow.                            Written discharge instructions were provided to the                            patient.                           - Post dilation diet.                           - Continue present medications.                           - Return to the care of Dr. Carolan Shiver. Henrene Pastor, MD 04/01/2018 10:11:25 AM This report has been signed electronically.

## 2018-04-02 ENCOUNTER — Telehealth: Payer: Self-pay | Admitting: *Deleted

## 2018-04-02 NOTE — Telephone Encounter (Signed)
  Follow up Call-  Call back number 04/01/2018  Post procedure Call Back phone  # [phone]  Permission to leave phone message Yes  Some recent data might be hidden     Patient questions:  Do you have a fever, pain , or abdominal swelling? No. Pain Score  0 *  Have you tolerated food without any problems? Yes.    Have you been able to return to your normal activities? Yes.    Do you have any questions about your discharge instructions: Diet   No. Medications  No. Follow up visit  No.  Do you have questions or concerns about your Care? No.  Actions: * If pain score is 4 or above: No action needed, pain <4.

## 2018-04-04 DIAGNOSIS — Z23 Encounter for immunization: Secondary | ICD-10-CM | POA: Diagnosis not present

## 2018-04-08 DIAGNOSIS — H401110 Primary open-angle glaucoma, right eye, stage unspecified: Secondary | ICD-10-CM | POA: Diagnosis not present

## 2018-04-30 DIAGNOSIS — L218 Other seborrheic dermatitis: Secondary | ICD-10-CM | POA: Diagnosis not present

## 2018-04-30 DIAGNOSIS — D225 Melanocytic nevi of trunk: Secondary | ICD-10-CM | POA: Diagnosis not present

## 2018-04-30 DIAGNOSIS — D1801 Hemangioma of skin and subcutaneous tissue: Secondary | ICD-10-CM | POA: Diagnosis not present

## 2018-04-30 DIAGNOSIS — L570 Actinic keratosis: Secondary | ICD-10-CM | POA: Diagnosis not present

## 2018-05-10 DIAGNOSIS — H401110 Primary open-angle glaucoma, right eye, stage unspecified: Secondary | ICD-10-CM | POA: Diagnosis not present

## 2018-05-15 DIAGNOSIS — Z940 Kidney transplant status: Secondary | ICD-10-CM | POA: Diagnosis not present

## 2018-05-15 DIAGNOSIS — I129 Hypertensive chronic kidney disease with stage 1 through stage 4 chronic kidney disease, or unspecified chronic kidney disease: Secondary | ICD-10-CM | POA: Diagnosis not present

## 2018-06-04 DIAGNOSIS — Z940 Kidney transplant status: Secondary | ICD-10-CM | POA: Diagnosis not present

## 2018-06-04 DIAGNOSIS — N2581 Secondary hyperparathyroidism of renal origin: Secondary | ICD-10-CM | POA: Diagnosis not present

## 2018-06-04 DIAGNOSIS — E7849 Other hyperlipidemia: Secondary | ICD-10-CM | POA: Diagnosis not present

## 2018-06-04 DIAGNOSIS — Z794 Long term (current) use of insulin: Secondary | ICD-10-CM | POA: Diagnosis not present

## 2018-06-04 DIAGNOSIS — E1029 Type 1 diabetes mellitus with other diabetic kidney complication: Secondary | ICD-10-CM | POA: Diagnosis not present

## 2018-06-04 DIAGNOSIS — I6523 Occlusion and stenosis of bilateral carotid arteries: Secondary | ICD-10-CM | POA: Diagnosis not present

## 2018-06-04 DIAGNOSIS — Z681 Body mass index (BMI) 19 or less, adult: Secondary | ICD-10-CM | POA: Diagnosis not present

## 2018-06-04 DIAGNOSIS — H3520 Other non-diabetic proliferative retinopathy, unspecified eye: Secondary | ICD-10-CM | POA: Diagnosis not present

## 2018-06-04 DIAGNOSIS — D696 Thrombocytopenia, unspecified: Secondary | ICD-10-CM | POA: Diagnosis not present

## 2018-06-04 DIAGNOSIS — E1021 Type 1 diabetes mellitus with diabetic nephropathy: Secondary | ICD-10-CM | POA: Diagnosis not present

## 2018-06-04 DIAGNOSIS — E1142 Type 2 diabetes mellitus with diabetic polyneuropathy: Secondary | ICD-10-CM | POA: Diagnosis not present

## 2018-06-04 DIAGNOSIS — I10 Essential (primary) hypertension: Secondary | ICD-10-CM | POA: Diagnosis not present

## 2018-06-06 DIAGNOSIS — Z794 Long term (current) use of insulin: Secondary | ICD-10-CM | POA: Diagnosis not present

## 2018-06-06 DIAGNOSIS — N184 Chronic kidney disease, stage 4 (severe): Secondary | ICD-10-CM | POA: Diagnosis not present

## 2018-06-06 DIAGNOSIS — E1021 Type 1 diabetes mellitus with diabetic nephropathy: Secondary | ICD-10-CM | POA: Diagnosis not present

## 2018-07-24 DIAGNOSIS — Z940 Kidney transplant status: Secondary | ICD-10-CM | POA: Diagnosis not present

## 2018-07-24 DIAGNOSIS — I129 Hypertensive chronic kidney disease with stage 1 through stage 4 chronic kidney disease, or unspecified chronic kidney disease: Secondary | ICD-10-CM | POA: Diagnosis not present

## 2018-07-31 DIAGNOSIS — E1122 Type 2 diabetes mellitus with diabetic chronic kidney disease: Secondary | ICD-10-CM | POA: Diagnosis not present

## 2018-07-31 DIAGNOSIS — I129 Hypertensive chronic kidney disease with stage 1 through stage 4 chronic kidney disease, or unspecified chronic kidney disease: Secondary | ICD-10-CM | POA: Diagnosis not present

## 2018-07-31 DIAGNOSIS — Z940 Kidney transplant status: Secondary | ICD-10-CM | POA: Diagnosis not present

## 2018-07-31 DIAGNOSIS — D631 Anemia in chronic kidney disease: Secondary | ICD-10-CM | POA: Diagnosis not present

## 2018-07-31 DIAGNOSIS — N189 Chronic kidney disease, unspecified: Secondary | ICD-10-CM | POA: Diagnosis not present

## 2018-07-31 DIAGNOSIS — D693 Immune thrombocytopenic purpura: Secondary | ICD-10-CM | POA: Diagnosis not present

## 2018-07-31 DIAGNOSIS — D899 Disorder involving the immune mechanism, unspecified: Secondary | ICD-10-CM | POA: Diagnosis not present

## 2018-09-09 DIAGNOSIS — H401110 Primary open-angle glaucoma, right eye, stage unspecified: Secondary | ICD-10-CM | POA: Diagnosis not present

## 2018-09-09 DIAGNOSIS — H401112 Primary open-angle glaucoma, right eye, moderate stage: Secondary | ICD-10-CM | POA: Diagnosis not present

## 2018-10-30 ENCOUNTER — Other Ambulatory Visit: Payer: Self-pay

## 2018-10-30 ENCOUNTER — Encounter: Payer: Self-pay | Admitting: *Deleted

## 2018-10-30 ENCOUNTER — Emergency Department: Payer: Medicare Other

## 2018-10-30 ENCOUNTER — Emergency Department
Admission: EM | Admit: 2018-10-30 | Discharge: 2018-10-30 | Disposition: A | Discharge status: Home / Self Care | Payer: Medicare Other | Attending: Emergency Medicine | Admitting: Emergency Medicine

## 2018-10-30 DIAGNOSIS — E1022 Type 1 diabetes mellitus with diabetic chronic kidney disease: Secondary | ICD-10-CM | POA: Insufficient documentation

## 2018-10-30 DIAGNOSIS — R519 Headache, unspecified: Secondary | ICD-10-CM

## 2018-10-30 DIAGNOSIS — I129 Hypertensive chronic kidney disease with stage 1 through stage 4 chronic kidney disease, or unspecified chronic kidney disease: Secondary | ICD-10-CM | POA: Diagnosis not present

## 2018-10-30 DIAGNOSIS — E860 Dehydration: Secondary | ICD-10-CM | POA: Diagnosis not present

## 2018-10-30 DIAGNOSIS — R1111 Vomiting without nausea: Secondary | ICD-10-CM | POA: Diagnosis not present

## 2018-10-30 DIAGNOSIS — F419 Anxiety disorder, unspecified: Secondary | ICD-10-CM | POA: Diagnosis not present

## 2018-10-30 DIAGNOSIS — R112 Nausea with vomiting, unspecified: Secondary | ICD-10-CM

## 2018-10-30 DIAGNOSIS — Z794 Long term (current) use of insulin: Secondary | ICD-10-CM | POA: Insufficient documentation

## 2018-10-30 DIAGNOSIS — Z940 Kidney transplant status: Secondary | ICD-10-CM | POA: Insufficient documentation

## 2018-10-30 DIAGNOSIS — R197 Diarrhea, unspecified: Secondary | ICD-10-CM | POA: Diagnosis not present

## 2018-10-30 DIAGNOSIS — R110 Nausea: Secondary | ICD-10-CM | POA: Diagnosis not present

## 2018-10-30 DIAGNOSIS — R51 Headache: Secondary | ICD-10-CM | POA: Insufficient documentation

## 2018-10-30 DIAGNOSIS — G44209 Tension-type headache, unspecified, not intractable: Secondary | ICD-10-CM | POA: Diagnosis not present

## 2018-10-30 DIAGNOSIS — Z91040 Latex allergy status: Secondary | ICD-10-CM | POA: Insufficient documentation

## 2018-10-30 DIAGNOSIS — I959 Hypotension, unspecified: Secondary | ICD-10-CM | POA: Diagnosis not present

## 2018-10-30 DIAGNOSIS — Z79899 Other long term (current) drug therapy: Secondary | ICD-10-CM | POA: Diagnosis not present

## 2018-10-30 DIAGNOSIS — N184 Chronic kidney disease, stage 4 (severe): Secondary | ICD-10-CM | POA: Diagnosis not present

## 2018-10-30 DIAGNOSIS — F329 Major depressive disorder, single episode, unspecified: Secondary | ICD-10-CM | POA: Diagnosis not present

## 2018-10-30 LAB — CBC WITH DIFFERENTIAL/PLATELET
Abs Immature Granulocytes: 0.03 10*3/uL (ref 0.00–0.07)
Basophils Absolute: 0 10*3/uL (ref 0.0–0.1)
Basophils Relative: 0 %
Eosinophils Absolute: 0.2 10*3/uL (ref 0.0–0.5)
Eosinophils Relative: 2 %
HCT: 35.2 % — ABNORMAL LOW (ref 36.0–46.0)
Hemoglobin: 12 g/dL (ref 12.0–15.0)
Immature Granulocytes: 0 %
Lymphocytes Relative: 18 %
Lymphs Abs: 1.9 10*3/uL (ref 0.7–4.0)
MCH: 29.2 pg (ref 26.0–34.0)
MCHC: 34.1 g/dL (ref 30.0–36.0)
MCV: 85.6 fL (ref 80.0–100.0)
Monocytes Absolute: 0.5 10*3/uL (ref 0.1–1.0)
Monocytes Relative: 5 %
Neutro Abs: 7.4 10*3/uL (ref 1.7–7.7)
Neutrophils Relative %: 75 %
Platelets: 135 10*3/uL — ABNORMAL LOW (ref 150–400)
RBC: 4.11 MIL/uL (ref 3.87–5.11)
RDW: 12.7 % (ref 11.5–15.5)
WBC: 10.1 10*3/uL (ref 4.0–10.5)
nRBC: 0 % (ref 0.0–0.2)

## 2018-10-30 LAB — COMPREHENSIVE METABOLIC PANEL
ALT: 21 U/L (ref 0–44)
AST: 22 U/L (ref 15–41)
Albumin: 3.9 g/dL (ref 3.5–5.0)
Alkaline Phosphatase: 68 U/L (ref 38–126)
Anion gap: 13 (ref 5–15)
BUN: 21 mg/dL (ref 8–23)
CO2: 20 mmol/L — ABNORMAL LOW (ref 22–32)
Calcium: 8.7 mg/dL — ABNORMAL LOW (ref 8.9–10.3)
Chloride: 97 mmol/L — ABNORMAL LOW (ref 98–111)
Creatinine, Ser: 0.86 mg/dL (ref 0.44–1.00)
GFR calc Af Amer: >60 mL/min (ref >60)
GFR calc non Af Amer: >60 mL/min (ref >60)
Glucose, Bld: 283 mg/dL — ABNORMAL HIGH (ref 70–99)
Potassium: 4 mmol/L (ref 3.5–5.1)
Sodium: 130 mmol/L — ABNORMAL LOW (ref 135–145)
Total Bilirubin: 0.8 mg/dL (ref 0.3–1.2)
Total Protein: 6.8 g/dL (ref 6.5–8.1)

## 2018-10-30 LAB — URINALYSIS, COMPLETE (UACMP) WITH MICROSCOPIC
Bacteria, UA: NONE SEEN
Bilirubin Urine: NEGATIVE
Glucose, UA: 150 mg/dL — AB
Hgb urine dipstick: NEGATIVE
Ketones, ur: 5 mg/dL — AB
Leukocytes,Ua: NEGATIVE
Nitrite: NEGATIVE
Protein, ur: 30 mg/dL — AB
Specific Gravity, Urine: 1.011 (ref 1.005–1.030)
Squamous Epithelial / HPF: NONE SEEN (ref 0–5)
pH: 6 (ref 5.0–8.0)

## 2018-10-30 MED ORDER — SODIUM CHLORIDE 0.9 % IV BOLUS
1000.0000 mL | Freq: Once | INTRAVENOUS | Status: AC
  Administered 2018-10-30: 1000 mL via INTRAVENOUS

## 2018-10-30 MED ORDER — METOCLOPRAMIDE HCL 10 MG PO TABS: 10.0000 mg | ORAL_TABLET | Freq: Three times a day (TID) | ORAL | 0 refills | Status: DC | PRN

## 2018-10-30 MED ORDER — ACETAMINOPHEN 500 MG PO TABS
1000.0000 mg | ORAL_TABLET | Freq: Once | ORAL | Status: AC
  Administered 2018-10-30: 1000 mg via ORAL
  Filled 2018-10-30: qty 2

## 2018-10-30 MED ORDER — METOCLOPRAMIDE HCL 5 MG/ML IJ SOLN
10.0000 mg | Freq: Once | INTRAMUSCULAR | Status: AC
  Administered 2018-10-30: 22:00:00 10 mg via INTRAVENOUS
  Filled 2018-10-30: qty 2

## 2018-10-30 MED ORDER — ONDANSETRON HCL 4 MG/2ML IJ SOLN
4.0000 mg | Freq: Once | INTRAMUSCULAR | Status: AC
  Administered 2018-10-30: 4 mg via INTRAVENOUS
  Filled 2018-10-30: qty 2

## 2018-10-30 NOTE — ED Notes (Addendum)
Call to daughter she will be here to pick her up in about 20 min

## 2018-10-30 NOTE — ED Provider Notes (Signed)
[facility] Emergency Department Provider Note  ____________________________________________  Time seen: Approximately 9:09 PM  I have reviewed the triage vital signs and the nursing notes.   HISTORY  Chief Complaint Nausea and Emesis   HPI Jasmine Cummings is a 73 y.o. female with a history of renal transplant in 2012 on Myfortic and Prograf, hypertension, anemia, type 1 diabetes who presents for evaluation of nausea, vomiting and diarrhea.  Patient reports that she was laying down at 2 PM for a nap when she started having a right-sided headache that she describes as sharp, initiating behind her right eye and radiating to the entire right side of her head.  Headache was moderate in intensity.  She started feeling nauseous.  She reports having several episodes of nonbloody nonbilious emesis and 4-5 episodes of watery diarrhea after that.  She was unable to nap due to constant vomiting and diarrhea.  No abdominal pain, no fever.  She still complaining of severe nausea.  Her headaches now mild.  She denies history of similar headaches.  She denies neck stiffness.  No chest pain, cough, shortness of breath, dysuria, or hematuria.  Patient reports feeling very lonely and sad since losing her husband 3 weeks ago and not being able to see her children because of COVID-19.  She reports that she feels scared of being alone at home.  She denies depression, suicidal ideation.  Past Medical History:  Diagnosis Date  . Anemia   . Anxiety   . Arthritis   . Chronic kidney disease   . Depression   . Diabetes mellitus   . Diabetic retinopathy ([place])   . GERD (gastroesophageal reflux disease)   . Hypertension   . Obsessive compulsive disorder   . Osteopenia 03/2018   T score -1.9 FRAX 9.7% / 2.1%  . Thrombocytopenia ([place]) 05/31/2015    Patient Active Problem List   Diagnosis Date Noted  . Thrombocytopenia ([place]) 05/31/2015  . Low weight 01/15/2014  . Vaginal atrophy  10/09/2013  . Osteopenia 10/09/2013  . Chronic kidney disease (CKD), stage IV (severe) ([place]) 05/05/2011  . Diabetes mellitus 05/05/2011  . Type 1 diabetes mellitus ([place]) 04/19/2010  . Iron deficiency anemia 04/19/2010  . RENAL FAILURE, CHRONIC 04/19/2010    Past Surgical History:  Procedure Laterality Date  . APPENDECTOMY    . BREAST EXCISIONAL BIOPSY Bilateral   . CESAREAN SECTION  1968  . KIDNEY TRANSPLANT Right 3/13  . RETINAL DETACHMENT SURGERY Left 2006  . TUBAL LIGATION  1981    Prior to Admission medications   Medication Sig Start Date End Date Taking? Authorizing Provider  amLODipine (NORVASC) 10 MG tablet Take 10 mg by mouth at bedtime.    [provider]  ezetimibe-simvastatin (VYTORIN) 10-20 MG per tablet Take 1 tablet by mouth at bedtime. [facility] Mon, Wed, Fri at [facility]    [provider]  FLUoxetine (PROZAC) 40 MG capsule Take 40 mg by mouth every morning.     [provider]  insulin aspart (NOVOLOG) 100 UNIT/ML injection Inject 12 Units into the skin 4 (four) times daily. Sliding Scale    [provider]  Insulin Glargine (LANTUS [place]) Inject 9 Units into the skin daily. 9 units in AM    [provider]  metoCLOPramide (REGLAN) 10 MG tablet Take 1 tablet (10 mg total) by mouth every 8 (eight) hours as needed for up to 3 days for nausea. 10/30/18 11/02/18  Rudene Re, MD  metoprolol tartrate (LOPRESSOR) 25  MG tablet Take 50 mg by mouth 2 (two) times daily.  01/30/13   [provider]  Multiple Vitamin (MULTIVITAMIN) capsule Take 1 capsule by mouth daily.      [provider]  mycophenolate (MYFORTIC) 180 MG EC tablet Take 180-360 mg by mouth 2 (two) times daily. Takes 2 in the morning and 1 at night    [provider]  NOVOFINE 32G X 6 MM MISC as needed. Reported on 01/06/2016 03/11/13   [provider]  omeprazole (PRILOSEC) 20 MG capsule Take 20 mg by mouth daily. 01/16/13   [provider]   simvastatin (ZOCOR) 40 MG tablet Take 40 mg by mouth daily.    [provider]  tacrolimus (PROGRAF) 1 MG capsule Take 1-2 mg by mouth 2 (two) times daily. Takes 2 in the morning and 1 at night    [provider]  zinc gluconate 50 MG tablet Take 50 mg by mouth daily.    [provider]    Allergies Latex and Other  Family History  Problem Relation Age of Onset  . Heart disease Brother   . Diabetes Brother   . Diabetes Brother   . Heart disease Sister   . Breast cancer Sister 7    Social History Social History   Tobacco Use  . Smoking status: Never Smoker  . Smokeless tobacco: Never Used  . Tobacco comment: never used tobacco  Substance Use Topics  . Alcohol use: No    Alcohol/week: 0.0 standard drinks  . Drug use: No    Review of Systems  Constitutional: Negative for fever. Eyes: Negative for visual changes. ENT: Negative for sore throat. Neck: No neck pain  Cardiovascular: Negative for chest pain. Respiratory: Negative for shortness of breath. Gastrointestinal: Negative for abdominal pain. + nausea, vomiting and diarrhea. Genitourinary: Negative for dysuria. Musculoskeletal: Negative for back pain. Skin: Negative for rash. Neurological: Negative for  weakness or numbness. + HA Psych: No SI or HI  ____________________________________________   PHYSICAL EXAM:  VITAL SIGNS: ED Triage Vitals  Enc Vitals Group     BP 10/30/18 2048 (!) 166/56     Pulse Rate 10/30/18 2048 67     Resp 10/30/18 2048 14     Temp 10/30/18 2048 99 F (37.2 C)     Temp Source 10/30/18 2048 Oral     SpO2 10/30/18 2048 98 %     Weight 10/30/18 2050 125 lb (56.7 kg)     Height 10/30/18 2050 5\' 5"  (1.651 m)     Head Circumference --      Peak Flow --      Pain Score 10/30/18 2050 2     Pain Loc --      Pain Edu? --      Excl. in [place]? --     Constitutional: Alert and oriented. Well appearing and in no apparent distress. HEENT:      Head:  Normocephalic and atraumatic.         Eyes: Conjunctivae are normal. Sclera is non-icteric.  Pupils are 2 mm and reactive bilaterally, intact extraocular movements      Mouth/Throat: Mucous membranes are moist.       Neck: Supple with no signs of meningismus. Cardiovascular: Regular rate and rhythm. No murmurs, gallops, or rubs. 2+ symmetrical distal pulses are present in all extremities. No JVD. Respiratory: Normal respiratory effort. Lungs are clear to auscultation bilaterally. No wheezes, crackles, or rhonchi.  Gastrointestinal: Soft, non tender, and non  distended with positive bowel sounds. No rebound or guarding. Genitourinary: No CVA tenderness. Musculoskeletal: Nontender with normal range of motion in all extremities. No edema, cyanosis, or erythema of extremities. Neurologic: Normal speech and language. Face is symmetric. Moving all extremities.  Intact strength and sensation x4 Skin: Skin is warm, dry and intact. No rash noted. Psychiatric: Mood and affect are normal. Speech and behavior are normal.  ____________________________________________   LABS (all labs ordered are listed, but only abnormal results are displayed)  Labs Reviewed  CBC WITH DIFFERENTIAL/PLATELET - Abnormal; Notable for the following components:      Result Value   HCT 35.2 (*)    Platelets 135 (*)    All other components within normal limits  URINALYSIS, COMPLETE (UACMP) WITH MICROSCOPIC - Abnormal; Notable for the following components:   Color, Urine YELLOW (*)    APPearance CLEAR (*)    Glucose, UA 150 (*)    Ketones, ur 5 (*)    Protein, ur 30 (*)    All other components within normal limits  COMPREHENSIVE METABOLIC PANEL - Abnormal; Notable for the following components:   Sodium 130 (*)    Chloride 97 (*)    CO2 20 (*)    Glucose, Bld 283 (*)    Calcium 8.7 (*)    All other components within normal limits  CBG MONITORING, ED   ____________________________________________  EKG  ED ECG  REPORT I, Rudene Re, the attending physician, personally viewed and interpreted this ECG.  Normal sinus rhythm, rate of 70, normal intervals, left axis deviation, anterior Q waves, no ST elevations or depressions.  Unchanged from prior. ____________________________________________  RADIOLOGY  I have personally reviewed the images performed during this visit and I agree with the Radiologist's read.   Interpretation by Radiologist:  Ct Head Wo Contrast  Result Date: 10/30/2018 CLINICAL DATA:  73 year old female with headache EXAM: CT HEAD WITHOUT CONTRAST TECHNIQUE: Contiguous axial images were obtained from the base of the skull through the vertex without intravenous contrast. COMPARISON:  None. FINDINGS: Brain: No acute intracranial hemorrhage. No midline shift or mass effect. Gray-white differentiation maintained. Mild patchy hypodensity in the white matter overlying the bilateral frontal horns. Unremarkable appearance of the ventricular system. Vascular: Intracranial atherosclerosis Skull: No acute fracture.  No aggressive bone lesion identified. Sinuses/Orbits: Surgical changes of left orbit. Unremarkable paranasal sinuses Other: None IMPRESSION: Negative for acute intracranial abnormality. Mild chronic microvascular ischemic disease Electronically Signed   By: Corrie Mckusick D.O.   On: 10/30/2018 21:17      ____________________________________________   PROCEDURES  Procedure(s) performed: None Procedures Critical Care performed:  None ____________________________________________   INITIAL IMPRESSION / ASSESSMENT AND PLAN / ED COURSE   73 y.o. female with a history of renal transplant in 2012 on Myfortic and Prograf, hypertension, anemia, type 1 diabetes who presents for evaluation of HA, nausea, vomiting and diarrhea.  Patient is well-appearing, normal vital signs, neurologically intact, abdomen is soft with no tenderness throughout.  No thunderclap headache, no trauma,  patient is not on blood thinners.  No signs of stroke.  No signs of meningitis.  Will check labs to rule out dehydration, acute kidney injury, electrolyte abnormalities in the setting of most likely gastroenteritis with nausea, vomiting and diarrhea.  Will give IV fluids and Zofran.  Will give Tylenol for the headache.  Will check a head CT.  Clinical Course as of Oct 29 2241  Wed Oct 30, 2018  2242 Labs showing mild hyponatremia in the setting of  mild dehydration.  Patient with slightly elevated glucose but no evidence of DKA.  UA negative for UTI.  Nausea is now well controlled, headache has resolved.  Head CT negative for any intracranial findings.  Patient is tolerating p.o. with no further episodes of vomiting.  Will discharge home with close follow-up with her transplant team.  Discussed return precautions with patient.   [CV]    Clinical Course User Index [CV] Alfred Levins [place], MD     As part of my medical decision making, I reviewed the following data within the [place] notes reviewed and incorporated, Labs reviewed , EKG interpreted , Old EKG reviewed, Old chart reviewed, Radiograph reviewed , Notes from prior ED visits and [place] Controlled Substance Database    Pertinent labs & imaging results that were available during my care of the patient were reviewed by me and considered in my medical decision making (see chart for details).    ____________________________________________   FINAL CLINICAL IMPRESSION(S) / ED DIAGNOSES  Final diagnoses:  Nausea vomiting and diarrhea  Dehydration  Acute nonintractable headache, unspecified headache type      NEW MEDICATIONS STARTED DURING THIS VISIT:  ED Discharge Orders         Ordered    metoCLOPramide (REGLAN) 10 MG tablet  Every 8 hours PRN     10/30/18 2243           Note:  This document was prepared using Dragon voice recognition software and may include unintentional dictation errors.     Alfred Levins, [place], MD 10/30/18 [phone]

## 2018-10-30 NOTE — ED Notes (Signed)
Daughter Jasmine Cummings [phone]

## 2018-10-30 NOTE — ED Triage Notes (Signed)
Per EMS pt with N/V ince 3pm. Pt states it started at 3pm with a headache. Pt has a hx of kidney transplant about 8 years ago. Hx DM

## 2018-10-30 NOTE — ED Triage Notes (Signed)
Pt states her husband died here at [facility] 3 weeks ago

## 2018-11-08 DIAGNOSIS — E1142 Type 2 diabetes mellitus with diabetic polyneuropathy: Secondary | ICD-10-CM | POA: Diagnosis not present

## 2018-11-08 DIAGNOSIS — H3520 Other non-diabetic proliferative retinopathy, unspecified eye: Secondary | ICD-10-CM | POA: Diagnosis not present

## 2018-11-08 DIAGNOSIS — K222 Esophageal obstruction: Secondary | ICD-10-CM | POA: Diagnosis not present

## 2018-11-08 DIAGNOSIS — E871 Hypo-osmolality and hyponatremia: Secondary | ICD-10-CM | POA: Diagnosis not present

## 2018-11-08 DIAGNOSIS — E1021 Type 1 diabetes mellitus with diabetic nephropathy: Secondary | ICD-10-CM | POA: Diagnosis not present

## 2018-11-08 DIAGNOSIS — D696 Thrombocytopenia, unspecified: Secondary | ICD-10-CM | POA: Diagnosis not present

## 2018-11-08 DIAGNOSIS — Z794 Long term (current) use of insulin: Secondary | ICD-10-CM | POA: Diagnosis not present

## 2018-11-08 DIAGNOSIS — R112 Nausea with vomiting, unspecified: Secondary | ICD-10-CM | POA: Diagnosis not present

## 2018-11-08 DIAGNOSIS — R51 Headache: Secondary | ICD-10-CM | POA: Diagnosis not present

## 2018-11-08 DIAGNOSIS — F329 Major depressive disorder, single episode, unspecified: Secondary | ICD-10-CM | POA: Diagnosis not present

## 2018-11-08 DIAGNOSIS — Z940 Kidney transplant status: Secondary | ICD-10-CM | POA: Diagnosis not present

## 2018-11-08 DIAGNOSIS — I10 Essential (primary) hypertension: Secondary | ICD-10-CM | POA: Diagnosis not present

## 2018-11-26 ENCOUNTER — Other Ambulatory Visit: Payer: Self-pay | Admitting: Gynecology

## 2018-11-26 DIAGNOSIS — Z1231 Encounter for screening mammogram for malignant neoplasm of breast: Secondary | ICD-10-CM

## 2018-12-16 DIAGNOSIS — K222 Esophageal obstruction: Secondary | ICD-10-CM | POA: Diagnosis not present

## 2018-12-16 DIAGNOSIS — H409 Unspecified glaucoma: Secondary | ICD-10-CM | POA: Diagnosis not present

## 2018-12-16 DIAGNOSIS — Z794 Long term (current) use of insulin: Secondary | ICD-10-CM | POA: Diagnosis not present

## 2018-12-16 DIAGNOSIS — H3520 Other non-diabetic proliferative retinopathy, unspecified eye: Secondary | ICD-10-CM | POA: Diagnosis not present

## 2018-12-16 DIAGNOSIS — N2581 Secondary hyperparathyroidism of renal origin: Secondary | ICD-10-CM | POA: Diagnosis not present

## 2018-12-16 DIAGNOSIS — Z1331 Encounter for screening for depression: Secondary | ICD-10-CM | POA: Diagnosis not present

## 2018-12-16 DIAGNOSIS — Z940 Kidney transplant status: Secondary | ICD-10-CM | POA: Diagnosis not present

## 2018-12-16 DIAGNOSIS — I10 Essential (primary) hypertension: Secondary | ICD-10-CM | POA: Diagnosis not present

## 2018-12-16 DIAGNOSIS — E785 Hyperlipidemia, unspecified: Secondary | ICD-10-CM | POA: Diagnosis not present

## 2018-12-16 DIAGNOSIS — D696 Thrombocytopenia, unspecified: Secondary | ICD-10-CM | POA: Diagnosis not present

## 2018-12-16 DIAGNOSIS — E1021 Type 1 diabetes mellitus with diabetic nephropathy: Secondary | ICD-10-CM | POA: Diagnosis not present

## 2018-12-16 DIAGNOSIS — D8989 Other specified disorders involving the immune mechanism, not elsewhere classified: Secondary | ICD-10-CM | POA: Diagnosis not present

## 2018-12-16 DIAGNOSIS — E871 Hypo-osmolality and hyponatremia: Secondary | ICD-10-CM | POA: Diagnosis not present

## 2019-01-13 ENCOUNTER — Ambulatory Visit: Payer: Medicare Other

## 2019-01-16 ENCOUNTER — Encounter: Payer: Medicare Other | Admitting: Gynecology

## 2019-01-21 ENCOUNTER — Other Ambulatory Visit: Payer: Self-pay

## 2019-01-21 ENCOUNTER — Encounter: Payer: Self-pay | Admitting: Gynecology

## 2019-01-21 ENCOUNTER — Ambulatory Visit (INDEPENDENT_AMBULATORY_CARE_PROVIDER_SITE_OTHER): Payer: Medicare Other | Admitting: Gynecology

## 2019-01-21 VITALS — BP 122/78 | Ht 65.0 in | Wt 123.0 lb

## 2019-01-21 DIAGNOSIS — Z01419 Encounter for gynecological examination (general) (routine) without abnormal findings: Secondary | ICD-10-CM

## 2019-01-21 DIAGNOSIS — M8580 Other specified disorders of bone density and structure, unspecified site: Secondary | ICD-10-CM

## 2019-01-21 DIAGNOSIS — N952 Postmenopausal atrophic vaginitis: Secondary | ICD-10-CM

## 2019-01-21 NOTE — Progress Notes (Signed)
    Jasmine Cummings 1946/04/06 [id number]        72 y.o.  G2P2 for breast and pelvic exam.  Without gynecologic complaints recently lost her husband.  Past medical history,surgical history, problem list, medications, allergies, family history and social history were all reviewed and documented as reviewed in the EPIC chart.  ROS:  Performed with pertinent positives and negatives included in the history, assessment and plan.   Additional significant findings : None   Exam: [name] assistant Vitals:   01/21/19 1002  BP: 122/78  Weight: 123 lb (55.8 kg)  Height: 5\' 5"  (1.651 m)   Body mass index is 20.47 kg/m.  General appearance:  Normal affect, orientation and appearance. Skin: Grossly normal HEENT: Without gross lesions.  No cervical or supraclavicular adenopathy. Thyroid normal.  Lungs:  Clear without wheezing, rales or rhonchi Cardiac: RR, 3/6 murmur Abdominal:  Soft, nontender, without masses, guarding, rebound, organomegaly or hernia Breasts:  Examined lying and sitting without masses, retractions, discharge or axillary adenopathy. Pelvic:  Ext, BUS, Vagina: With atrophic changes  Cervix: With atrophic changes  Uterus: Anteverted, normal size, shape and contour, midline and mobile nontender   Adnexa: Without masses or tenderness    Anus and perineum: Normal   Rectovaginal: Normal sphincter tone without palpated masses or tenderness.    Assessment/Plan:  73 y.o. G2P2 female for breast and pelvic exam  1. Postmenopausal.  No significant menopausal symptoms or any vaginal bleeding. 2. Osteopenia.  DEXA 2019 T score -1.9 FRAX 9.7% / 2.1%.  Plan on repeat DEXA next year at 2-year interval.  Had been on Prolia transiently by Dr. Toney Rakes but discontinued due to cost did not tolerate.  Fosamax due to GI upset. 3. Mammography scheduled and she will follow-up for this.  Breast exam normal today. 4. Pap smear 2019.  No Pap smear done today.  Plan repeat Pap smear next year  due to renal transplant history no history of abnormal Pap smears previously. 5. Colonoscopy 2019.  Repeat at their recommended interval. 6. Health maintenance.  No routine lab work done as patient does this elsewhere.  Follow-up 1 year, sooner as needed.   Anastasio Auerbach MD, 10:26 AM 01/21/2019

## 2019-01-21 NOTE — Patient Instructions (Signed)
Follow-up in 1 year for annual exam, sooner if any issues. 

## 2019-01-24 DIAGNOSIS — Z940 Kidney transplant status: Secondary | ICD-10-CM | POA: Diagnosis not present

## 2019-01-24 DIAGNOSIS — I129 Hypertensive chronic kidney disease with stage 1 through stage 4 chronic kidney disease, or unspecified chronic kidney disease: Secondary | ICD-10-CM | POA: Diagnosis not present

## 2019-01-28 DIAGNOSIS — Z794 Long term (current) use of insulin: Secondary | ICD-10-CM | POA: Diagnosis not present

## 2019-01-28 DIAGNOSIS — Z940 Kidney transplant status: Secondary | ICD-10-CM | POA: Diagnosis not present

## 2019-01-28 DIAGNOSIS — E1021 Type 1 diabetes mellitus with diabetic nephropathy: Secondary | ICD-10-CM | POA: Diagnosis not present

## 2019-01-28 DIAGNOSIS — N184 Chronic kidney disease, stage 4 (severe): Secondary | ICD-10-CM | POA: Diagnosis not present

## 2019-01-28 DIAGNOSIS — I129 Hypertensive chronic kidney disease with stage 1 through stage 4 chronic kidney disease, or unspecified chronic kidney disease: Secondary | ICD-10-CM | POA: Diagnosis not present

## 2019-01-28 DIAGNOSIS — I10 Essential (primary) hypertension: Secondary | ICD-10-CM | POA: Diagnosis not present

## 2019-02-24 ENCOUNTER — Ambulatory Visit
Admission: RE | Admit: 2019-02-24 | Discharge: 2019-02-24 | Disposition: A | Discharge status: Home / Self Care | Payer: Medicare Other | Source: Ambulatory Visit | Attending: Gynecology | Admitting: Gynecology

## 2019-02-24 ENCOUNTER — Other Ambulatory Visit: Payer: Self-pay

## 2019-02-24 DIAGNOSIS — Z1231 Encounter for screening mammogram for malignant neoplasm of breast: Secondary | ICD-10-CM | POA: Diagnosis not present

## 2019-02-26 ENCOUNTER — Other Ambulatory Visit: Payer: Self-pay | Admitting: Gynecology

## 2019-02-26 DIAGNOSIS — R928 Other abnormal and inconclusive findings on diagnostic imaging of breast: Secondary | ICD-10-CM

## 2019-03-05 ENCOUNTER — Other Ambulatory Visit: Payer: Self-pay

## 2019-03-05 ENCOUNTER — Ambulatory Visit
Admission: RE | Admit: 2019-03-05 | Discharge: 2019-03-05 | Disposition: A | Discharge status: Home / Self Care | Payer: Medicare Other | Source: Ambulatory Visit | Attending: Gynecology | Admitting: Gynecology

## 2019-03-05 DIAGNOSIS — R928 Other abnormal and inconclusive findings on diagnostic imaging of breast: Secondary | ICD-10-CM

## 2019-03-05 DIAGNOSIS — N6312 Unspecified lump in the right breast, upper inner quadrant: Secondary | ICD-10-CM | POA: Diagnosis not present

## 2019-03-05 DIAGNOSIS — N6311 Unspecified lump in the right breast, upper outer quadrant: Secondary | ICD-10-CM | POA: Diagnosis not present

## 2019-03-05 DIAGNOSIS — R922 Inconclusive mammogram: Secondary | ICD-10-CM | POA: Diagnosis not present

## 2019-03-11 DIAGNOSIS — H401110 Primary open-angle glaucoma, right eye, stage unspecified: Secondary | ICD-10-CM | POA: Diagnosis not present

## 2019-03-11 DIAGNOSIS — H401112 Primary open-angle glaucoma, right eye, moderate stage: Secondary | ICD-10-CM | POA: Diagnosis not present

## 2019-03-18 DIAGNOSIS — H401110 Primary open-angle glaucoma, right eye, stage unspecified: Secondary | ICD-10-CM | POA: Diagnosis not present

## 2019-04-03 DIAGNOSIS — Z940 Kidney transplant status: Secondary | ICD-10-CM | POA: Diagnosis not present

## 2019-04-03 DIAGNOSIS — I129 Hypertensive chronic kidney disease with stage 1 through stage 4 chronic kidney disease, or unspecified chronic kidney disease: Secondary | ICD-10-CM | POA: Diagnosis not present

## 2019-04-09 ENCOUNTER — Encounter: Payer: Self-pay | Admitting: Gynecology

## 2019-04-14 DIAGNOSIS — E109 Type 1 diabetes mellitus without complications: Secondary | ICD-10-CM | POA: Diagnosis not present

## 2019-04-14 DIAGNOSIS — E7849 Other hyperlipidemia: Secondary | ICD-10-CM | POA: Diagnosis not present

## 2019-04-14 DIAGNOSIS — E1021 Type 1 diabetes mellitus with diabetic nephropathy: Secondary | ICD-10-CM | POA: Diagnosis not present

## 2019-04-14 DIAGNOSIS — R7989 Other specified abnormal findings of blood chemistry: Secondary | ICD-10-CM | POA: Diagnosis not present

## 2019-04-14 DIAGNOSIS — M859 Disorder of bone density and structure, unspecified: Secondary | ICD-10-CM | POA: Diagnosis not present

## 2019-04-17 DIAGNOSIS — Z1339 Encounter for screening examination for other mental health and behavioral disorders: Secondary | ICD-10-CM | POA: Diagnosis not present

## 2019-04-17 DIAGNOSIS — I700 Atherosclerosis of aorta: Secondary | ICD-10-CM | POA: Diagnosis not present

## 2019-04-17 DIAGNOSIS — I10 Essential (primary) hypertension: Secondary | ICD-10-CM | POA: Diagnosis not present

## 2019-04-17 DIAGNOSIS — Z940 Kidney transplant status: Secondary | ICD-10-CM | POA: Diagnosis not present

## 2019-04-17 DIAGNOSIS — Z23 Encounter for immunization: Secondary | ICD-10-CM | POA: Diagnosis not present

## 2019-04-17 DIAGNOSIS — E1029 Type 1 diabetes mellitus with other diabetic kidney complication: Secondary | ICD-10-CM | POA: Diagnosis not present

## 2019-04-17 DIAGNOSIS — Z0000 Encounter for general adult medical examination without abnormal findings: Secondary | ICD-10-CM | POA: Diagnosis not present

## 2019-04-17 DIAGNOSIS — R0609 Other forms of dyspnea: Secondary | ICD-10-CM | POA: Diagnosis not present

## 2019-04-17 DIAGNOSIS — K8020 Calculus of gallbladder without cholecystitis without obstruction: Secondary | ICD-10-CM | POA: Diagnosis not present

## 2019-04-17 DIAGNOSIS — K222 Esophageal obstruction: Secondary | ICD-10-CM | POA: Diagnosis not present

## 2019-04-17 DIAGNOSIS — I679 Cerebrovascular disease, unspecified: Secondary | ICD-10-CM | POA: Diagnosis not present

## 2019-04-17 DIAGNOSIS — E871 Hypo-osmolality and hyponatremia: Secondary | ICD-10-CM | POA: Diagnosis not present

## 2019-04-17 DIAGNOSIS — R82998 Other abnormal findings in urine: Secondary | ICD-10-CM | POA: Diagnosis not present

## 2019-04-17 DIAGNOSIS — H3520 Other non-diabetic proliferative retinopathy, unspecified eye: Secondary | ICD-10-CM | POA: Diagnosis not present

## 2019-04-18 ENCOUNTER — Other Ambulatory Visit (HOSPITAL_COMMUNITY): Payer: Self-pay | Admitting: Endocrinology

## 2019-04-18 DIAGNOSIS — R0609 Other forms of dyspnea: Secondary | ICD-10-CM

## 2019-04-18 DIAGNOSIS — I700 Atherosclerosis of aorta: Secondary | ICD-10-CM

## 2019-04-18 DIAGNOSIS — Z940 Kidney transplant status: Secondary | ICD-10-CM

## 2019-04-23 ENCOUNTER — Other Ambulatory Visit: Payer: Self-pay

## 2019-04-23 ENCOUNTER — Ambulatory Visit (HOSPITAL_COMMUNITY): Payer: Medicare Other | Attending: Cardiology

## 2019-04-23 DIAGNOSIS — Z940 Kidney transplant status: Secondary | ICD-10-CM | POA: Diagnosis not present

## 2019-04-23 DIAGNOSIS — I700 Atherosclerosis of aorta: Secondary | ICD-10-CM | POA: Insufficient documentation

## 2019-04-23 DIAGNOSIS — R0609 Other forms of dyspnea: Secondary | ICD-10-CM

## 2019-04-23 DIAGNOSIS — R0600 Dyspnea, unspecified: Secondary | ICD-10-CM

## 2019-04-29 DIAGNOSIS — N184 Chronic kidney disease, stage 4 (severe): Secondary | ICD-10-CM | POA: Diagnosis not present

## 2019-04-29 DIAGNOSIS — I10 Essential (primary) hypertension: Secondary | ICD-10-CM | POA: Diagnosis not present

## 2019-04-29 DIAGNOSIS — E1021 Type 1 diabetes mellitus with diabetic nephropathy: Secondary | ICD-10-CM | POA: Diagnosis not present

## 2019-04-29 DIAGNOSIS — Z794 Long term (current) use of insulin: Secondary | ICD-10-CM | POA: Diagnosis not present

## 2019-05-07 DIAGNOSIS — M79641 Pain in right hand: Secondary | ICD-10-CM | POA: Diagnosis not present

## 2019-06-03 DIAGNOSIS — F329 Major depressive disorder, single episode, unspecified: Secondary | ICD-10-CM | POA: Diagnosis not present

## 2019-06-03 DIAGNOSIS — F4320 Adjustment disorder, unspecified: Secondary | ICD-10-CM | POA: Diagnosis not present

## 2019-07-08 DIAGNOSIS — Z940 Kidney transplant status: Secondary | ICD-10-CM | POA: Diagnosis not present

## 2019-08-05 DIAGNOSIS — F4320 Adjustment disorder, unspecified: Secondary | ICD-10-CM | POA: Diagnosis not present

## 2019-08-12 DIAGNOSIS — H3520 Other non-diabetic proliferative retinopathy, unspecified eye: Secondary | ICD-10-CM | POA: Diagnosis not present

## 2019-08-12 DIAGNOSIS — I6523 Occlusion and stenosis of bilateral carotid arteries: Secondary | ICD-10-CM | POA: Diagnosis not present

## 2019-08-12 DIAGNOSIS — K222 Esophageal obstruction: Secondary | ICD-10-CM | POA: Diagnosis not present

## 2019-08-12 DIAGNOSIS — I679 Cerebrovascular disease, unspecified: Secondary | ICD-10-CM | POA: Diagnosis not present

## 2019-08-12 DIAGNOSIS — E1029 Type 1 diabetes mellitus with other diabetic kidney complication: Secondary | ICD-10-CM | POA: Diagnosis not present

## 2019-08-12 DIAGNOSIS — K8020 Calculus of gallbladder without cholecystitis without obstruction: Secondary | ICD-10-CM | POA: Diagnosis not present

## 2019-08-12 DIAGNOSIS — E871 Hypo-osmolality and hyponatremia: Secondary | ICD-10-CM | POA: Diagnosis not present

## 2019-08-12 DIAGNOSIS — M8580 Other specified disorders of bone density and structure, unspecified site: Secondary | ICD-10-CM | POA: Diagnosis not present

## 2019-08-12 DIAGNOSIS — I480 Paroxysmal atrial fibrillation: Secondary | ICD-10-CM | POA: Diagnosis not present

## 2019-08-12 DIAGNOSIS — H409 Unspecified glaucoma: Secondary | ICD-10-CM | POA: Diagnosis not present

## 2019-08-12 DIAGNOSIS — Z1331 Encounter for screening for depression: Secondary | ICD-10-CM | POA: Diagnosis not present

## 2019-08-12 DIAGNOSIS — Z940 Kidney transplant status: Secondary | ICD-10-CM | POA: Diagnosis not present

## 2019-08-12 DIAGNOSIS — Z794 Long term (current) use of insulin: Secondary | ICD-10-CM | POA: Diagnosis not present

## 2019-08-19 DIAGNOSIS — Z794 Long term (current) use of insulin: Secondary | ICD-10-CM | POA: Diagnosis not present

## 2019-08-19 DIAGNOSIS — N1831 Chronic kidney disease, stage 3a: Secondary | ICD-10-CM | POA: Diagnosis not present

## 2019-08-19 DIAGNOSIS — I129 Hypertensive chronic kidney disease with stage 1 through stage 4 chronic kidney disease, or unspecified chronic kidney disease: Secondary | ICD-10-CM | POA: Diagnosis not present

## 2019-08-19 DIAGNOSIS — E1021 Type 1 diabetes mellitus with diabetic nephropathy: Secondary | ICD-10-CM | POA: Diagnosis not present

## 2019-09-02 DIAGNOSIS — N189 Chronic kidney disease, unspecified: Secondary | ICD-10-CM | POA: Diagnosis not present

## 2019-09-02 DIAGNOSIS — I129 Hypertensive chronic kidney disease with stage 1 through stage 4 chronic kidney disease, or unspecified chronic kidney disease: Secondary | ICD-10-CM | POA: Diagnosis not present

## 2019-09-02 DIAGNOSIS — Z940 Kidney transplant status: Secondary | ICD-10-CM | POA: Diagnosis not present

## 2019-09-02 DIAGNOSIS — D849 Immunodeficiency, unspecified: Secondary | ICD-10-CM | POA: Diagnosis not present

## 2019-09-02 DIAGNOSIS — E1122 Type 2 diabetes mellitus with diabetic chronic kidney disease: Secondary | ICD-10-CM | POA: Diagnosis not present

## 2019-09-02 DIAGNOSIS — D693 Immune thrombocytopenic purpura: Secondary | ICD-10-CM | POA: Diagnosis not present

## 2019-09-02 DIAGNOSIS — D631 Anemia in chronic kidney disease: Secondary | ICD-10-CM | POA: Diagnosis not present

## 2019-09-03 DIAGNOSIS — F329 Major depressive disorder, single episode, unspecified: Secondary | ICD-10-CM | POA: Diagnosis not present

## 2019-09-15 DIAGNOSIS — E103591 Type 1 diabetes mellitus with proliferative diabetic retinopathy without macular edema, right eye: Secondary | ICD-10-CM | POA: Diagnosis not present

## 2019-10-06 DIAGNOSIS — W1830XA Fall on same level, unspecified, initial encounter: Secondary | ICD-10-CM | POA: Diagnosis not present

## 2019-10-06 DIAGNOSIS — N1831 Chronic kidney disease, stage 3a: Secondary | ICD-10-CM | POA: Diagnosis not present

## 2019-10-06 DIAGNOSIS — M25561 Pain in right knee: Secondary | ICD-10-CM | POA: Diagnosis not present

## 2019-10-06 DIAGNOSIS — Z940 Kidney transplant status: Secondary | ICD-10-CM | POA: Diagnosis not present

## 2019-10-06 DIAGNOSIS — M545 Low back pain: Secondary | ICD-10-CM | POA: Diagnosis not present

## 2019-10-28 DIAGNOSIS — F329 Major depressive disorder, single episode, unspecified: Secondary | ICD-10-CM | POA: Diagnosis not present

## 2019-11-18 DIAGNOSIS — E1021 Type 1 diabetes mellitus with diabetic nephropathy: Secondary | ICD-10-CM | POA: Diagnosis not present

## 2019-11-18 DIAGNOSIS — N1831 Chronic kidney disease, stage 3a: Secondary | ICD-10-CM | POA: Diagnosis not present

## 2019-11-18 DIAGNOSIS — I129 Hypertensive chronic kidney disease with stage 1 through stage 4 chronic kidney disease, or unspecified chronic kidney disease: Secondary | ICD-10-CM | POA: Diagnosis not present

## 2019-11-18 DIAGNOSIS — E109 Type 1 diabetes mellitus without complications: Secondary | ICD-10-CM | POA: Diagnosis not present

## 2019-11-18 DIAGNOSIS — Z794 Long term (current) use of insulin: Secondary | ICD-10-CM | POA: Diagnosis not present

## 2019-12-16 DIAGNOSIS — H3520 Other non-diabetic proliferative retinopathy, unspecified eye: Secondary | ICD-10-CM | POA: Diagnosis not present

## 2019-12-16 DIAGNOSIS — I5189 Other ill-defined heart diseases: Secondary | ICD-10-CM | POA: Diagnosis not present

## 2019-12-16 DIAGNOSIS — I359 Nonrheumatic aortic valve disorder, unspecified: Secondary | ICD-10-CM | POA: Diagnosis not present

## 2019-12-16 DIAGNOSIS — K8020 Calculus of gallbladder without cholecystitis without obstruction: Secondary | ICD-10-CM | POA: Diagnosis not present

## 2019-12-16 DIAGNOSIS — Z1331 Encounter for screening for depression: Secondary | ICD-10-CM | POA: Diagnosis not present

## 2019-12-16 DIAGNOSIS — I700 Atherosclerosis of aorta: Secondary | ICD-10-CM | POA: Diagnosis not present

## 2019-12-16 DIAGNOSIS — I480 Paroxysmal atrial fibrillation: Secondary | ICD-10-CM | POA: Diagnosis not present

## 2019-12-16 DIAGNOSIS — F4320 Adjustment disorder, unspecified: Secondary | ICD-10-CM | POA: Diagnosis not present

## 2019-12-16 DIAGNOSIS — E871 Hypo-osmolality and hyponatremia: Secondary | ICD-10-CM | POA: Diagnosis not present

## 2019-12-16 DIAGNOSIS — K222 Esophageal obstruction: Secondary | ICD-10-CM | POA: Diagnosis not present

## 2019-12-16 DIAGNOSIS — E162 Hypoglycemia, unspecified: Secondary | ICD-10-CM | POA: Diagnosis not present

## 2019-12-16 DIAGNOSIS — H409 Unspecified glaucoma: Secondary | ICD-10-CM | POA: Diagnosis not present

## 2020-01-22 ENCOUNTER — Encounter: Payer: Medicare Other | Admitting: Obstetrics and Gynecology

## 2020-01-22 ENCOUNTER — Encounter: Payer: Medicare Other | Admitting: Gynecology

## 2020-01-27 ENCOUNTER — Other Ambulatory Visit: Payer: Self-pay | Admitting: Obstetrics and Gynecology

## 2020-01-27 DIAGNOSIS — Z1231 Encounter for screening mammogram for malignant neoplasm of breast: Secondary | ICD-10-CM

## 2020-01-28 ENCOUNTER — Encounter: Payer: Medicare Other | Admitting: Obstetrics and Gynecology

## 2020-02-17 DIAGNOSIS — Z794 Long term (current) use of insulin: Secondary | ICD-10-CM | POA: Diagnosis not present

## 2020-02-17 DIAGNOSIS — N1831 Chronic kidney disease, stage 3a: Secondary | ICD-10-CM | POA: Diagnosis not present

## 2020-02-17 DIAGNOSIS — I129 Hypertensive chronic kidney disease with stage 1 through stage 4 chronic kidney disease, or unspecified chronic kidney disease: Secondary | ICD-10-CM | POA: Diagnosis not present

## 2020-02-17 DIAGNOSIS — Z940 Kidney transplant status: Secondary | ICD-10-CM | POA: Diagnosis not present

## 2020-02-17 DIAGNOSIS — E1021 Type 1 diabetes mellitus with diabetic nephropathy: Secondary | ICD-10-CM | POA: Diagnosis not present

## 2020-02-25 ENCOUNTER — Ambulatory Visit
Admission: RE | Admit: 2020-02-25 | Discharge: 2020-02-25 | Disposition: A | Discharge status: Home / Self Care | Payer: Medicare Other | Source: Ambulatory Visit | Attending: Obstetrics and Gynecology | Admitting: Obstetrics and Gynecology

## 2020-02-25 ENCOUNTER — Other Ambulatory Visit: Payer: Self-pay

## 2020-02-25 DIAGNOSIS — Z1231 Encounter for screening mammogram for malignant neoplasm of breast: Secondary | ICD-10-CM

## 2020-03-01 ENCOUNTER — Encounter: Payer: Medicare Other | Admitting: Obstetrics and Gynecology

## 2020-03-15 DIAGNOSIS — H401110 Primary open-angle glaucoma, right eye, stage unspecified: Secondary | ICD-10-CM | POA: Diagnosis not present

## 2020-03-15 DIAGNOSIS — H401112 Primary open-angle glaucoma, right eye, moderate stage: Secondary | ICD-10-CM | POA: Diagnosis not present

## 2020-03-17 DIAGNOSIS — Z940 Kidney transplant status: Secondary | ICD-10-CM | POA: Diagnosis not present

## 2020-03-23 DIAGNOSIS — H401110 Primary open-angle glaucoma, right eye, stage unspecified: Secondary | ICD-10-CM | POA: Diagnosis not present

## 2020-03-25 ENCOUNTER — Ambulatory Visit (INDEPENDENT_AMBULATORY_CARE_PROVIDER_SITE_OTHER): Payer: Medicare Other | Admitting: Obstetrics and Gynecology

## 2020-03-25 ENCOUNTER — Other Ambulatory Visit: Payer: Self-pay

## 2020-03-25 ENCOUNTER — Encounter: Payer: Self-pay | Admitting: Obstetrics and Gynecology

## 2020-03-25 VITALS — BP 118/76 | Ht 65.5 in | Wt 118.0 lb

## 2020-03-25 DIAGNOSIS — Z01419 Encounter for gynecological examination (general) (routine) without abnormal findings: Secondary | ICD-10-CM | POA: Diagnosis not present

## 2020-03-25 DIAGNOSIS — M8588 Other specified disorders of bone density and structure, other site: Secondary | ICD-10-CM

## 2020-03-25 DIAGNOSIS — M8580 Other specified disorders of bone density and structure, unspecified site: Secondary | ICD-10-CM

## 2020-03-25 NOTE — Progress Notes (Signed)
BRIUNNA LEICHT Aug 09, 1945 [id number]  SUBJECTIVE:  74 y.o. G2P2 female here for a breast and pelvic exam. She has no gynecologic concerns.  Current Outpatient Medications  Medication Sig Dispense Refill  . amLODipine (NORVASC) 10 MG tablet Take 10 mg by mouth at bedtime.    Mar[place] Kitchen ezetimibe-simvastatin (VYTORIN) 10-20 MG per tablet Take 1 tablet by mouth at bedtime. Take Mon, Wed, Fri at [facility]    . FLUoxetine (PROZAC) 40 MG capsule Take 40 mg by mouth every morning.     . insulin aspart (NOVOLOG) 100 UNIT/ML injection Inject 12 Units into the skin 4 (four) times daily. Sliding Scale    . Insulin Glargine (LANTUS [place]) Inject 9 Units into the skin daily. 9 units in AM    . metoprolol tartrate (LOPRESSOR) 25 MG tablet Take 50 mg by mouth 2 (two) times daily.     . Multiple Vitamin (MULTIVITAMIN) capsule Take 1 capsule by mouth daily.      . mycophenolate (MYFORTIC) 180 MG EC tablet Take 180-360 mg by mouth 2 (two) times daily. Takes 2 in the morning and 1 at night    . NOVOFINE 32G X 6 MM MISC as needed. Reported on 01/06/2016    . omeprazole (PRILOSEC) 20 MG capsule Take 20 mg by mouth daily.    . simvastatin (ZOCOR) 40 MG tablet Take 40 mg by mouth daily.    . tacrolimus (PROGRAF) 1 MG capsule Take 1-2 mg by mouth 2 (two) times daily. Takes 2 in the morning and 1 at night    . zinc gluconate 50 MG tablet Take 50 mg by mouth daily.     Current Facility-Administered Medications  Medication Dose Route Frequency Provider Last Rate Last Admin  . 0.9 %  sodium chloride infusion  500 mL Intravenous Once Irene Shipper, MD       Allergies: Latex and Other  No LMP recorded. Patient is postmenopausal.  Past medical history,surgical history, problem list, medications, allergies, family history and social history were all reviewed and documented as reviewed in the EPIC chart.  GYN ROS: no abnormal bleeding, pelvic pain or discharge, no breast pain or new or enlarging lumps on self exam.  No dysuria,  urinary frequency, pain with urination, cloudy/malodorous urine.   OBJECTIVE:  Ht 5' 5.5" (1.664 m)   Wt 118 lb (53.5 kg)   BMI 19.34 kg/m  The patient appears well, alert, oriented, in no distress.  BREAST EXAM: breasts appear normal, no suspicious masses, no skin or nipple changes or axillary nodes  PELVIC EXAM: VULVA: normal appearing vulva with atrophic change, no masses, tenderness or lesions, VAGINA: normal appearing vagina with atrophic change, normal color and discharge, no lesions, CERVIX: normal appearing atrophic cervix without discharge or lesions, UTERUS: uterus is normal size, shape, consistency and nontender, ADNEXA: normal adnexa in size, nontender and no masses  Chaperone: Aurora Mask (DNP student) present during the examination and performed the pelvic exam with me in attendance to confirm the exam findings   ASSESSMENT:  74 y.o. G2P2 here for a breast and pelvic exam  PLAN:   1. Postmenopausal.  No significant hot flashes or night sweats.  No vaginal bleeding. 2. Pap smear 12/2017.  Will address if she desires to continue Pap smear screening at the next 3 year interval. 3. Mammogram 02/2020.  Normal breast exam today.  Continue with mammograms.   4. Colonoscopy 2019.  She will follow up at the interval recommended by her GI specialist.  5.  Osteopenia.  DEXA 03/2018.  T score -1.9, FRAX 9.7% / 2.1%.  Per previous notes, she transiently was on Prolia a few years back but discontinued due to cost.  She did not tolerate Fosamax due to GI upset.  Next DEXA recommended this year so she plans to schedule this. 6. Health maintenance.  No labs today as she normally has these completed elsewhere.  Return annually or sooner, prn.  Joseph Pierini MD 03/25/20

## 2020-04-21 DIAGNOSIS — E785 Hyperlipidemia, unspecified: Secondary | ICD-10-CM | POA: Diagnosis not present

## 2020-04-21 DIAGNOSIS — E1021 Type 1 diabetes mellitus with diabetic nephropathy: Secondary | ICD-10-CM | POA: Diagnosis not present

## 2020-05-12 ENCOUNTER — Ambulatory Visit (INDEPENDENT_AMBULATORY_CARE_PROVIDER_SITE_OTHER): Payer: Medicare Other

## 2020-05-12 ENCOUNTER — Other Ambulatory Visit: Payer: Self-pay

## 2020-05-12 ENCOUNTER — Other Ambulatory Visit: Payer: Self-pay | Admitting: Obstetrics and Gynecology

## 2020-05-12 DIAGNOSIS — M8588 Other specified disorders of bone density and structure, other site: Secondary | ICD-10-CM

## 2020-05-12 DIAGNOSIS — M8589 Other specified disorders of bone density and structure, multiple sites: Secondary | ICD-10-CM | POA: Diagnosis not present

## 2020-05-12 DIAGNOSIS — Z780 Asymptomatic menopausal state: Secondary | ICD-10-CM | POA: Diagnosis not present

## 2020-05-12 DIAGNOSIS — Z01419 Encounter for gynecological examination (general) (routine) without abnormal findings: Secondary | ICD-10-CM

## 2020-05-12 DIAGNOSIS — M8580 Other specified disorders of bone density and structure, unspecified site: Secondary | ICD-10-CM

## 2020-05-19 DIAGNOSIS — N184 Chronic kidney disease, stage 4 (severe): Secondary | ICD-10-CM | POA: Diagnosis not present

## 2020-05-19 DIAGNOSIS — I129 Hypertensive chronic kidney disease with stage 1 through stage 4 chronic kidney disease, or unspecified chronic kidney disease: Secondary | ICD-10-CM | POA: Diagnosis not present

## 2020-05-19 DIAGNOSIS — Z23 Encounter for immunization: Secondary | ICD-10-CM | POA: Diagnosis not present

## 2020-05-19 DIAGNOSIS — Z794 Long term (current) use of insulin: Secondary | ICD-10-CM | POA: Diagnosis not present

## 2020-05-19 DIAGNOSIS — E1021 Type 1 diabetes mellitus with diabetic nephropathy: Secondary | ICD-10-CM | POA: Diagnosis not present

## 2020-06-08 DIAGNOSIS — Z1331 Encounter for screening for depression: Secondary | ICD-10-CM | POA: Diagnosis not present

## 2020-06-08 DIAGNOSIS — M8580 Other specified disorders of bone density and structure, unspecified site: Secondary | ICD-10-CM | POA: Diagnosis not present

## 2020-06-08 DIAGNOSIS — I679 Cerebrovascular disease, unspecified: Secondary | ICD-10-CM | POA: Diagnosis not present

## 2020-06-08 DIAGNOSIS — E785 Hyperlipidemia, unspecified: Secondary | ICD-10-CM | POA: Diagnosis not present

## 2020-06-08 DIAGNOSIS — Z1339 Encounter for screening examination for other mental health and behavioral disorders: Secondary | ICD-10-CM | POA: Diagnosis not present

## 2020-06-08 DIAGNOSIS — F329 Major depressive disorder, single episode, unspecified: Secondary | ICD-10-CM | POA: Diagnosis not present

## 2020-06-08 DIAGNOSIS — Z1212 Encounter for screening for malignant neoplasm of rectum: Secondary | ICD-10-CM | POA: Diagnosis not present

## 2020-06-08 DIAGNOSIS — I5189 Other ill-defined heart diseases: Secondary | ICD-10-CM | POA: Diagnosis not present

## 2020-06-08 DIAGNOSIS — E103523 Type 1 diabetes mellitus with proliferative diabetic retinopathy with traction retinal detachment involving the macula, bilateral: Secondary | ICD-10-CM | POA: Diagnosis not present

## 2020-06-08 DIAGNOSIS — R82998 Other abnormal findings in urine: Secondary | ICD-10-CM | POA: Diagnosis not present

## 2020-06-08 DIAGNOSIS — I129 Hypertensive chronic kidney disease with stage 1 through stage 4 chronic kidney disease, or unspecified chronic kidney disease: Secondary | ICD-10-CM | POA: Diagnosis not present

## 2020-06-08 DIAGNOSIS — I6523 Occlusion and stenosis of bilateral carotid arteries: Secondary | ICD-10-CM | POA: Diagnosis not present

## 2020-06-08 DIAGNOSIS — N2581 Secondary hyperparathyroidism of renal origin: Secondary | ICD-10-CM | POA: Diagnosis not present

## 2020-06-08 DIAGNOSIS — E1021 Type 1 diabetes mellitus with diabetic nephropathy: Secondary | ICD-10-CM | POA: Diagnosis not present

## 2020-08-18 ENCOUNTER — Encounter: Payer: Self-pay | Admitting: Internal Medicine

## 2020-08-18 DIAGNOSIS — I10 Essential (primary) hypertension: Secondary | ICD-10-CM | POA: Diagnosis not present

## 2020-08-18 DIAGNOSIS — F4320 Adjustment disorder, unspecified: Secondary | ICD-10-CM | POA: Diagnosis not present

## 2020-08-18 DIAGNOSIS — Z940 Kidney transplant status: Secondary | ICD-10-CM | POA: Diagnosis not present

## 2020-08-18 DIAGNOSIS — I129 Hypertensive chronic kidney disease with stage 1 through stage 4 chronic kidney disease, or unspecified chronic kidney disease: Secondary | ICD-10-CM | POA: Diagnosis not present

## 2020-08-18 DIAGNOSIS — E785 Hyperlipidemia, unspecified: Secondary | ICD-10-CM | POA: Diagnosis not present

## 2020-08-18 DIAGNOSIS — E1021 Type 1 diabetes mellitus with diabetic nephropathy: Secondary | ICD-10-CM | POA: Diagnosis not present

## 2020-08-18 DIAGNOSIS — Z794 Long term (current) use of insulin: Secondary | ICD-10-CM | POA: Diagnosis not present

## 2020-09-20 DIAGNOSIS — Z940 Kidney transplant status: Secondary | ICD-10-CM | POA: Diagnosis not present

## 2020-09-22 DIAGNOSIS — I480 Paroxysmal atrial fibrillation: Secondary | ICD-10-CM | POA: Diagnosis not present

## 2020-09-22 DIAGNOSIS — F329 Major depressive disorder, single episode, unspecified: Secondary | ICD-10-CM | POA: Diagnosis not present

## 2020-09-22 DIAGNOSIS — N2581 Secondary hyperparathyroidism of renal origin: Secondary | ICD-10-CM | POA: Diagnosis not present

## 2020-09-22 DIAGNOSIS — M8580 Other specified disorders of bone density and structure, unspecified site: Secondary | ICD-10-CM | POA: Diagnosis not present

## 2020-09-22 DIAGNOSIS — Z940 Kidney transplant status: Secondary | ICD-10-CM | POA: Diagnosis not present

## 2020-09-22 DIAGNOSIS — I359 Nonrheumatic aortic valve disorder, unspecified: Secondary | ICD-10-CM | POA: Diagnosis not present

## 2020-09-22 DIAGNOSIS — I5189 Other ill-defined heart diseases: Secondary | ICD-10-CM | POA: Diagnosis not present

## 2020-09-22 DIAGNOSIS — E103523 Type 1 diabetes mellitus with proliferative diabetic retinopathy with traction retinal detachment involving the macula, bilateral: Secondary | ICD-10-CM | POA: Diagnosis not present

## 2020-09-22 DIAGNOSIS — I129 Hypertensive chronic kidney disease with stage 1 through stage 4 chronic kidney disease, or unspecified chronic kidney disease: Secondary | ICD-10-CM | POA: Diagnosis not present

## 2020-09-22 DIAGNOSIS — E1021 Type 1 diabetes mellitus with diabetic nephropathy: Secondary | ICD-10-CM | POA: Diagnosis not present

## 2020-09-22 DIAGNOSIS — I700 Atherosclerosis of aorta: Secondary | ICD-10-CM | POA: Diagnosis not present

## 2020-09-22 DIAGNOSIS — I679 Cerebrovascular disease, unspecified: Secondary | ICD-10-CM | POA: Diagnosis not present

## 2020-09-24 DIAGNOSIS — E113522 Type 2 diabetes mellitus with proliferative diabetic retinopathy with traction retinal detachment involving the macula, left eye: Secondary | ICD-10-CM | POA: Diagnosis not present

## 2020-09-24 DIAGNOSIS — H40003 Preglaucoma, unspecified, bilateral: Secondary | ICD-10-CM | POA: Diagnosis not present

## 2020-12-24 DIAGNOSIS — Z940 Kidney transplant status: Secondary | ICD-10-CM | POA: Diagnosis not present

## 2021-01-20 ENCOUNTER — Other Ambulatory Visit: Payer: Self-pay | Admitting: Nurse Practitioner

## 2021-01-20 DIAGNOSIS — Z1231 Encounter for screening mammogram for malignant neoplasm of breast: Secondary | ICD-10-CM

## 2021-02-02 DIAGNOSIS — I359 Nonrheumatic aortic valve disorder, unspecified: Secondary | ICD-10-CM | POA: Diagnosis not present

## 2021-02-02 DIAGNOSIS — N2581 Secondary hyperparathyroidism of renal origin: Secondary | ICD-10-CM | POA: Diagnosis not present

## 2021-02-02 DIAGNOSIS — E785 Hyperlipidemia, unspecified: Secondary | ICD-10-CM | POA: Diagnosis not present

## 2021-02-02 DIAGNOSIS — Z794 Long term (current) use of insulin: Secondary | ICD-10-CM | POA: Diagnosis not present

## 2021-02-02 DIAGNOSIS — F329 Major depressive disorder, single episode, unspecified: Secondary | ICD-10-CM | POA: Diagnosis not present

## 2021-02-02 DIAGNOSIS — I700 Atherosclerosis of aorta: Secondary | ICD-10-CM | POA: Diagnosis not present

## 2021-02-02 DIAGNOSIS — D696 Thrombocytopenia, unspecified: Secondary | ICD-10-CM | POA: Diagnosis not present

## 2021-02-02 DIAGNOSIS — M8580 Other specified disorders of bone density and structure, unspecified site: Secondary | ICD-10-CM | POA: Diagnosis not present

## 2021-02-02 DIAGNOSIS — Z940 Kidney transplant status: Secondary | ICD-10-CM | POA: Diagnosis not present

## 2021-02-02 DIAGNOSIS — D649 Anemia, unspecified: Secondary | ICD-10-CM | POA: Diagnosis not present

## 2021-02-02 DIAGNOSIS — I129 Hypertensive chronic kidney disease with stage 1 through stage 4 chronic kidney disease, or unspecified chronic kidney disease: Secondary | ICD-10-CM | POA: Diagnosis not present

## 2021-02-02 DIAGNOSIS — E1021 Type 1 diabetes mellitus with diabetic nephropathy: Secondary | ICD-10-CM | POA: Diagnosis not present

## 2021-03-15 ENCOUNTER — Ambulatory Visit: Payer: Medicare Other

## 2021-03-28 ENCOUNTER — Ambulatory Visit: Payer: Medicare Other | Admitting: Nurse Practitioner

## 2021-03-28 ENCOUNTER — Encounter: Payer: Medicare Other | Admitting: Obstetrics and Gynecology

## 2021-04-01 DIAGNOSIS — Z940 Kidney transplant status: Secondary | ICD-10-CM | POA: Diagnosis not present

## 2021-04-05 DIAGNOSIS — H40001 Preglaucoma, unspecified, right eye: Secondary | ICD-10-CM | POA: Diagnosis not present

## 2021-04-06 DIAGNOSIS — Z940 Kidney transplant status: Secondary | ICD-10-CM | POA: Diagnosis not present

## 2021-04-06 DIAGNOSIS — N189 Chronic kidney disease, unspecified: Secondary | ICD-10-CM | POA: Diagnosis not present

## 2021-04-06 DIAGNOSIS — D849 Immunodeficiency, unspecified: Secondary | ICD-10-CM | POA: Diagnosis not present

## 2021-04-06 DIAGNOSIS — D693 Immune thrombocytopenic purpura: Secondary | ICD-10-CM | POA: Diagnosis not present

## 2021-04-06 DIAGNOSIS — D631 Anemia in chronic kidney disease: Secondary | ICD-10-CM | POA: Diagnosis not present

## 2021-04-06 DIAGNOSIS — I129 Hypertensive chronic kidney disease with stage 1 through stage 4 chronic kidney disease, or unspecified chronic kidney disease: Secondary | ICD-10-CM | POA: Diagnosis not present

## 2021-04-06 DIAGNOSIS — E1122 Type 2 diabetes mellitus with diabetic chronic kidney disease: Secondary | ICD-10-CM | POA: Diagnosis not present

## 2021-04-18 ENCOUNTER — Ambulatory Visit: Payer: Medicare Other

## 2021-04-20 DIAGNOSIS — I129 Hypertensive chronic kidney disease with stage 1 through stage 4 chronic kidney disease, or unspecified chronic kidney disease: Secondary | ICD-10-CM | POA: Diagnosis not present

## 2021-04-20 DIAGNOSIS — Z23 Encounter for immunization: Secondary | ICD-10-CM | POA: Diagnosis not present

## 2021-04-20 DIAGNOSIS — E1021 Type 1 diabetes mellitus with diabetic nephropathy: Secondary | ICD-10-CM | POA: Diagnosis not present

## 2021-04-20 DIAGNOSIS — Z940 Kidney transplant status: Secondary | ICD-10-CM | POA: Diagnosis not present

## 2021-04-20 DIAGNOSIS — Z794 Long term (current) use of insulin: Secondary | ICD-10-CM | POA: Diagnosis not present

## 2021-04-20 DIAGNOSIS — E785 Hyperlipidemia, unspecified: Secondary | ICD-10-CM | POA: Diagnosis not present

## 2021-05-12 ENCOUNTER — Ambulatory Visit: Payer: Medicare Other | Admitting: Nurse Practitioner

## 2021-06-30 DIAGNOSIS — Z940 Kidney transplant status: Secondary | ICD-10-CM | POA: Diagnosis not present

## 2021-07-12 DIAGNOSIS — Z794 Long term (current) use of insulin: Secondary | ICD-10-CM | POA: Diagnosis not present

## 2021-07-12 DIAGNOSIS — Z940 Kidney transplant status: Secondary | ICD-10-CM | POA: Diagnosis not present

## 2021-07-12 DIAGNOSIS — I129 Hypertensive chronic kidney disease with stage 1 through stage 4 chronic kidney disease, or unspecified chronic kidney disease: Secondary | ICD-10-CM | POA: Diagnosis not present

## 2021-07-12 DIAGNOSIS — E1021 Type 1 diabetes mellitus with diabetic nephropathy: Secondary | ICD-10-CM | POA: Diagnosis not present

## 2021-08-01 ENCOUNTER — Other Ambulatory Visit (HOSPITAL_COMMUNITY): Payer: Self-pay | Admitting: Endocrinology

## 2021-08-01 DIAGNOSIS — E785 Hyperlipidemia, unspecified: Secondary | ICD-10-CM | POA: Diagnosis not present

## 2021-08-01 DIAGNOSIS — R0609 Other forms of dyspnea: Secondary | ICD-10-CM | POA: Diagnosis not present

## 2021-08-01 DIAGNOSIS — K222 Esophageal obstruction: Secondary | ICD-10-CM | POA: Diagnosis not present

## 2021-08-01 DIAGNOSIS — I700 Atherosclerosis of aorta: Secondary | ICD-10-CM | POA: Diagnosis not present

## 2021-08-01 DIAGNOSIS — I129 Hypertensive chronic kidney disease with stage 1 through stage 4 chronic kidney disease, or unspecified chronic kidney disease: Secondary | ICD-10-CM | POA: Diagnosis not present

## 2021-08-01 DIAGNOSIS — Z940 Kidney transplant status: Secondary | ICD-10-CM | POA: Diagnosis not present

## 2021-08-01 DIAGNOSIS — M8580 Other specified disorders of bone density and structure, unspecified site: Secondary | ICD-10-CM | POA: Diagnosis not present

## 2021-08-01 DIAGNOSIS — E1021 Type 1 diabetes mellitus with diabetic nephropathy: Secondary | ICD-10-CM | POA: Diagnosis not present

## 2021-08-01 DIAGNOSIS — F331 Major depressive disorder, recurrent, moderate: Secondary | ICD-10-CM | POA: Diagnosis not present

## 2021-08-01 DIAGNOSIS — I480 Paroxysmal atrial fibrillation: Secondary | ICD-10-CM | POA: Diagnosis not present

## 2021-08-01 DIAGNOSIS — I6523 Occlusion and stenosis of bilateral carotid arteries: Secondary | ICD-10-CM | POA: Diagnosis not present

## 2021-08-01 DIAGNOSIS — E871 Hypo-osmolality and hyponatremia: Secondary | ICD-10-CM | POA: Diagnosis not present

## 2021-08-01 DIAGNOSIS — I359 Nonrheumatic aortic valve disorder, unspecified: Secondary | ICD-10-CM

## 2021-08-08 ENCOUNTER — Other Ambulatory Visit (HOSPITAL_COMMUNITY): Payer: Medicare Other

## 2021-08-09 ENCOUNTER — Ambulatory Visit (HOSPITAL_COMMUNITY)
Admission: RE | Admit: 2021-08-09 | Discharge: 2021-08-09 | Disposition: A | Discharge status: Home / Self Care | Payer: Medicare Other | Source: Ambulatory Visit | Attending: Endocrinology | Admitting: Endocrinology

## 2021-08-09 ENCOUNTER — Other Ambulatory Visit: Payer: Self-pay

## 2021-08-09 DIAGNOSIS — I359 Nonrheumatic aortic valve disorder, unspecified: Secondary | ICD-10-CM | POA: Insufficient documentation

## 2021-08-09 DIAGNOSIS — I700 Atherosclerosis of aorta: Secondary | ICD-10-CM | POA: Diagnosis not present

## 2021-08-09 DIAGNOSIS — E119 Type 2 diabetes mellitus without complications: Secondary | ICD-10-CM | POA: Insufficient documentation

## 2021-08-09 DIAGNOSIS — I350 Nonrheumatic aortic (valve) stenosis: Secondary | ICD-10-CM | POA: Insufficient documentation

## 2021-08-09 DIAGNOSIS — I119 Hypertensive heart disease without heart failure: Secondary | ICD-10-CM | POA: Insufficient documentation

## 2021-08-09 DIAGNOSIS — I340 Nonrheumatic mitral (valve) insufficiency: Secondary | ICD-10-CM | POA: Diagnosis not present

## 2021-08-09 LAB — ECHOCARDIOGRAM COMPLETE
AR max vel: 0.96 cm2
AV Area VTI: 0.93 cm2
AV Area mean vel: 0.81 cm2
AV Mean grad: 36 mmHg
AV Peak grad: 58.4 mmHg
Ao pk vel: 3.82 m/s
Area-P 1/2: 2.76 cm2
MV VTI: 1.91 cm2
S' Lateral: 2.5 cm

## 2021-08-11 DIAGNOSIS — R059 Cough, unspecified: Secondary | ICD-10-CM | POA: Diagnosis not present

## 2021-08-15 DIAGNOSIS — R0609 Other forms of dyspnea: Secondary | ICD-10-CM | POA: Diagnosis not present

## 2021-09-30 DIAGNOSIS — Z940 Kidney transplant status: Secondary | ICD-10-CM | POA: Diagnosis not present

## 2021-10-24 DIAGNOSIS — Z794 Long term (current) use of insulin: Secondary | ICD-10-CM | POA: Diagnosis not present

## 2021-10-24 DIAGNOSIS — Z940 Kidney transplant status: Secondary | ICD-10-CM | POA: Diagnosis not present

## 2021-10-24 DIAGNOSIS — E1021 Type 1 diabetes mellitus with diabetic nephropathy: Secondary | ICD-10-CM | POA: Diagnosis not present

## 2021-10-24 DIAGNOSIS — F331 Major depressive disorder, recurrent, moderate: Secondary | ICD-10-CM | POA: Diagnosis not present

## 2021-10-24 DIAGNOSIS — E785 Hyperlipidemia, unspecified: Secondary | ICD-10-CM | POA: Diagnosis not present

## 2021-10-24 DIAGNOSIS — I129 Hypertensive chronic kidney disease with stage 1 through stage 4 chronic kidney disease, or unspecified chronic kidney disease: Secondary | ICD-10-CM | POA: Diagnosis not present

## 2021-11-14 DIAGNOSIS — E103591 Type 1 diabetes mellitus with proliferative diabetic retinopathy without macular edema, right eye: Secondary | ICD-10-CM | POA: Diagnosis not present

## 2021-11-14 DIAGNOSIS — H40001 Preglaucoma, unspecified, right eye: Secondary | ICD-10-CM | POA: Diagnosis not present

## 2021-12-06 NOTE — Patient Outreach (Signed)
[place] [facility]) Care Management  12/06/2021  Jasmine Cummings January 28, 1946 [id number]   Received referral for Care Management from Insurance plan. Assigned patient to Joellyn Quails, RN Care Coordinator for follow up.   [place] Management Assistant [phone]

## 2021-12-22 DIAGNOSIS — F331 Major depressive disorder, recurrent, moderate: Secondary | ICD-10-CM | POA: Diagnosis not present

## 2021-12-22 DIAGNOSIS — I10 Essential (primary) hypertension: Secondary | ICD-10-CM | POA: Diagnosis not present

## 2021-12-22 DIAGNOSIS — M8580 Other specified disorders of bone density and structure, unspecified site: Secondary | ICD-10-CM | POA: Diagnosis not present

## 2021-12-22 DIAGNOSIS — I480 Paroxysmal atrial fibrillation: Secondary | ICD-10-CM | POA: Diagnosis not present

## 2021-12-22 DIAGNOSIS — Z794 Long term (current) use of insulin: Secondary | ICD-10-CM | POA: Diagnosis not present

## 2021-12-22 DIAGNOSIS — E1021 Type 1 diabetes mellitus with diabetic nephropathy: Secondary | ICD-10-CM | POA: Diagnosis not present

## 2021-12-22 DIAGNOSIS — I700 Atherosclerosis of aorta: Secondary | ICD-10-CM | POA: Diagnosis not present

## 2021-12-22 DIAGNOSIS — Z940 Kidney transplant status: Secondary | ICD-10-CM | POA: Diagnosis not present

## 2021-12-22 DIAGNOSIS — I129 Hypertensive chronic kidney disease with stage 1 through stage 4 chronic kidney disease, or unspecified chronic kidney disease: Secondary | ICD-10-CM | POA: Diagnosis not present

## 2021-12-22 DIAGNOSIS — E785 Hyperlipidemia, unspecified: Secondary | ICD-10-CM | POA: Diagnosis not present

## 2021-12-22 DIAGNOSIS — E871 Hypo-osmolality and hyponatremia: Secondary | ICD-10-CM | POA: Diagnosis not present

## 2021-12-22 DIAGNOSIS — I359 Nonrheumatic aortic valve disorder, unspecified: Secondary | ICD-10-CM | POA: Diagnosis not present

## 2021-12-23 ENCOUNTER — Other Ambulatory Visit: Payer: Self-pay | Admitting: *Deleted

## 2021-12-27 ENCOUNTER — Other Ambulatory Visit: Payer: Self-pay | Admitting: *Deleted

## 2021-12-28 ENCOUNTER — Other Ambulatory Visit: Payer: Self-pay | Admitting: *Deleted

## 2021-12-28 NOTE — Patient Outreach (Signed)
[place] [facility]) Care Management  12/28/2021  Jasmine Cummings [birth year] [id number]   [facility] Unsuccessful outreach #3 Outreach attempt to the listed at the preferred outreach number in [place] No answer. [facility] RN CM left HIPAA [facility] Portability and Accountability Act) compliant voicemail message along with CM's contact info.                    Plan: [facility] RN CM scheduled this patient for case closure Unsuccessful outreach letter sent on 12/23/21 Unsuccessful outreach on 12/23/21 12/27/21, 12/28/21     Joelene Millin L. Lavina Hamman, RN, BSN, [place]

## 2022-01-05 ENCOUNTER — Other Ambulatory Visit: Payer: Self-pay | Admitting: *Deleted

## 2022-01-05 NOTE — Patient Outreach (Signed)
[place] [facility]) Care Management  01/05/2022  ASHLING ROANE 08/04/1945 [id number]   [facility] Case closure   Unsuccessful outreach on 12/23/21 12/27/21, 12/28/21  Unsuccessful outreach letter sent on 12/23/21 without a response   Plan [facility] RN CM will close case after no response from patient  Unable to reach Case closure letter sent to MD  Joelene Millin L. Lavina Hamman, RN, BSN, [place] Coordinator Office number [phone] [facility] number [phone] Fax number [phone]

## 2022-02-16 DIAGNOSIS — I129 Hypertensive chronic kidney disease with stage 1 through stage 4 chronic kidney disease, or unspecified chronic kidney disease: Secondary | ICD-10-CM | POA: Diagnosis not present

## 2022-02-16 DIAGNOSIS — Z940 Kidney transplant status: Secondary | ICD-10-CM | POA: Diagnosis not present

## 2022-02-16 DIAGNOSIS — E785 Hyperlipidemia, unspecified: Secondary | ICD-10-CM | POA: Diagnosis not present

## 2022-02-16 DIAGNOSIS — Z794 Long term (current) use of insulin: Secondary | ICD-10-CM | POA: Diagnosis not present

## 2022-02-16 DIAGNOSIS — E1021 Type 1 diabetes mellitus with diabetic nephropathy: Secondary | ICD-10-CM | POA: Diagnosis not present

## 2022-04-18 ENCOUNTER — Encounter: Payer: Self-pay | Admitting: *Deleted

## 2022-04-19 ENCOUNTER — Encounter: Payer: Self-pay | Admitting: Psychiatry

## 2022-04-19 ENCOUNTER — Ambulatory Visit (INDEPENDENT_AMBULATORY_CARE_PROVIDER_SITE_OTHER): Payer: Medicare Other | Admitting: Psychiatry

## 2022-04-19 VITALS — BP 170/74 | HR 56 | Ht 67.0 in | Wt 117.0 lb

## 2022-04-19 DIAGNOSIS — R413 Other amnesia: Secondary | ICD-10-CM | POA: Diagnosis not present

## 2022-04-19 NOTE — Progress Notes (Signed)
[facility]  PATIENT: Jasmine Cummings DOB: 16-Aug-1945  REFERRING CLINICIAN: Reynold Bowen, MD HISTORY FROM: self, daughter Denice REASON FOR VISIT: memory loss   HISTORICAL  CHIEF COMPLAINT:  Chief Complaint  Patient presents with   Memory Loss    RM 1 with daughter Denice Pt is well, here for memory loss for about 58month-12yr    HISTORY OF PRESENT ILLNESS:  The patient presents for evaluation of memory loss which has been present over the past 6 months-1 year.  She will go in the closet and forget what she was looking for. Will repeat questions. Has started to become overwhelmed when managing her bills. Struggles to read and spell certain words. Was a passenger in a car last month and forgot where she was. She is driving, but is only driving to close locations that are familiar to her. Has not gotten lost while driving.  Has been less active lately. Used to walk 5 miles a day, stopped walking 7 months ago. Feels like she has a lack of motivation. She does struggle with balance and has had a couple of falls. Most recent fall was one year ago. At that time she was on the floor for an hour before she could get help. Feels dizzy when turning her head or walking up and down stairs. Denies room spinning but will feel "woozy" for a couple of seconds. States dizziness seems to be related to her kidney medications. She also has a postural/action tremor which she suspects may be a side effect of her medications. She is unsure if anyone in her family had tremors.  CTH in 2020 showed mild chronic microvascular ischemic disease and was otherwise normal.  TBI:  No past history of TBI Stroke:  no past history of stroke Seizures:  no past history of seizures Sleep: Sleeps well at night, sometimes naps during the day Mood: Feels she lacks motivation. Does do gardening which helps with her mood  Functional status:  Patient lives with her daughter Cooking: Cooks  occasionally but states she doesn't have much of an appetite. Did leave the stove on once. Shopping: Sometimes forgets to pick things up at the store, this is not new for her Driving: Has not gotten lost while driving Bills: She sometimes struggles to manage her finances, feels overwhelmed. Has not missed any bills. Daughter helps her Medications: No issues, use a pill box Ever left the stove on by accident?: yes Getting lost going to familiar places?: yes Forgetting loved ones names?: no issues Word finding difficulty? no  OTHER MEDICAL CONDITIONS: HTN, DM, diabetic retinopathy, kidney transplant, hyponatremia, depression, pAfib, aortic atherosclerosis, glaucoma   REVIEW OF SYSTEMS: Full 14 system review of systems performed and negative with exception of: memory loss, dizziness, shakiness  ALLERGIES: Allergies  Allergen Reactions   Latex Hives    REACTION: swelling Other reaction(s): Unknown   Other Other (See Comments)    [DERM]   Silicone     Other reaction(s): Other (See Comments) [DERM]    HOME MEDICATIONS: Outpatient Medications Prior to Visit  Medication Sig Dispense Refill   ALPRAZolam (XANAX) 0.25 MG tablet one po bid Oral     amLODipine (NORVASC) 10 MG tablet Take 10 mg by mouth at bedtime.     amoxicillin (AMOXIL) 500 MG tablet TAKE 4 TABLETS BY MOUTH 30 MINUTES PRIOR TO DENTAL WORK ORAL 30 DAYS for 30     amoxicillin (AMOXIL) 500 MG tablet SMARTSIG:4 Tablet(s) By Mouth     brimonidine (  ALPHAGAN) 0.2 % ophthalmic solution Place 1 drop into the right eye 2 (two) times daily.     Continuous Blood Gluc Sensor (FREESTYLE LIBRE SENSOR SYSTEM) MISC FreeStyle Libre 14 Day Sensor kit  CHANGE EVERY 14 DAYS DX E10.29     ezetimibe-simvastatin (VYTORIN) 10-20 MG per tablet Take 1 tablet by mouth at bedtime. Take Mon, Wed, Fri at HS     FLUoxetine (PROZAC) 40 MG capsule Take 40 mg by mouth every morning.      insulin aspart (NOVOLOG) 100 UNIT/ML injection Inject 12 Units into  the skin 4 (four) times daily. Sliding Scale     Insulin Glargine (LANTUS ) Inject 9 Units into the skin daily. 9 units in AM     metoprolol tartrate (LOPRESSOR) 25 MG tablet Take 50 mg by mouth 2 (two) times daily.      Multiple Vitamin (MULTIVITAMIN) capsule Take 1 capsule by mouth daily.       mycophenolate (MYFORTIC) 180 MG EC tablet Take 180-360 mg by mouth 2 (two) times daily. Takes 2 in the morning and 1 at night     NOVOFINE 32G X 6 MM MISC as needed. Reported on 01/06/2016     omeprazole (PRILOSEC) 20 MG capsule Take 20 mg by mouth daily.     simvastatin (ZOCOR) 40 MG tablet Take 40 mg by mouth daily.     tacrolimus (PROGRAF) 1 MG capsule Take 1-2 mg by mouth 2 (two) times daily. Takes 2 in the morning and 1 at night     zinc gluconate 50 MG tablet Take 50 mg by mouth daily.     Facility-Administered Medications Prior to Visit  Medication Dose Route Frequency Provider Last Rate Last Admin   0.9 %  sodium chloride infusion  500 mL Intravenous Once Irene Shipper, MD        PAST MEDICAL HISTORY: Past Medical History:  Diagnosis Date   Anemia    Anxiety    Arthritis    Chronic kidney disease    Depression    Diabetes mellitus    Diabetic retinopathy ([place])    GERD (gastroesophageal reflux disease)    Hypertension    Memory impairment    Obsessive compulsive disorder    Osteopenia 03/2018   T score -1.9 FRAX 9.7% / 2.1%   Thrombocytopenia ([place]) 05/31/2015    PAST SURGICAL HISTORY: Past Surgical History:  Procedure Laterality Date   APPENDECTOMY     BREAST EXCISIONAL BIOPSY Bilateral    CATARACT EXTRACTION     CESAREAN SECTION  1968   KIDNEY TRANSPLANT Right 09/2011   RETINAL DETACHMENT SURGERY Left 2006   TUBAL LIGATION  1981    FAMILY HISTORY: Family History  Problem Relation Age of Onset   Stroke Mother    Heart disease Sister    Breast cancer Sister 22   Heart disease Brother    Diabetes Brother    Diabetes Brother     SOCIAL HISTORY: Social History    Socioeconomic History   Marital status: Widowed    Spouse name: Not on file   Number of children: 3   Years of education: Not on file   Highest education level: Not on file  Occupational History   Not on file  Tobacco Use   Smoking status: Never   Smokeless tobacco: Never   Tobacco comments:    never used tobacco  Vaping Use   Vaping Use: Never used  Substance and Sexual Activity   Alcohol use: No  Alcohol/week: 0.0 standard drinks of alcohol   Drug use: No   Sexual activity: Not Currently    Comment: 1st intercourse 64 yo-1 partner  Other Topics Concern   Not on file  Social History Narrative   Not on file   Social Determinants of Health   Financial Resource Strain: Not on file  Food Insecurity: Not on file  Transportation Needs: Not on file  Physical Activity: Not on file  Stress: Not on file  Social Connections: Not on file  Intimate Partner Violence: Not on file     PHYSICAL EXAM  GENERAL EXAM/CONSTITUTIONAL: Vitals:  Vitals:   04/19/22 1039  BP: (!) 170/74  Pulse: (!) 56  Weight: 117 lb (53.1 kg)  Height: '5\' 7"'  (1.702 m)   Body mass index is 18.32 kg/m. Wt Readings from Last 3 Encounters:  04/19/22 117 lb (53.1 kg)  03/25/20 118 lb (53.5 kg)  01/21/19 123 lb (55.8 kg)   NEUROLOGIC: MENTAL STATUS:     04/19/2022   11:12 AM  Montreal Cognitive Assessment   Visuospatial/ Executive (0/5) 0  Naming (0/3) 3  Attention: Read list of digits (0/2) 1  Attention: Read list of letters (0/1) 1  Attention: Serial 7 subtraction starting at 100 (0/3) 0  Language: Repeat phrase (0/2) 0  Language : Fluency (0/1) 0  Abstraction (0/2) 2  Delayed Recall (0/5) 0  Orientation (0/6) 6  Total 13    CRANIAL NERVE:  2nd, 3rd, 4th, 6th - visual fields full to confrontation OD, only light perception OS, extraocular muscles intact, no nystagmus 5th - facial sensation symmetric 7th - facial strength symmetric 8th - hearing intact 9th - palate elevates  symmetrically, uvula midline 11th - shoulder shrug symmetric 12th - tongue protrusion midline  MOTOR:  normal bulk and tone, no cogwheeling, full strength in the BUE, BLE  SENSORY:  normal and symmetric to light touch all 4 extremities  COORDINATION:  finger-nose-finger intact bilaterally, fine finger movements normal, postural and action tremor present in bilateral upper extremities  REFLEXES:  deep tendon reflexes present and symmetric  GAIT/STATION:  Normal stride length and arm swing     DIAGNOSTIC DATA (LABS, IMAGING, TESTING) - I reviewed patient records, labs, notes, testing and imaging myself where available.  Lab Results  Component Value Date   WBC 10.1 10/30/2018   HGB 12.0 10/30/2018   HCT 35.2 (L) 10/30/2018   MCV 85.6 10/30/2018   PLT 135 (L) 10/30/2018      Component Value Date/Time   NA 130 (L) 10/30/2018 2049   NA 136 02/02/2016 1014   K 4.0 10/30/2018 2049   K 4.5 02/02/2016 1014   CL 97 (L) 10/30/2018 2049   CL 106 12/11/2013 0447   CO2 20 (L) 10/30/2018 2049   CO2 28 02/02/2016 1014   GLUCOSE 283 (H) 10/30/2018 2049   GLUCOSE 170 (H) 02/02/2016 1014   BUN 21 10/30/2018 2049   BUN 20.4 02/02/2016 1014   CREATININE 0.86 10/30/2018 2049   CREATININE 1.1 02/02/2016 1014   CALCIUM 8.7 (L) 10/30/2018 2049   CALCIUM 9.3 02/02/2016 1014   PROT 6.8 10/30/2018 2049   PROT 7.3 02/02/2016 1014   ALBUMIN 3.9 10/30/2018 2049   ALBUMIN 4.0 02/02/2016 1014   AST 22 10/30/2018 2049   AST 25 02/02/2016 1014   ALT 21 10/30/2018 2049   ALT 22 02/02/2016 1014   ALKPHOS 68 10/30/2018 2049   ALKPHOS 50 02/02/2016 1014   BILITOT 0.8 10/30/2018 2049  BILITOT 0.50 02/02/2016 1014   GFRNONAA >60 10/30/2018 2049   GFRNONAA 33 (L) 12/11/2013 0447   GFRAA >60 10/30/2018 2049   GFRAA 38 (L) 12/11/2013 0447   No results found for: "CHOL", "HDL", "LDLCALC", "LDLDIRECT", "TRIG", "CHOLHDL" Lab Results  Component Value Date   HGBA1C 8.3 (H) 12/11/2013   No  results found for: "VITAMINB12" Lab Results  Component Value Date   TSH 1.68 12/11/2013    08/01/21 TSH 2.33, BMP with Na 129, CBC wnl    ASSESSMENT AND PLAN  76 y.o. year old female with a history of HTN, DM, diabetic retinopathy, kidney transplant, hyponatremia, depression, pAfib, aortic atherosclerosis, glaucoma who presents for evaluation of memory loss over the past 6 months to 1 year. MOCA today is 13/30, MOCA-BLIND score is 10/22. These scores are concerning for dementia. Will check B12 level today and order MRI brain to assess for signs of neurodegeneration and/or significant vascular disease. Tremor is present on today's exam without any other features of Parkinsonism. She is not sure if anyone in her family had a tremor. This may be secondary to her immunosuppressants as tremor is a possible side effect of both Prograf and Myfortic. Will monitor for now.   1. Memory loss       PLAN: - Labs: B12 - MRI brain - Follow up after testing is complete.   Orders Placed This Encounter  Procedures   MR BRAIN W WO CONTRAST   Vitamin B12    No orders of the defined types were placed in this encounter.   Return in about 6 months (around 10/19/2022).  I spent an average of 61 minutes chart reviewing and counseling the patient, with at least 50% of the time face to face with the patient. Reviewed safety measures including driving safety.   Genia Harold, MD 04/19/22 12:43 PM  [facility] [address] [place], [place] [address] [phone]

## 2022-04-20 ENCOUNTER — Telehealth: Payer: Self-pay

## 2022-04-20 ENCOUNTER — Telehealth: Payer: Self-pay | Admitting: Psychiatry

## 2022-04-20 LAB — VITAMIN B12: Vitamin B-12: 454 pg/mL (ref 232–1245)

## 2022-04-20 NOTE — Telephone Encounter (Signed)
medicare/BCBS sup NPR sent to [facility] for open MRI [phone] 

## 2022-04-20 NOTE — Telephone Encounter (Signed)
Contacted pt LVM per DPR, informing her b12 was normal. Number provided to CB with questions.

## 2022-04-20 NOTE — Telephone Encounter (Signed)
-----   Message from Genia Harold, MD sent at 04/20/2022  9:52 AM EDT ----- B12 level is normal

## 2022-04-25 ENCOUNTER — Ambulatory Visit: Payer: Self-pay

## 2022-04-25 NOTE — Patient Outreach (Signed)
  Care Coordination   04/25/2022 Name: Jasmine Cummings MRN: [id number] DOB: April 09, 1946   Care Coordination Outreach Attempts:  An unsuccessful telephone outreach was attempted today to offer the patient information about available care coordination services as a benefit of their health plan.   Follow Up Plan:  Additional outreach attempts will be made to offer the patient care coordination information and services.   Encounter Outcome:  No Answer  Care Coordination Interventions Activated:  No   Care Coordination Interventions:  No, not indicated    Daneen Schick, BSW, CDP Social Worker, Certified Dementia Practitioner [facility]  Care Coordination [phone]

## 2022-04-26 DIAGNOSIS — Z940 Kidney transplant status: Secondary | ICD-10-CM | POA: Diagnosis not present

## 2022-05-09 DIAGNOSIS — Z23 Encounter for immunization: Secondary | ICD-10-CM | POA: Diagnosis not present

## 2022-05-09 DIAGNOSIS — E1021 Type 1 diabetes mellitus with diabetic nephropathy: Secondary | ICD-10-CM | POA: Diagnosis not present

## 2022-05-09 DIAGNOSIS — M8580 Other specified disorders of bone density and structure, unspecified site: Secondary | ICD-10-CM | POA: Diagnosis not present

## 2022-05-09 DIAGNOSIS — I480 Paroxysmal atrial fibrillation: Secondary | ICD-10-CM | POA: Diagnosis not present

## 2022-05-09 DIAGNOSIS — I700 Atherosclerosis of aorta: Secondary | ICD-10-CM | POA: Diagnosis not present

## 2022-05-09 DIAGNOSIS — I679 Cerebrovascular disease, unspecified: Secondary | ICD-10-CM | POA: Diagnosis not present

## 2022-05-09 DIAGNOSIS — R0609 Other forms of dyspnea: Secondary | ICD-10-CM | POA: Diagnosis not present

## 2022-05-09 DIAGNOSIS — E871 Hypo-osmolality and hyponatremia: Secondary | ICD-10-CM | POA: Diagnosis not present

## 2022-05-09 DIAGNOSIS — F331 Major depressive disorder, recurrent, moderate: Secondary | ICD-10-CM | POA: Diagnosis not present

## 2022-05-09 DIAGNOSIS — R413 Other amnesia: Secondary | ICD-10-CM | POA: Diagnosis not present

## 2022-05-09 DIAGNOSIS — E785 Hyperlipidemia, unspecified: Secondary | ICD-10-CM | POA: Diagnosis not present

## 2022-05-09 DIAGNOSIS — I129 Hypertensive chronic kidney disease with stage 1 through stage 4 chronic kidney disease, or unspecified chronic kidney disease: Secondary | ICD-10-CM | POA: Diagnosis not present

## 2022-05-29 ENCOUNTER — Ambulatory Visit: Payer: Self-pay | Admitting: Cardiology

## 2022-05-29 ENCOUNTER — Encounter (HOSPITAL_COMMUNITY): Payer: Self-pay | Admitting: *Deleted

## 2022-05-29 DIAGNOSIS — I359 Nonrheumatic aortic valve disorder, unspecified: Secondary | ICD-10-CM | POA: Insufficient documentation

## 2022-05-29 DIAGNOSIS — I480 Paroxysmal atrial fibrillation: Secondary | ICD-10-CM | POA: Insufficient documentation

## 2022-05-29 NOTE — Progress Notes (Unsigned)
Patient referred by Adrian Prince, MD for ***  Subjective:   Jasmine Cummings, female    DOB: 10-Sep-1945, 76 y.o.   MRN: [id number]  *** No chief complaint on file.   *** HPI  76 y.o. *** female with ***  *** Past Medical History:  Diagnosis Date   Anemia    Anxiety    Arthritis    Chronic kidney disease    Depression    Diabetes mellitus    Diabetic retinopathy ([facility])    GERD (gastroesophageal reflux disease)    Hypertension    Memory impairment    Obsessive compulsive disorder    Osteopenia 03/2018   T score -1.9 FRAX 9.7% / 2.1%   Thrombocytopenia ([facility]) 05/31/2015    *** Past Surgical History:  Procedure Laterality Date   APPENDECTOMY     BREAST EXCISIONAL BIOPSY Bilateral    CATARACT EXTRACTION     CESAREAN SECTION  1968   KIDNEY TRANSPLANT Right 09/2011   RETINAL DETACHMENT SURGERY Left 2006   TUBAL LIGATION  1981    *** Social History   Tobacco Use  Smoking Status Never  Smokeless Tobacco Never  Tobacco Comments   never used tobacco    Social History   Substance and Sexual Activity  Alcohol Use No   Alcohol/week: 0.0 standard drinks of alcohol    *** Family History  Problem Relation Age of Onset   Stroke Mother    Heart disease Sister    Breast cancer Sister 35   Heart disease Brother    Diabetes Brother    Diabetes Brother     ***  Current Outpatient Medications:    ALPRAZolam (XANAX) 0.25 MG tablet, one po bid Oral, Disp: , Rfl:    amLODipine (NORVASC) 10 MG tablet, Take 10 mg by mouth at bedtime., Disp: , Rfl:    amoxicillin (AMOXIL) 500 MG tablet, TAKE 4 TABLETS BY MOUTH 30 MINUTES PRIOR TO DENTAL WORK ORAL 30 DAYS for 30, Disp: , Rfl:    amoxicillin (AMOXIL) 500 MG tablet, SMARTSIG:4 Tablet(s) By Mouth, Disp: , Rfl:    brimonidine (ALPHAGAN) 0.2 % ophthalmic solution, Place 1 drop into the right eye 2 (two) times daily., Disp: , Rfl:    Continuous Blood Gluc Sensor (FREESTYLE LIBRE SENSOR SYSTEM) MISC, FreeStyle Libre 14  Day Sensor kit  CHANGE EVERY 14 DAYS DX E10.29, Disp: , Rfl:    ezetimibe-simvastatin (VYTORIN) 10-20 MG per tablet, Take 1 tablet by mouth at bedtime. Take Mon, Wed, Fri at HS, Disp: , Rfl:    FLUoxetine (PROZAC) 40 MG capsule, Take 40 mg by mouth every morning. , Disp: , Rfl:    insulin aspart (NOVOLOG) 100 UNIT/ML injection, Inject 12 Units into the skin 4 (four) times daily. Sliding Scale, Disp: , Rfl:    Insulin Glargine (LANTUS [place]), Inject 9 Units into the skin daily. 9 units in AM, Disp: , Rfl:    metoprolol tartrate (LOPRESSOR) 25 MG tablet, Take 50 mg by mouth 2 (two) times daily. , Disp: , Rfl:    Multiple Vitamin (MULTIVITAMIN) capsule, Take 1 capsule by mouth daily.  , Disp: , Rfl:    mycophenolate (MYFORTIC) 180 MG EC tablet, Take 180-360 mg by mouth 2 (two) times daily. Takes 2 in the morning and 1 at night, Disp: , Rfl:    NOVOFINE 32G X 6 MM MISC, as needed. Reported on 01/06/2016, Disp: , Rfl:    omeprazole (PRILOSEC) 20 MG capsule, Take 20 mg by mouth  daily., Disp: , Rfl:    simvastatin (ZOCOR) 40 MG tablet, Take 40 mg by mouth daily., Disp: , Rfl:    tacrolimus (PROGRAF) 1 MG capsule, Take 1-2 mg by mouth 2 (two) times daily. Takes 2 in the morning and 1 at night, Disp: , Rfl:    zinc gluconate 50 MG tablet, Take 50 mg by mouth daily., Disp: , Rfl:   Current Facility-Administered Medications:    0.9 %  sodium chloride infusion, 500 mL, Intravenous, Once, Hilarie Fredrickson, MD   Cardiovascular and other pertinent studies:  Reviewed external labs and tests, independently interpreted  *** EKG ***/***/202***: ***  ***  *** Recent labs: 12/22/2021: Glucose 71, BUN/Cr 16/0.9. EGFR 60. Na/K 137/4.0. Rest of the CMP normal H/H 13/40. MCV 89. Platelets 157 ***HbA1C ***% TSH 2.7 normal  07/2021: Chol 177, TG 76, HDL 79, LDL 83     *** ROS      *** There were no vitals filed for this visit.   There is no height or weight on file to calculate BMI. There were no  vitals filed for this visit.  *** Objective:   Physical Exam    ***     Visit diagnoses: No diagnosis found.   No orders of the defined types were placed in this encounter.    Medication changes this visit: There are no discontinued medications.  No orders of the defined types were placed in this encounter.    Assessment & Recommendations:   ***  ***  Thank you for referring the patient to [place]. Please feel free to contact with any questions.   Elder Negus, MD Pager: [phone] Office: [phone]

## 2022-06-07 ENCOUNTER — Emergency Department: Payer: Medicare Other

## 2022-06-07 ENCOUNTER — Other Ambulatory Visit: Payer: Self-pay

## 2022-06-07 ENCOUNTER — Inpatient Hospital Stay
Admission: EM | Admit: 2022-06-07 | Discharge: 2022-07-03 | DRG: 208 | Disposition: E | Payer: Medicare Other | Attending: Pulmonary Disease | Admitting: Pulmonary Disease

## 2022-06-07 DIAGNOSIS — A419 Sepsis, unspecified organism: Principal | ICD-10-CM | POA: Diagnosis present

## 2022-06-07 DIAGNOSIS — Z91199 Patient's noncompliance with other medical treatment and regimen due to unspecified reason: Secondary | ICD-10-CM

## 2022-06-07 DIAGNOSIS — E871 Hypo-osmolality and hyponatremia: Secondary | ICD-10-CM | POA: Diagnosis not present

## 2022-06-07 DIAGNOSIS — J121 Respiratory syncytial virus pneumonia: Secondary | ICD-10-CM | POA: Diagnosis present

## 2022-06-07 DIAGNOSIS — I2510 Atherosclerotic heart disease of native coronary artery without angina pectoris: Secondary | ICD-10-CM | POA: Diagnosis present

## 2022-06-07 DIAGNOSIS — Z888 Allergy status to other drugs, medicaments and biological substances status: Secondary | ICD-10-CM

## 2022-06-07 DIAGNOSIS — I5023 Acute on chronic systolic (congestive) heart failure: Secondary | ICD-10-CM | POA: Diagnosis not present

## 2022-06-07 DIAGNOSIS — Y830 Surgical operation with transplant of whole organ as the cause of abnormal reaction of the patient, or of later complication, without mention of misadventure at the time of the procedure: Secondary | ICD-10-CM | POA: Diagnosis present

## 2022-06-07 DIAGNOSIS — J9600 Acute respiratory failure, unspecified whether with hypoxia or hypercapnia: Secondary | ICD-10-CM | POA: Diagnosis not present

## 2022-06-07 DIAGNOSIS — F429 Obsessive-compulsive disorder, unspecified: Secondary | ICD-10-CM | POA: Diagnosis present

## 2022-06-07 DIAGNOSIS — R112 Nausea with vomiting, unspecified: Secondary | ICD-10-CM | POA: Diagnosis not present

## 2022-06-07 DIAGNOSIS — I272 Pulmonary hypertension, unspecified: Secondary | ICD-10-CM | POA: Diagnosis present

## 2022-06-07 DIAGNOSIS — J9601 Acute respiratory failure with hypoxia: Secondary | ICD-10-CM

## 2022-06-07 DIAGNOSIS — Z66 Do not resuscitate: Secondary | ICD-10-CM | POA: Diagnosis not present

## 2022-06-07 DIAGNOSIS — I350 Nonrheumatic aortic (valve) stenosis: Secondary | ICD-10-CM | POA: Diagnosis not present

## 2022-06-07 DIAGNOSIS — Z681 Body mass index (BMI) 19 or less, adult: Secondary | ICD-10-CM

## 2022-06-07 DIAGNOSIS — Z940 Kidney transplant status: Secondary | ICD-10-CM | POA: Diagnosis not present

## 2022-06-07 DIAGNOSIS — D631 Anemia in chronic kidney disease: Secondary | ICD-10-CM | POA: Diagnosis present

## 2022-06-07 DIAGNOSIS — R197 Diarrhea, unspecified: Secondary | ICD-10-CM | POA: Diagnosis not present

## 2022-06-07 DIAGNOSIS — I130 Hypertensive heart and chronic kidney disease with heart failure and stage 1 through stage 4 chronic kidney disease, or unspecified chronic kidney disease: Secondary | ICD-10-CM | POA: Diagnosis present

## 2022-06-07 DIAGNOSIS — J101 Influenza due to other identified influenza virus with other respiratory manifestations: Secondary | ICD-10-CM | POA: Diagnosis not present

## 2022-06-07 DIAGNOSIS — J189 Pneumonia, unspecified organism: Secondary | ICD-10-CM | POA: Diagnosis not present

## 2022-06-07 DIAGNOSIS — E8721 Acute metabolic acidosis: Secondary | ICD-10-CM | POA: Diagnosis present

## 2022-06-07 DIAGNOSIS — N179 Acute kidney failure, unspecified: Secondary | ICD-10-CM | POA: Diagnosis not present

## 2022-06-07 DIAGNOSIS — I5021 Acute systolic (congestive) heart failure: Secondary | ICD-10-CM | POA: Diagnosis not present

## 2022-06-07 DIAGNOSIS — Z1152 Encounter for screening for COVID-19: Secondary | ICD-10-CM | POA: Diagnosis not present

## 2022-06-07 DIAGNOSIS — E1122 Type 2 diabetes mellitus with diabetic chronic kidney disease: Secondary | ICD-10-CM | POA: Diagnosis present

## 2022-06-07 DIAGNOSIS — I5020 Unspecified systolic (congestive) heart failure: Secondary | ICD-10-CM

## 2022-06-07 DIAGNOSIS — Z20828 Contact with and (suspected) exposure to other viral communicable diseases: Secondary | ICD-10-CM | POA: Diagnosis present

## 2022-06-07 DIAGNOSIS — R918 Other nonspecific abnormal finding of lung field: Secondary | ICD-10-CM | POA: Diagnosis not present

## 2022-06-07 DIAGNOSIS — R0902 Hypoxemia: Secondary | ICD-10-CM

## 2022-06-07 DIAGNOSIS — I447 Left bundle-branch block, unspecified: Secondary | ICD-10-CM | POA: Diagnosis not present

## 2022-06-07 DIAGNOSIS — D849 Immunodeficiency, unspecified: Secondary | ICD-10-CM | POA: Diagnosis present

## 2022-06-07 DIAGNOSIS — I21A1 Myocardial infarction type 2: Secondary | ICD-10-CM | POA: Diagnosis present

## 2022-06-07 DIAGNOSIS — M179 Osteoarthritis of knee, unspecified: Secondary | ICD-10-CM | POA: Diagnosis not present

## 2022-06-07 DIAGNOSIS — R739 Hyperglycemia, unspecified: Secondary | ICD-10-CM | POA: Diagnosis not present

## 2022-06-07 DIAGNOSIS — R634 Abnormal weight loss: Secondary | ICD-10-CM | POA: Diagnosis not present

## 2022-06-07 DIAGNOSIS — E1165 Type 2 diabetes mellitus with hyperglycemia: Secondary | ICD-10-CM

## 2022-06-07 DIAGNOSIS — T8619 Other complication of kidney transplant: Secondary | ICD-10-CM | POA: Diagnosis not present

## 2022-06-07 DIAGNOSIS — I959 Hypotension, unspecified: Secondary | ICD-10-CM | POA: Diagnosis not present

## 2022-06-07 DIAGNOSIS — R34 Anuria and oliguria: Secondary | ICD-10-CM | POA: Diagnosis not present

## 2022-06-07 DIAGNOSIS — D84821 Immunodeficiency due to drugs: Secondary | ICD-10-CM | POA: Diagnosis present

## 2022-06-07 DIAGNOSIS — Z79899 Other long term (current) drug therapy: Secondary | ICD-10-CM

## 2022-06-07 DIAGNOSIS — I480 Paroxysmal atrial fibrillation: Secondary | ICD-10-CM | POA: Diagnosis present

## 2022-06-07 DIAGNOSIS — F419 Anxiety disorder, unspecified: Secondary | ICD-10-CM | POA: Diagnosis present

## 2022-06-07 DIAGNOSIS — R636 Underweight: Secondary | ICD-10-CM | POA: Diagnosis present

## 2022-06-07 DIAGNOSIS — F32A Depression, unspecified: Secondary | ICD-10-CM | POA: Diagnosis present

## 2022-06-07 DIAGNOSIS — K219 Gastro-esophageal reflux disease without esophagitis: Secondary | ICD-10-CM | POA: Diagnosis present

## 2022-06-07 DIAGNOSIS — N1832 Chronic kidney disease, stage 3b: Secondary | ICD-10-CM | POA: Diagnosis not present

## 2022-06-07 DIAGNOSIS — E11319 Type 2 diabetes mellitus with unspecified diabetic retinopathy without macular edema: Secondary | ICD-10-CM | POA: Diagnosis present

## 2022-06-07 DIAGNOSIS — Z7960 Long term (current) use of unspecified immunomodulators and immunosuppressants: Secondary | ICD-10-CM

## 2022-06-07 DIAGNOSIS — E872 Acidosis, unspecified: Secondary | ICD-10-CM

## 2022-06-07 DIAGNOSIS — R6521 Severe sepsis with septic shock: Secondary | ICD-10-CM | POA: Diagnosis present

## 2022-06-07 DIAGNOSIS — R4189 Other symptoms and signs involving cognitive functions and awareness: Secondary | ICD-10-CM | POA: Diagnosis present

## 2022-06-07 DIAGNOSIS — I700 Atherosclerosis of aorta: Secondary | ICD-10-CM | POA: Diagnosis present

## 2022-06-07 DIAGNOSIS — Z515 Encounter for palliative care: Secondary | ICD-10-CM | POA: Diagnosis not present

## 2022-06-07 DIAGNOSIS — Z8249 Family history of ischemic heart disease and other diseases of the circulatory system: Secondary | ICD-10-CM

## 2022-06-07 DIAGNOSIS — G9341 Metabolic encephalopathy: Secondary | ICD-10-CM | POA: Diagnosis present

## 2022-06-07 DIAGNOSIS — I214 Non-ST elevation (NSTEMI) myocardial infarction: Secondary | ICD-10-CM

## 2022-06-07 DIAGNOSIS — R059 Cough, unspecified: Secondary | ICD-10-CM | POA: Diagnosis present

## 2022-06-07 DIAGNOSIS — N186 End stage renal disease: Secondary | ICD-10-CM | POA: Diagnosis not present

## 2022-06-07 DIAGNOSIS — R570 Cardiogenic shock: Secondary | ICD-10-CM | POA: Diagnosis present

## 2022-06-07 DIAGNOSIS — R1111 Vomiting without nausea: Secondary | ICD-10-CM | POA: Diagnosis not present

## 2022-06-07 DIAGNOSIS — N200 Calculus of kidney: Secondary | ICD-10-CM | POA: Diagnosis not present

## 2022-06-07 DIAGNOSIS — Z4682 Encounter for fitting and adjustment of non-vascular catheter: Secondary | ICD-10-CM | POA: Diagnosis not present

## 2022-06-07 DIAGNOSIS — N189 Chronic kidney disease, unspecified: Secondary | ICD-10-CM | POA: Diagnosis present

## 2022-06-07 DIAGNOSIS — Z833 Family history of diabetes mellitus: Secondary | ICD-10-CM

## 2022-06-07 DIAGNOSIS — I219 Acute myocardial infarction, unspecified: Secondary | ICD-10-CM | POA: Diagnosis not present

## 2022-06-07 DIAGNOSIS — I358 Other nonrheumatic aortic valve disorders: Secondary | ICD-10-CM | POA: Diagnosis not present

## 2022-06-07 DIAGNOSIS — B338 Other specified viral diseases: Principal | ICD-10-CM

## 2022-06-07 DIAGNOSIS — J9690 Respiratory failure, unspecified, unspecified whether with hypoxia or hypercapnia: Secondary | ICD-10-CM | POA: Diagnosis not present

## 2022-06-07 DIAGNOSIS — R6883 Chills (without fever): Secondary | ICD-10-CM | POA: Diagnosis not present

## 2022-06-07 DIAGNOSIS — F0390 Unspecified dementia without behavioral disturbance: Secondary | ICD-10-CM | POA: Insufficient documentation

## 2022-06-07 DIAGNOSIS — I10 Essential (primary) hypertension: Secondary | ICD-10-CM | POA: Diagnosis present

## 2022-06-07 DIAGNOSIS — J479 Bronchiectasis, uncomplicated: Secondary | ICD-10-CM | POA: Diagnosis not present

## 2022-06-07 DIAGNOSIS — R579 Shock, unspecified: Secondary | ICD-10-CM | POA: Diagnosis not present

## 2022-06-07 DIAGNOSIS — M1990 Unspecified osteoarthritis, unspecified site: Secondary | ICD-10-CM | POA: Diagnosis present

## 2022-06-07 DIAGNOSIS — Z91040 Latex allergy status: Secondary | ICD-10-CM

## 2022-06-07 DIAGNOSIS — Z794 Long term (current) use of insulin: Secondary | ICD-10-CM

## 2022-06-07 DIAGNOSIS — J90 Pleural effusion, not elsewhere classified: Secondary | ICD-10-CM | POA: Diagnosis not present

## 2022-06-07 LAB — BASIC METABOLIC PANEL
Anion gap: 12 (ref 5–15)
BUN: 38 mg/dL — ABNORMAL HIGH (ref 8–23)
CO2: 16 mmol/L — ABNORMAL LOW (ref 22–32)
Calcium: 7.6 mg/dL — ABNORMAL LOW (ref 8.9–10.3)
Chloride: 101 mmol/L (ref 98–111)
Creatinine, Ser: 1.48 mg/dL — ABNORMAL HIGH (ref 0.44–1.00)
GFR, Estimated: 36 mL/min — ABNORMAL LOW (ref >60)
Glucose, Bld: 335 mg/dL — ABNORMAL HIGH (ref 70–99)
Potassium: 4.4 mmol/L (ref 3.5–5.1)
Sodium: 129 mmol/L — ABNORMAL LOW (ref 135–145)

## 2022-06-07 LAB — COMPREHENSIVE METABOLIC PANEL
ALT: 13 U/L (ref 0–44)
AST: 23 U/L (ref 15–41)
Albumin: 3.4 g/dL — ABNORMAL LOW (ref 3.5–5.0)
Alkaline Phosphatase: 63 U/L (ref 38–126)
Anion gap: 12 (ref 5–15)
BUN: 34 mg/dL — ABNORMAL HIGH (ref 8–23)
CO2: 20 mmol/L — ABNORMAL LOW (ref 22–32)
Calcium: 8.5 mg/dL — ABNORMAL LOW (ref 8.9–10.3)
Chloride: 97 mmol/L — ABNORMAL LOW (ref 98–111)
Creatinine, Ser: 1.49 mg/dL — ABNORMAL HIGH (ref 0.44–1.00)
GFR, Estimated: 36 mL/min — ABNORMAL LOW (ref >60)
Glucose, Bld: 293 mg/dL — ABNORMAL HIGH (ref 70–99)
Potassium: 4.4 mmol/L (ref 3.5–5.1)
Sodium: 129 mmol/L — ABNORMAL LOW (ref 135–145)
Total Bilirubin: 1.1 mg/dL (ref 0.3–1.2)
Total Protein: 7.1 g/dL (ref 6.5–8.1)

## 2022-06-07 LAB — CBC WITH DIFFERENTIAL/PLATELET
Abs Immature Granulocytes: 0.02 10*3/uL (ref 0.00–0.07)
Basophils Absolute: 0.1 10*3/uL (ref 0.0–0.1)
Basophils Relative: 1 %
Eosinophils Absolute: 0 10*3/uL (ref 0.0–0.5)
Eosinophils Relative: 1 %
HCT: 32.4 % — ABNORMAL LOW (ref 36.0–46.0)
Hemoglobin: 10.4 g/dL — ABNORMAL LOW (ref 12.0–15.0)
Immature Granulocytes: 0 %
Lymphocytes Relative: 18 %
Lymphs Abs: 1.3 10*3/uL (ref 0.7–4.0)
MCH: 27.8 pg (ref 26.0–34.0)
MCHC: 32.1 g/dL (ref 30.0–36.0)
MCV: 86.6 fL (ref 80.0–100.0)
Monocytes Absolute: 0.8 10*3/uL (ref 0.1–1.0)
Monocytes Relative: 11 %
Neutro Abs: 5 10*3/uL (ref 1.7–7.7)
Neutrophils Relative %: 69 %
Platelets: 151 10*3/uL (ref 150–400)
RBC: 3.74 MIL/uL — ABNORMAL LOW (ref 3.87–5.11)
RDW: 13.1 % (ref 11.5–15.5)
WBC: 7.3 10*3/uL (ref 4.0–10.5)
nRBC: 0 % (ref 0.0–0.2)

## 2022-06-07 LAB — RESP PANEL BY RT-PCR (RSV, FLU A&B, COVID)  RVPGX2
Influenza A by PCR: NEGATIVE
Influenza B by PCR: NEGATIVE
Resp Syncytial Virus by PCR: POSITIVE — AB
SARS Coronavirus 2 by RT PCR: NEGATIVE

## 2022-06-07 LAB — LIPASE, BLOOD: Lipase: 27 U/L (ref 11–51)

## 2022-06-07 LAB — PROCALCITONIN: Procalcitonin: 1.43 ng/mL

## 2022-06-07 MED ORDER — ACETAMINOPHEN 650 MG RE SUPP: 650.0000 mg | Freq: Four times a day (QID) | RECTAL | Status: DC | PRN

## 2022-06-07 MED ORDER — BRIMONIDINE TARTRATE 0.2 % OP SOLN: 1.0000 [drp] | Freq: Two times a day (BID) | OPHTHALMIC | Status: DC

## 2022-06-07 MED ORDER — SODIUM CHLORIDE 0.9 % IV SOLN
500.0000 mg | Freq: Once | INTRAVENOUS | Status: AC
  Administered 2022-06-07: 500 mg via INTRAVENOUS
  Filled 2022-06-07: qty 5

## 2022-06-07 MED ORDER — ENOXAPARIN SODIUM 30 MG/0.3ML IJ SOSY
30.0000 mg | PREFILLED_SYRINGE | INTRAMUSCULAR | Status: DC
  Administered 2022-06-08 – 2022-06-12 (×6): 30 mg via SUBCUTANEOUS
  Filled 2022-06-07 (×5): qty 0.3

## 2022-06-07 MED ORDER — AMLODIPINE BESYLATE 10 MG PO TABS
10.0000 mg | ORAL_TABLET | Freq: Every day | ORAL | Status: DC
  Administered 2022-06-08 – 2022-06-09 (×3): 10 mg via ORAL
  Filled 2022-06-07: qty 1
  Filled 2022-06-07: qty 2
  Filled 2022-06-07: qty 1

## 2022-06-07 MED ORDER — METOPROLOL TARTRATE 50 MG PO TABS
50.0000 mg | ORAL_TABLET | Freq: Two times a day (BID) | ORAL | Status: DC
  Administered 2022-06-08 – 2022-06-09 (×5): 50 mg via ORAL
  Filled 2022-06-07 (×2): qty 2
  Filled 2022-06-07 (×5): qty 1

## 2022-06-07 MED ORDER — ONDANSETRON HCL 4 MG/2ML IJ SOLN
4.0000 mg | Freq: Four times a day (QID) | INTRAMUSCULAR | Status: DC | PRN
  Administered 2022-06-10: 4 mg via INTRAVENOUS
  Filled 2022-06-07: qty 2

## 2022-06-07 MED ORDER — FLUOXETINE HCL 20 MG PO CAPS
40.0000 mg | ORAL_CAPSULE | Freq: Every morning | ORAL | Status: DC
  Administered 2022-06-08 – 2022-06-14 (×6): 40 mg via ORAL
  Filled 2022-06-07 (×7): qty 2

## 2022-06-07 MED ORDER — IOHEXOL 300 MG/ML  SOLN
65.0000 mL | Freq: Once | INTRAMUSCULAR | Status: AC | PRN
  Administered 2022-06-07: 65 mL via INTRAVENOUS

## 2022-06-07 MED ORDER — SODIUM CHLORIDE 0.9 % IV BOLUS
1000.0000 mL | Freq: Once | INTRAVENOUS | Status: AC
  Administered 2022-06-07: 1000 mL via INTRAVENOUS

## 2022-06-07 MED ORDER — PANTOPRAZOLE SODIUM 40 MG PO TBEC
40.0000 mg | DELAYED_RELEASE_TABLET | Freq: Every day | ORAL | Status: DC
  Administered 2022-06-08 – 2022-06-13 (×3): 40 mg via ORAL
  Filled 2022-06-07 (×6): qty 1

## 2022-06-07 MED ORDER — HYDROCODONE-ACETAMINOPHEN 5-325 MG PO TABS
1.0000 | ORAL_TABLET | ORAL | Status: DC | PRN
  Filled 2022-06-07: qty 1

## 2022-06-07 MED ORDER — ONDANSETRON HCL 4 MG PO TABS
4.0000 mg | ORAL_TABLET | Freq: Four times a day (QID) | ORAL | Status: DC | PRN
  Administered 2022-06-09: 4 mg via ORAL
  Filled 2022-06-07: qty 1

## 2022-06-07 MED ORDER — ONDANSETRON HCL 4 MG/2ML IJ SOLN
4.0000 mg | Freq: Once | INTRAMUSCULAR | Status: AC
  Administered 2022-06-07: 4 mg via INTRAVENOUS
  Filled 2022-06-07: qty 2

## 2022-06-07 MED ORDER — ALBUTEROL SULFATE (2.5 MG/3ML) 0.083% IN NEBU
2.5000 mg | INHALATION_SOLUTION | Freq: Four times a day (QID) | RESPIRATORY_TRACT | Status: DC
  Administered 2022-06-08 (×4): 2.5 mg via RESPIRATORY_TRACT
  Filled 2022-06-07 (×4): qty 3

## 2022-06-07 MED ORDER — LACTATED RINGERS IV SOLN: INTRAVENOUS | Status: DC

## 2022-06-07 MED ORDER — ATORVASTATIN CALCIUM 20 MG PO TABS
20.0000 mg | ORAL_TABLET | Freq: Every day | ORAL | Status: DC
  Administered 2022-06-08 – 2022-06-14 (×6): 20 mg via ORAL
  Filled 2022-06-07 (×7): qty 1

## 2022-06-07 MED ORDER — SODIUM CHLORIDE 0.9 % IV SOLN: INTRAVENOUS | Status: DC

## 2022-06-07 MED ORDER — SODIUM CHLORIDE 0.9 % IV SOLN
1.0000 g | Freq: Once | INTRAVENOUS | Status: AC
  Administered 2022-06-07: 1 g via INTRAVENOUS
  Filled 2022-06-07: qty 10

## 2022-06-07 MED ORDER — ACETAMINOPHEN 325 MG PO TABS: 650.0000 mg | ORAL_TABLET | Freq: Four times a day (QID) | ORAL | Status: DC | PRN

## 2022-06-07 MED ORDER — LORAZEPAM 2 MG/ML IJ SOLN
1.0000 mg | Freq: Once | INTRAMUSCULAR | Status: AC
  Administered 2022-06-07: 1 mg via INTRAVENOUS
  Filled 2022-06-07: qty 1

## 2022-06-07 MED ORDER — GUAIFENESIN-DM 100-10 MG/5ML PO SYRP: 10.0000 mL | ORAL_SOLUTION | ORAL | Status: DC | PRN

## 2022-06-07 MED ORDER — INSULIN GLARGINE 100 UNIT/ML ~~LOC~~ SOLN
9.0000 [IU] | Freq: Every day | SUBCUTANEOUS | Status: DC
  Filled 2022-06-07: qty 0.09

## 2022-06-07 NOTE — Assessment & Plan Note (Signed)
BP controlled.  Continue amlodipine and metoprolol ejection

## 2022-06-07 NOTE — Assessment & Plan Note (Signed)
Suspect secondary to dehydration given poor oral intake over the past couple weeks IV hydration and monitor

## 2022-06-07 NOTE — Assessment & Plan Note (Addendum)
Blood sugar over 300 Continue home basal insulin with sliding scale insulin Continue statin

## 2022-06-07 NOTE — ED Notes (Signed)
Patient opened door to room and was asking where the bathroom is located. She was visibly unsteady and weak. She was sat down and then placed in a wheel chair where she was taken to the restroom. She was assisted in the restroom. When she was returned to the room, she was placed on the cardiac monitor by this nurse and found to have O2 saturations 88%. 4L [place] was placed and the patients O2 increased to 93-94%. MD [place] notified.

## 2022-06-07 NOTE — H&P (Signed)
History and Physical    PatientMarland Kitchen EURA Cummings Cummings:[id number] DOB: 01-09-1946 DOA: 07/01/2022 DOS: the patient was seen and examined on 06/30/2022 PCP: Jasmine Bowen, MD  Patient coming from: Home  Chief Complaint: No chief complaint on file.   HPI: Jasmine Cummings is a 76 y.o. female with medical history significant for Kidney transplant in 2013 on immunosuppressants, apparently last seen by transplant in July 2020, mild cognitive deficits/dementia seen by neurology 04/2022, paroxysmal A-fib, not on anticoagulation and who missed last cardiology visit on 11/27, and history of insulin-dependent type 2 diabetes, whoPresents to the ED by EMS with a 2-week history of body aches, chills, cough and nausea with a history of exposure to RSV and flu by grandson.  She reports decreased oral intake due to nausea and weight loss.  Denies fever or chills. ED course and data review: BP 148/59, temp 99 and pulse 68.  O2 sat initially 95% desaturating to 88% requiring 4 L O2 to maintain sats of 94%.  Labs significant for creatinine of 1.48 with last creatinine on record 0.86 3 years ago, bicarb 16 with normal anion gap..  Sodium 129.  CBC with normal WBC of 7300 and hemoglobin 10.4.  Blood glucose 335.  RSV positive, COVID and flu negative.  Lipase and LFTs WNL.  Procalcitonin pending. CT chest abdomen and pelvis favoring multifocal pneumonia and showing RLQ transplant kidney with a nonobstructing renal calculus of 7 mm, details as follows: IMPRESSION: 1. Small bilateral pleural effusions. 2. Patchy areas of airspace disease as above, most pronounced within the lower lobes and right upper lobe. Favor multifocal pneumonia given clinical presentation. Continued follow up after appropriate medical management is recommended to document resolution. 3. Right lower quadrant transplant kidney, with nonobstructing renal calculi measuring up to 7 mm. 4. Calcification of the aortic and mitral valves. 5. Aortic  Atherosclerosis (ICD10-I70.0). Coronary artery atherosclerosis.  Patient was given an IV fluid bolus and also started on Rocephin and azithromycin.  Hospitalist consulted for admission.   Review of Systems: As mentioned in the history of present illness. All other systems reviewed and are negative.  Past Medical History:  Diagnosis Date   Anemia    Anxiety    Arthritis    Chronic kidney disease    Depression    Diabetes mellitus    Diabetic retinopathy ([place])    GERD (gastroesophageal reflux disease)    Hypertension    Memory impairment    Obsessive compulsive disorder    Osteopenia 03/2018   T score -1.9 FRAX 9.7% / 2.1%   Thrombocytopenia ([place]) 05/31/2015   Past Surgical History:  Procedure Laterality Date   APPENDECTOMY     BREAST EXCISIONAL BIOPSY Bilateral    CATARACT EXTRACTION     CESAREAN SECTION  1968   KIDNEY TRANSPLANT Right 09/2011   RETINAL DETACHMENT SURGERY Left 2006   TUBAL LIGATION  1981   Social History:  reports that she has never smoked. She has never used smokeless tobacco. She reports that she does not drink alcohol and does not use drugs.  Allergies  Allergen Reactions   Latex Hives    REACTION: swelling Other reaction(s): Unknown   Other Other (See Comments)    [DERM]   Silicone     Other reaction(s): Other (See Comments) [DERM]    Family History  Problem Relation Age of Onset   Stroke Mother    Heart disease Sister    Breast cancer Sister 45   Heart disease Brother  Diabetes Brother    Diabetes Brother     Prior to Admission medications   Medication Sig Start Date End Date Taking? Authorizing Provider  amLODipine (NORVASC) 10 MG tablet Take 10 mg by mouth at bedtime.   Yes [provider]  atorvastatin (LIPITOR) 20 MG tablet Take 20 mg by mouth daily. 04/26/22  Yes [provider]  brimonidine (ALPHAGAN) 0.2 % ophthalmic solution Place 1 drop into the right eye 2 (two) times daily. 07/20/21  Yes [provider]  FLUoxetine (PROZAC) 40 MG capsule Take 40 mg by mouth every morning.    Yes [provider]  insulin aspart (NOVOLOG) 100 UNIT/ML injection Inject 12 Units into the skin 4 (four) times daily. Sliding Scale   Yes [provider]  Insulin Glargine (LANTUS [place]) Inject 9 Units into the skin daily. 9 units in AM   Yes [provider]  metoprolol tartrate (LOPRESSOR) 25 MG tablet Take 50 mg by mouth 2 (two) times daily.  01/30/13  Yes [provider]  Multiple Vitamin (MULTIVITAMIN) capsule Take 1 capsule by mouth daily.     Yes [provider]  mycophenolate (MYFORTIC) 180 MG EC tablet Take 180-360 mg by mouth 2 (two) times daily. Takes 2 in the morning and 1 at night   Yes [provider]  omeprazole (PRILOSEC) 20 MG capsule Take 20 mg by mouth daily. 01/16/13  Yes [provider]  tacrolimus (PROGRAF) 1 MG capsule Take 1-2 mg by mouth 2 (two) times daily. Takes 2 in the morning and 1 at night   Yes [provider]  zinc gluconate 50 MG tablet Take 50 mg by mouth daily.   Yes [provider]  ALPRAZolam Duanne Moron) 0.25 MG tablet one po bid Oral    [provider]  Continuous Blood Gluc Sensor (FREESTYLE LIBRE SENSOR SYSTEM) MISC FreeStyle Libre 14 Day Sensor kit  CHANGE EVERY 14 DAYS DX E10.29    [provider]  NOVOFINE 32G X 6 MM MISC as needed. Reported on 01/06/2016 03/11/13   [provider]    Physical Exam: Vitals:   06/04/2022 1537 06/17/2022 1913 06/06/2022 2130 06/02/2022 2200  BP: (!) 157/56 (!) 148/59 137/61 122/62  Pulse: 65 68 63 62  Resp: 18 17    Temp: 98.6 F (37 C) 99 F (37.2 C)    TempSrc: Oral Oral    SpO2: 94% 95% 94% 95%  Weight:      Height:       Physical Exam  Labs on Admission: I have personally reviewed following labs and imaging studies  CBC: Recent Labs  Lab 06/06/2022 1213  WBC 7.3  NEUTROABS 5.0  HGB 10.4*  HCT 32.4*  MCV 86.6  PLT 034    Basic Metabolic Panel: Recent Labs  Lab 06/30/2022 1213 06/24/2022 2012  NA 129* 129*  K 4.4 4.4  CL 97* 101  CO2 20* 16*  GLUCOSE 293* 335*  BUN 34* 38*  CREATININE 1.49* 1.48*  CALCIUM 8.5* 7.6*   GFR: Estimated Creatinine Clearance: 26.7 mL/min (A) (by C-G formula based on SCr of 1.48 mg/dL (H)). Liver Function Tests: Recent Labs  Lab 06/30/2022 1213  AST 23  ALT 13  ALKPHOS 63  BILITOT 1.1  PROT 7.1  ALBUMIN 3.4*   Recent Labs  Lab 06/19/2022 1213  LIPASE 27   No results for input(s): "AMMONIA" in the last 168 hours. Coagulation Profile: No results for input(s): "INR", "PROTIME" in the last 168 hours.  Cardiac Enzymes: No results for input(s): "CKTOTAL", "CKMB", "CKMBINDEX", "TROPONINI" in the last 168 hours. BNP (last 3 results) No results for input(s): "PROBNP" in the last 8760 hours. HbA1C: No results for input(s): "HGBA1C" in the last 72 hours. CBG: No results for input(s): "GLUCAP" in the last 168 hours. Lipid Profile: No results for input(s): "CHOL", "HDL", "LDLCALC", "TRIG", "CHOLHDL", "LDLDIRECT" in the last 72 hours. Thyroid Function Tests: No results for input(s): "TSH", "T4TOTAL", "FREET4", "T3FREE", "THYROIDAB" in the last 72 hours. Anemia Panel: No results for input(s): "VITAMINB12", "FOLATE", "FERRITIN", "TIBC", "IRON", "RETICCTPCT" in the last 72 hours. Urine analysis:    Component Value Date/Time   COLORURINE YELLOW (A) 10/30/2018 2052   APPEARANCEUR CLEAR (A) 10/30/2018 2052   APPEARANCEUR Clear 12/10/2013 1802   LABSPEC 1.011 10/30/2018 2052   LABSPEC 1.014 12/10/2013 1802   PHURINE 6.0 10/30/2018 2052   GLUCOSEU 150 (A) 10/30/2018 2052   GLUCOSEU >=500 12/10/2013 1802   HGBUR NEGATIVE 10/30/2018 2052   BILIRUBINUR NEGATIVE 10/30/2018 2052   BILIRUBINUR Negative 12/10/2013 1802   KETONESUR 5 (A) 10/30/2018 2052   PROTEINUR 30 (A) 10/30/2018 2052   NITRITE NEGATIVE 10/30/2018 2052   LEUKOCYTESUR NEGATIVE 10/30/2018 2052   LEUKOCYTESUR  Negative 12/10/2013 1802    Radiological Exams on Admission: CT CHEST ABDOMEN PELVIS W CONTRAST  Result Date: 06/03/2022 CLINICAL DATA:  Nausea, weight loss for several weeks, cough, chills, abnormal chest x-ray EXAM: CT CHEST, ABDOMEN, AND PELVIS WITH CONTRAST TECHNIQUE: Multidetector CT imaging of the chest, abdomen and pelvis was performed following the standard protocol during bolus administration of intravenous contrast. RADIATION DOSE REDUCTION: This exam was performed according to the departmental dose-optimization program which includes automated exposure control, adjustment of the mA and/or kV according to patient size and/or use of iterative reconstruction technique. CONTRAST:  22m OMNIPAQUE IOHEXOL 300 MG/ML  SOLN COMPARISON:  12/10/2013, 06/25/2022 FINDINGS: CT CHEST FINDINGS Cardiovascular: There is dense calcification of the aortic and mitral valves. Otherwise the heart is unremarkable without pericardial effusion. No evidence of thoracic aortic aneurysm or dissection. Atherosclerosis of the aorta and coronary vasculature. Mediastinum/Nodes: No enlarged mediastinal, hilar, or axillary lymph nodes. Thyroid gland, trachea, and esophagus demonstrate no significant findings. Lungs/Pleura: There are small bilateral free-flowing pleural effusions. There is patchy consolidation within the dependent bilateral lower lobes, which could reflect pneumonia or hypoventilatory change. Bronchiectasis and scarring within the right middle lobe and lingular segment left upper lobe. No pneumothorax. The central airways are patent. There is patchy tree in bud nodular airspace disease within the right upper lobe, likely inflammatory or infectious. This nodular consolidation measures 3 mm image 49/4 and 4 mm image 74/4. Musculoskeletal: No acute or destructive bony lesions. Reconstructed images demonstrate no additional findings. CT ABDOMEN PELVIS FINDINGS Hepatobiliary: No focal liver abnormality is seen. No  gallstones, gallbladder wall thickening, or biliary dilatation. Pancreas: Unremarkable. No pancreatic ductal dilatation or surrounding inflammatory changes. Spleen: Normal in size without focal abnormality. Adrenals/Urinary Tract: Marked atrophy of the native kidneys. Right lower quadrant transplant kidney is identified, with nonobstructing calculi measuring up to 7 mm. No hydronephrosis or renal mass. The bladder is unremarkable.  The adrenals are normal. Stomach/Bowel: No bowel obstruction or ileus. The appendix is surgically absent. No bowel wall thickening or inflammatory change. Vascular/Lymphatic: Aortic atherosclerosis. No enlarged abdominal or pelvic lymph nodes. Reproductive: Uterus and bilateral adnexa are unremarkable. Other: No free fluid or free intraperitoneal gas. No abdominal wall hernia. Musculoskeletal: There are no acute or destructive bony lesions. Reconstructed images demonstrate no  additional findings. IMPRESSION: 1. Small bilateral pleural effusions. 2. Patchy areas of airspace disease as above, most pronounced within the lower lobes and right upper lobe. Favor multifocal pneumonia given clinical presentation. Continued follow up after appropriate medical management is recommended to document resolution. 3. Right lower quadrant transplant kidney, with nonobstructing renal calculi measuring up to 7 mm. 4. Calcification of the aortic and mitral valves. 5. Aortic Atherosclerosis (ICD10-I70.0). Coronary artery atherosclerosis. Electronically Signed   By: Randa Ngo M.D.   On: 06/21/2022 18:19   DG Chest 2 View  Result Date: 06/18/2022 CLINICAL DATA:  Cough and chills EXAM: CHEST - 2 VIEW COMPARISON:  None Available. FINDINGS: Cardiac and mediastinal contours are within normal limits. Calcified mediastinal lymph nodes, likely sequela prior granulomatous infection. Lower lung predominant interstitial thickening and nodularity. Small bilateral pleural effusions. No evidence of pneumothorax  IMPRESSION: 1. Lower lung predominant interstitial thickening and nodularity, possibly due to chronic atypical infection, although lymphangitic spread of neoplasm could also have this appearance. Chest CT is recommended for better characterization. 2. Small bilateral pleural effusions. Electronically Signed   By: Yetta Glassman M.D.   On: 06/02/2022 13:15     Data Reviewed: Relevant notes from primary care and specialist visits, past discharge summaries as available in EHR, including Care Everywhere. Prior diagnostic testing as pertinent to current admission diagnoses Updated medications and problem lists for reconciliation ED course, including vitals, labs, imaging, treatment and response to treatment Triage notes, nursing and pharmacy notes and ED provider's notes Notable results as noted in HPI   Assessment and Plan: * Pneumonia due to respiratory syncytial virus (RSV) Acute hypoxic respiratory failure Patient has a 2-week history of body aches chills and cough and nausea with decreased oral intake.  O2 sat 88 in the ED requiring 4 L to maintain his sats in the mid 90s CT chest showing airspace disease bilateral lower lobes and right upper lobe favoring multifocal pneumonia RSV positive Supportive care, IV fluids, antitussives Given Rocephin and azithromycin in the ED Will hold off on further antibiotics pending procalcitonin Supplemental O2 to keep sats above 94  Hyponatremia Suspect secondary to dehydration given poor oral intake over the past couple weeks IV hydration and monitor  AKI (acute kidney injury) ([place]) Metabolic acidosis History of kidney transplant 2013 on chronic immunosuppression Creatinine 1.48 but no recent creatinine found on record review.  Was 0.86 in April 2020 patient last saw transplant in 2020 Suspect AKI given poor oral intake.  Unknown whether underlying CKD given no recent creatinine on record IV hydration and monitor renal function and avoid  nephrotoxins Will hold immunosuppressants tonight tacrolimus and mycophenolate Consider renal consult if renal function not improving or if worsening  Deceased-donor kidney transplant recipient Chronic immunosuppressant  Uncontrolled type 2 diabetes mellitus with hyperglycemia, with long-term current use of insulin ([facility]) Blood sugar over 300 Continue home basal insulin with sliding scale insulin Continue statin  Dementia without behavioral disturbance ([place]) Diagnosed in October 2023 at neurology visit with qualifying Mini-Mental score Not currently on medication  PAF (paroxysmal atrial fibrillation) ([place]) Patient not on any blood thinners Continue metoprolol Chart review reveals missed cardiology appointment on 11/27  Essential hypertension  BP controlled.  Continue amlodipine and metoprolol ejection      DVT prophylaxis: Lovenox  Consults: none  Advance Care Planning: full code  Family Communication: none***  Disposition Plan: Back to previous home environment  Severity of Illness: The appropriate patient status for this patient is INPATIENT. Inpatient status is judged  to be reasonable and necessary in order to provide the required intensity of service to ensure the patient's safety. The patient's presenting symptoms, physical exam findings, and initial radiographic and laboratory data in the context of their chronic comorbidities is felt to place them at high risk for further clinical deterioration. Furthermore, it is not anticipated that the patient will be medically stable for discharge from the hospital within 2 midnights of admission.   * I certify that at the point of admission it is my clinical judgment that the patient will require inpatient hospital care spanning beyond 2 midnights from the point of admission due to high intensity of service, high risk for further deterioration and high frequency of surveillance required.*  Author: Athena Masse, MD 06/25/2022  10:26 PM  For on call review [email or web address].

## 2022-06-07 NOTE — ED Notes (Signed)
Patient provided sandwich tray per request.

## 2022-06-07 NOTE — ED Provider Notes (Signed)
[facility] Provider Note    Event Date/Time   First MD Initiated Contact with Patient 06/17/2022 1726     (approximate)   History   Patient reports was exposed to flu and RSV   HPI  Jasmine Cummings is a 76 y.o. female who complains of 2 weeks of body aches and chills and nonproductive cough.  She has been nauseated she told the nurse for 2 days but told me for several weeks and has been losing weight.      Physical Exam   Triage Vital Signs: ED Triage Vitals  Enc Vitals Group     BP 06/08/2022 1210 132/65     Pulse Rate 06/26/2022 1210 65     Resp 06/03/2022 1214 16     Temp 06/23/2022 1208 98.5 F (36.9 C)     Temp Source 06/28/2022 1208 Oral     SpO2 06/10/2022 1210 92 %     Weight 06/11/2022 1206 117 lb 1 oz (53.1 kg)     Height 06/06/2022 1206 '5\' 7"'$  (1.702 m)     Head Circumference --      Peak Flow --      Pain Score 06/25/2022 1205 0     Pain Loc --      Pain Edu? --      Excl. in [place]? --     Most recent vital signs: Vitals:   06/16/2022 1913 06/29/2022 2130  BP: (!) 148/59 137/61  Pulse: 68 63  Resp: 17   Temp: 99 F (37.2 C)   SpO2: 95% 94%     General: Awake, no distress.  She is very anxious CV:  Good peripheral perfusion.  Heart regular rate and rhythm no audible murmurs Resp:  Normal effort.  Lungs are clear Abd:  No distention.  Soft and nontender no organomegaly Extremities trace edema bilaterally   ED Results / Procedures / Treatments   Labs (all labs ordered are listed, but only abnormal results are displayed) Labs Reviewed  RESP PANEL BY RT-PCR (RSV, FLU A&B, COVID)  RVPGX2 - Abnormal; Notable for the following components:      Result Value   Resp Syncytial Virus by PCR POSITIVE (*)    All other components within normal limits  CBC WITH DIFFERENTIAL/PLATELET - Abnormal; Notable for the following components:   RBC 3.74 (*)    Hemoglobin 10.4 (*)    HCT 32.4 (*)    All other components within normal limits  COMPREHENSIVE  METABOLIC PANEL - Abnormal; Notable for the following components:   Sodium 129 (*)    Chloride 97 (*)    CO2 20 (*)    Glucose, Bld 293 (*)    BUN 34 (*)    Creatinine, Ser 1.49 (*)    Calcium 8.5 (*)    Albumin 3.4 (*)    GFR, Estimated 36 (*)    All other components within normal limits  BASIC METABOLIC PANEL - Abnormal; Notable for the following components:   Sodium 129 (*)    CO2 16 (*)    Glucose, Bld 335 (*)    BUN 38 (*)    Creatinine, Ser 1.48 (*)    Calcium 7.6 (*)    GFR, Estimated 36 (*)    All other components within normal limits  LIPASE, BLOOD  PROCALCITONIN     EKG     RADIOLOGY Chest x-ray shows bilateral lower lobe nodularity.  Radiology feels that this could be metastatic disease and asked  for CT of the chest.  I reviewed the films and agree.  Because of the patient's history of nausea with weight loss I will go ahead and add an abdominal CT as well.  CT chest abdomen pelvis read by radiology reviewed and interpreted by me does not show any intra-abdominal pathology.  There is patchy most multifocal infiltrate in the lungs.  PROCEDURES:  Critical Care performed:   Procedures   MEDICATIONS ORDERED IN ED: Medications  0.9 %  sodium chloride infusion ( Intravenous New Bag/Given 06/09/2022 2140)  azithromycin (ZITHROMAX) 500 mg in sodium chloride 0.9 % 250 mL IVPB (has no administration in time range)  cefTRIAXone (ROCEPHIN) 1 g in sodium chloride 0.9 % 100 mL IVPB (has no administration in time range)  ondansetron (ZOFRAN) injection 4 mg (4 mg Intravenous Given 06/27/2022 1824)  LORazepam (ATIVAN) injection 1 mg (1 mg Intravenous Given 06/18/2022 1824)  iohexol (OMNIPAQUE) 300 MG/ML solution 65 mL (65 mLs Intravenous Contrast Given 06/05/2022 1759)  sodium chloride 0.9 % bolus 1,000 mL (0 mLs Intravenous Stopped 06/05/2022 2011)  sodium chloride 0.9 % bolus 1,000 mL (0 mLs Intravenous Stopped 06/26/2022 2011)     IMPRESSION / MDM / ASSESSMENT AND PLAN / ED COURSE   I reviewed the triage vital signs and the nursing notes. ----------------------------------------- 9:50 PM on 06/22/2022 ----------------------------------------- No change in patient's GFR with fluids.  Patient was doing fine for about an hour after the fluids and then became hypoxic.  She is now on 4 L.  We will admit her treated for pneumonia and RSV pneumonia.  Discussed in detail with hospitalist about possibility of transfer since we have no record of her kidney function since 2020.  Hospitalist feels that we can at least watch her overnight and make sure she is doing well before we send her out.   Patient's presentation is most consistent with acute presentation with potential threat to life or bodily function.  The patient is on the cardiac monitor to evaluate for evidence of arrhythmia and/or significant heart rate changes.  None have been seen      FINAL CLINICAL IMPRESSION(S) / ED DIAGNOSES   Final diagnoses:  RSV infection  Community acquired pneumonia, unspecified laterality  Hypoxia     Rx / DC Orders   ED Discharge Orders     None        Note:  This document was prepared using Dragon voice recognition software and may include unintentional dictation errors.   Nena Polio, MD 06/04/2022 2153

## 2022-06-07 NOTE — Assessment & Plan Note (Addendum)
Acute hypoxic respiratory failure Patient has a 2-week history of body aches chills and cough and nausea with decreased oral intake.  O2 sat 88 in the ED requiring 4 L to maintain his sats in the mid 90s CT chest showing airspace disease bilateral lower lobes and right upper lobe favoring multifocal pneumonia RSV positive Supportive care, IV fluids, antitussives Given Rocephin and azithromycin in the ED Will hold off on further antibiotics pending procalcitonin Supplemental O2 to keep sats above 94

## 2022-06-07 NOTE — Assessment & Plan Note (Deleted)
Chronic immunosuppressant

## 2022-06-07 NOTE — ED Provider Triage Note (Signed)
Emergency Medicine Provider Triage Evaluation Note  Jasmine Cummings, a 76 y.o. female  was evaluated in triage.  Pt complains of bodyaches, chills, and nonproductive cough.  He presents to the ED via EMS from home.  She reports exposure to RSV and flu.  She reports some nausea for the last 2 days and some left-sided pain has been evaluated by her primary provider.  Review of Systems  Positive: Cough, weakness Negative: Vomiting, diarrhea  Physical Exam  BP 132/65 (BP Location: Right Arm)   Pulse 65   Temp 98.5 F (36.9 C) (Oral)   Ht '5\' 7"'$  (1.702 m)   Wt 53.1 kg   SpO2 92%   BMI 18.33 kg/m  Gen:   Awake, no distress  NAD Resp:  Normal effort CTA MSK:   Moves extremities without difficulty  Other:    Medical Decision Making  Medically screening exam initiated at 12:13 PM.  Appropriate orders placed.  Jasmine Cummings was informed that the remainder of the evaluation will be completed by another provider, this initial triage assessment does not replace that evaluation, and the importance of remaining in the ED until their evaluation is complete.  Geriatric patient to the ED for evaluation of cough, congestion, and bodyaches.  Patient also reports exposure to flu and RSV.   [name], PA-C 06/06/2022 1215

## 2022-06-07 NOTE — Assessment & Plan Note (Addendum)
Patient not on any blood thinners Continue metoprolol Chart review reveals missed cardiology appointment on 11/27

## 2022-06-07 NOTE — ED Triage Notes (Addendum)
C/O two weeks of body aches.  Also c/o chills, cough - non productive. Also c/o nausea x 2 days and left sided pain x 1 month. STates exposed to RSV and flu by grandson.    Arrives via [facility] from home.  Vs wnl.  12 lead wnl.  CBG:  360.  Has not taken any medications today.  20 g LFA, received 150 cc NS

## 2022-06-07 NOTE — Assessment & Plan Note (Deleted)
Metabolic acidosis History of kidney transplant 2013 on chronic immunosuppression Creatinine 1.48 but no recent creatinine found on record review.  Was 0.86 in April 2020 patient last saw transplant in 2020 Suspect AKI given poor oral intake.  Unknown whether underlying CKD given no recent creatinine on record IV hydration and monitor renal function and avoid nephrotoxins Will hold immunosuppressants tonight tacrolimus and mycophenolate Consider renal consult if renal function not improving or if worsening

## 2022-06-07 NOTE — Assessment & Plan Note (Signed)
Diagnosed in October 2023 at neurology visit with qualifying Mini-Mental score Not currently on medication

## 2022-06-08 ENCOUNTER — Encounter: Payer: Self-pay | Admitting: Internal Medicine

## 2022-06-08 DIAGNOSIS — J121 Respiratory syncytial virus pneumonia: Secondary | ICD-10-CM | POA: Diagnosis not present

## 2022-06-08 LAB — BASIC METABOLIC PANEL
Anion gap: 13 (ref 5–15)
BUN: 48 mg/dL — ABNORMAL HIGH (ref 8–23)
CO2: 15 mmol/L — ABNORMAL LOW (ref 22–32)
Calcium: 7.9 mg/dL — ABNORMAL LOW (ref 8.9–10.3)
Chloride: 100 mmol/L (ref 98–111)
Creatinine, Ser: 1.81 mg/dL — ABNORMAL HIGH (ref 0.44–1.00)
GFR, Estimated: 28 mL/min — ABNORMAL LOW (ref >60)
Glucose, Bld: 399 mg/dL — ABNORMAL HIGH (ref 70–99)
Potassium: 4.5 mmol/L (ref 3.5–5.1)
Sodium: 128 mmol/L — ABNORMAL LOW (ref 135–145)

## 2022-06-08 LAB — GLUCOSE, CAPILLARY
Glucose-Capillary: 192 mg/dL — ABNORMAL HIGH (ref 70–99)
Glucose-Capillary: 163 mg/dL — ABNORMAL HIGH (ref 70–99)

## 2022-06-08 LAB — CBC
HCT: 31.5 % — ABNORMAL LOW (ref 36.0–46.0)
Hemoglobin: 9.7 g/dL — ABNORMAL LOW (ref 12.0–15.0)
MCH: 27.5 pg (ref 26.0–34.0)
MCHC: 30.8 g/dL (ref 30.0–36.0)
MCV: 89.2 fL (ref 80.0–100.0)
Platelets: 159 10*3/uL (ref 150–400)
RBC: 3.53 MIL/uL — ABNORMAL LOW (ref 3.87–5.11)
RDW: 13.2 % (ref 11.5–15.5)
WBC: 7.3 10*3/uL (ref 4.0–10.5)
nRBC: 0 % (ref 0.0–0.2)

## 2022-06-08 LAB — CBG MONITORING, ED
Glucose-Capillary: 375 mg/dL — ABNORMAL HIGH (ref 70–99)
Glucose-Capillary: 336 mg/dL — ABNORMAL HIGH (ref 70–99)
Glucose-Capillary: 305 mg/dL — ABNORMAL HIGH (ref 70–99)

## 2022-06-08 LAB — HEMOGLOBIN A1C
Hgb A1c MFr Bld: 7.3 % — ABNORMAL HIGH (ref 4.8–5.6)
Mean Plasma Glucose: 163 mg/dL

## 2022-06-08 MED ORDER — AZITHROMYCIN 500 MG PO TABS
500.0000 mg | ORAL_TABLET | Freq: Every day | ORAL | Status: DC
  Administered 2022-06-08 – 2022-06-09 (×2): 500 mg via ORAL
  Filled 2022-06-08 (×3): qty 1

## 2022-06-08 MED ORDER — INSULIN ASPART 100 UNIT/ML IJ SOLN
0.0000 [IU] | Freq: Every day | INTRAMUSCULAR | Status: DC
  Administered 2022-06-08: 5 [IU] via SUBCUTANEOUS
  Administered 2022-06-10: 3 [IU] via SUBCUTANEOUS
  Filled 2022-06-08 (×2): qty 1

## 2022-06-08 MED ORDER — MYCOPHENOLATE SODIUM 180 MG PO TBEC
180.0000 mg | DELAYED_RELEASE_TABLET | Freq: Every day | ORAL | Status: DC
  Administered 2022-06-08 – 2022-06-09 (×2): 180 mg via ORAL
  Filled 2022-06-08 (×7): qty 1

## 2022-06-08 MED ORDER — INSULIN ASPART 100 UNIT/ML IJ SOLN
0.0000 [IU] | Freq: Three times a day (TID) | INTRAMUSCULAR | Status: DC
  Administered 2022-06-08 (×2): 11 [IU] via SUBCUTANEOUS
  Administered 2022-06-08: 3 [IU] via SUBCUTANEOUS
  Administered 2022-06-09: 2 [IU] via SUBCUTANEOUS
  Administered 2022-06-09 – 2022-06-10 (×3): 3 [IU] via SUBCUTANEOUS
  Administered 2022-06-10: 5 [IU] via SUBCUTANEOUS
  Administered 2022-06-11: 3 [IU] via SUBCUTANEOUS
  Administered 2022-06-11: 8 [IU] via SUBCUTANEOUS
  Filled 2022-06-08 (×11): qty 1

## 2022-06-08 MED ORDER — BRIMONIDINE TARTRATE 0.2 % OP SOLN
1.0000 [drp] | Freq: Two times a day (BID) | OPHTHALMIC | Status: DC
  Administered 2022-06-09 – 2022-06-14 (×10): 1 [drp] via OPHTHALMIC
  Filled 2022-06-08 (×2): qty 5

## 2022-06-08 MED ORDER — TACROLIMUS 1 MG PO CAPS
2.0000 mg | ORAL_CAPSULE | Freq: Every day | ORAL | Status: DC
  Administered 2022-06-08 – 2022-06-14 (×6): 2 mg via ORAL
  Filled 2022-06-08 (×7): qty 2

## 2022-06-08 MED ORDER — LACTATED RINGERS IV SOLN: INTRAVENOUS | Status: DC

## 2022-06-08 MED ORDER — SODIUM CHLORIDE 0.9 % IV SOLN
2.0000 g | INTRAVENOUS | Status: DC
  Administered 2022-06-08 – 2022-06-10 (×3): 2 g via INTRAVENOUS
  Filled 2022-06-08: qty 20
  Filled 2022-06-08: qty 2
  Filled 2022-06-08: qty 20

## 2022-06-08 MED ORDER — TACROLIMUS 1 MG PO CAPS
1.0000 mg | ORAL_CAPSULE | Freq: Every day | ORAL | Status: DC
  Administered 2022-06-08 – 2022-06-13 (×4): 1 mg via ORAL
  Filled 2022-06-08 (×6): qty 1

## 2022-06-08 MED ORDER — ALBUTEROL SULFATE (2.5 MG/3ML) 0.083% IN NEBU
2.5000 mg | INHALATION_SOLUTION | Freq: Three times a day (TID) | RESPIRATORY_TRACT | Status: DC
  Administered 2022-06-09 – 2022-06-10 (×3): 2.5 mg via RESPIRATORY_TRACT
  Filled 2022-06-08 (×4): qty 3

## 2022-06-08 MED ORDER — MYCOPHENOLATE SODIUM 180 MG PO TBEC
360.0000 mg | DELAYED_RELEASE_TABLET | Freq: Every day | ORAL | Status: DC
  Administered 2022-06-08 – 2022-06-14 (×3): 360 mg via ORAL
  Filled 2022-06-08 (×7): qty 2

## 2022-06-08 MED ORDER — INSULIN GLARGINE-YFGN 100 UNIT/ML ~~LOC~~ SOLN
9.0000 [IU] | Freq: Every day | SUBCUTANEOUS | Status: DC
  Administered 2022-06-08 – 2022-06-13 (×6): 9 [IU] via SUBCUTANEOUS
  Filled 2022-06-08 (×9): qty 0.09

## 2022-06-08 NOTE — ED Notes (Signed)
Pt used call bell to notify of monitor alarming and that she feels cold and would like hot tea or coffee; VSS; pt appreciative of hot decaf coffee given with simple creamer and without sugar; pt given warm blanket. Pt denies any other needs.

## 2022-06-08 NOTE — ED Notes (Signed)
Informed RN bed assigned 

## 2022-06-08 NOTE — ED Notes (Signed)
Contacted floor RN to see if able to send pt up now since room marked clean. Awaiting reply.

## 2022-06-08 NOTE — Inpatient Diabetes Management (Signed)
Inpatient Diabetes Program Recommendations  AACE/ADA: New Consensus Statement on Inpatient Glycemic Control (2015)  Target Ranges:  Prepandial:   less than 140 mg/dL      Peak postprandial:   less than 180 mg/dL (1-2 hours)      Critically ill patients:  140 - 180 mg/dL   Lab Results  Component Value Date   GLUCAP 336 (H) 06/08/2022   HGBA1C 8.3 (H) 12/11/2013    Review of Glycemic Control  Latest Reference Range & Units 06/08/22 04:59  CO2 22 - 32 mmol/L 15 (L)  Glucose 70 - 99 mg/dL 399 (H)  Anion gap 5 - 15  13  (L): Data is abnormally low (H): Data is abnormally high  Diabetes history: DM Outpatient Diabetes medications:  Lantus 9 units QAM Novolog 12 units TID Current orders for Inpatient glycemic control:  Semglee 9 units QHS Novolog 0-15 units TID and 0-5 units QHS  Inpatient Diabetes Program Recommendations:    Might consider obtaining a beta-hydroxybutyrate.   Per med rec, she administers her basal QAM. Might consider changing to this morning.     Will continue to follow while inpatient.  Thank you, Reche Dixon, MSN, [place] Diabetes Coordinator Inpatient Diabetes Program [phone] (team pager from 8a-5p)

## 2022-06-08 NOTE — ED Notes (Signed)
Pt denies need to urinate; states chronically doesn't urinate that often; states hasn't drank much in last few days; states that her urination is down from her baseline. Pt denies need to have BM currently.

## 2022-06-08 NOTE — Assessment & Plan Note (Addendum)
Metabolic acidosis History of kidney transplant 2013 on chronic immunosuppression Creatinine 1.48 baseline of 1.2-follows at [facility] (not available on Care Everywhere suspect AKI on CKD given poor oral intake.  --CKD stage uncertain at this time IV hydration and monitor renal function and avoid nephrotoxins Will hold immunosuppressants tonight tacrolimus and mycophenolate Consider renal consult if renal function not improving or if worsening

## 2022-06-08 NOTE — Care Management Important Message (Signed)
Important Message  Patient Details  Name: Jasmine Cummings MRN: [id number] Date of Birth: 1946/03/21   Medicare Important Message Given:  N/A - LOS <3 / Initial given by admissions     Dannette Barbara 06/08/2022, 3:08 PM

## 2022-06-08 NOTE — ED Notes (Signed)
Lunch tray and drink given to pt.

## 2022-06-08 NOTE — Progress Notes (Signed)
Anticoagulation monitoring(Lovenox):  76 yo female ordered Lovenox 40 mg Q24h    Filed Weights   06/26/2022 1206  Weight: 53.1 kg (117 lb 1 oz)   BMI 18.3   Lab Results  Component Value Date   CREATININE 1.48 (H) 06/25/2022   CREATININE 1.49 (H) 06/16/2022   CREATININE 0.86 10/30/2018   Estimated Creatinine Clearance: 26.7 mL/min (A) (by C-G formula based on SCr of 1.48 mg/dL (H)). Hemoglobin & Hematocrit     Component Value Date/Time   HGB 10.4 (L) 07/02/2022 1213   HGB 13.3 10/12/2016 0941   HCT 32.4 (L) 06/26/2022 1213   HCT 39.6 10/12/2016 0941     Per Protocol for Patient with estCrcl < 30 ml/min and BMI < 40, will transition to Lovenox 30 mg Q24h.

## 2022-06-08 NOTE — Progress Notes (Signed)
PROGRESS NOTE    Jasmine Cummings  TIW:[id number] DOB: 1945-12-15 DOA: 06/22/2022 PCP: Reynold Bowen, MD  [id number]/[id number]  LOS: 1 day   Brief hospital course:   Assessment & Plan: Jasmine Cummings is a 76 y.o. female with medical history significant for Kidney transplant in 2013 on immunosuppressants, , mild cognitive deficits/dementia seen by neurology 04/2022, paroxysmal A-fib, not on anticoagulation , and history of insulin-dependent type 2 diabetes, whoPresents to the ED by EMS with a 2-week history of body aches, chills, cough and nausea with a history of exposure to RSV and flu by grandson.  She reports decreased oral intake due to nausea and weight loss.   * Pneumonia due to respiratory syncytial virus (RSV) Acute hypoxic respiratory failure Patient has a 2-week history of body aches chills and cough and nausea with decreased oral intake.  O2 sat 88 in the ED requiring 4 L to maintain sats in the mid 90s. CT chest showing airspace disease bilateral lower lobes and right upper lobe favoring multifocal pneumonia RSV positive --Given Rocephin and azithromycin in the ED.  Procal elevated at 1.43. Plan: --cont ceftriaxone and azithromycin to cover empirically for bacterial PNA given elevated procal and pt's immunocompromised state. --Continue supplemental O2 to keep sats >=90%, wean as tolerated  Hyponatremia --pt appeared to have a hx of hyponatremia.  Na didn't improve with IVF. --monitor for now   Presumed AKI Metabolic acidosis --Cr 2.50 on presentation, last Cr 0.86 from April 2020.  Uncertain whether Cr increase is AKI or pt has developed CKD in the interim.   --received 2L IVF f/b MIVF --hold further IVF and encourage oral hydration  History of kidney transplant 2013 on chronic immunosuppression --resume home tacrolimus and mycophenolate --Consider renal consult if renal function not improving or if worsening   Uncontrolled type 2 diabetes mellitus with hyperglycemia, with  long-term current use of insulin (HCC) --cont glargine 9u nightly --ACHS and SSI   Dementia without behavioral disturbance ([place]) Diagnosed in October 2023 at neurology visit with qualifying Mini-Mental score Not currently on medication   PAF (paroxysmal atrial fibrillation) ([place]) Patient not on any blood thinners Continue metoprolol   Essential hypertension  BP controlled.   Continue amlodipine and Lopressor    DVT prophylaxis: Lovenox SQ Code Status: Full code  Family Communication:  Level of care: Med-Surg Dispo:   The patient is from: home Anticipated d/c is to: home Anticipated d/c date is: 1-2 days   Subjective and Interval History:  No more nausea.  No sputum production with cough.     Objective: Vitals:   06/08/22 1430 06/08/22 1445 06/08/22 1500 06/08/22 1552  BP:   (!) 135/59 (!) 142/69  Pulse:  (!) 55 (!) 56 61  Resp: '19 12 17 19  '$ Temp:    97.8 F (36.6 C)  TempSrc:      SpO2: 97%  97% 98%  Weight:      Height:        Intake/Output Summary (Last 24 hours) at 06/08/2022 1817 Last data filed at 06/08/2022 0835 Gross per 24 hour  Intake 3222.92 ml  Output --  Net 3222.92 ml   Filed Weights   06/10/2022 1206  Weight: 53.1 kg    Examination:   Constitutional: NAD, AAOx3 HEENT: conjunctivae and lids normal, EOMI CV: No cyanosis.   RESP: normal respiratory effort Extremities: No effusions, edema in BLE SKIN: warm, dry Neuro: II - XII grossly intact.     Data Reviewed: I have personally reviewed  labs and imaging studies  Time spent: 50 minutes  Enzo Bi, MD [facility] If 7PM-7AM, please contact night-coverage 06/08/2022, 6:17 PM

## 2022-06-09 DIAGNOSIS — J121 Respiratory syncytial virus pneumonia: Secondary | ICD-10-CM | POA: Diagnosis not present

## 2022-06-09 LAB — CBC
HCT: 30.7 % — ABNORMAL LOW (ref 36.0–46.0)
Hemoglobin: 10.2 g/dL — ABNORMAL LOW (ref 12.0–15.0)
MCH: 28 pg (ref 26.0–34.0)
MCHC: 33.2 g/dL (ref 30.0–36.0)
MCV: 84.3 fL (ref 80.0–100.0)
Platelets: 176 10*3/uL (ref 150–400)
RBC: 3.64 MIL/uL — ABNORMAL LOW (ref 3.87–5.11)
RDW: 13.1 % (ref 11.5–15.5)
WBC: 8.6 10*3/uL (ref 4.0–10.5)
nRBC: 0 % (ref 0.0–0.2)

## 2022-06-09 LAB — BASIC METABOLIC PANEL
Anion gap: 8 (ref 5–15)
BUN: 54 mg/dL — ABNORMAL HIGH (ref 8–23)
CO2: 19 mmol/L — ABNORMAL LOW (ref 22–32)
Calcium: 8.4 mg/dL — ABNORMAL LOW (ref 8.9–10.3)
Chloride: 104 mmol/L (ref 98–111)
Creatinine, Ser: 1.97 mg/dL — ABNORMAL HIGH (ref 0.44–1.00)
GFR, Estimated: 26 mL/min — ABNORMAL LOW (ref >60)
Glucose, Bld: 160 mg/dL — ABNORMAL HIGH (ref 70–99)
Potassium: 4 mmol/L (ref 3.5–5.1)
Sodium: 131 mmol/L — ABNORMAL LOW (ref 135–145)

## 2022-06-09 LAB — GLUCOSE, CAPILLARY
Glucose-Capillary: 141 mg/dL — ABNORMAL HIGH (ref 70–99)
Glucose-Capillary: 121 mg/dL — ABNORMAL HIGH (ref 70–99)
Glucose-Capillary: 181 mg/dL — ABNORMAL HIGH (ref 70–99)
Glucose-Capillary: 169 mg/dL — ABNORMAL HIGH (ref 70–99)

## 2022-06-09 LAB — MAGNESIUM: Magnesium: 2 mg/dL (ref 1.7–2.4)

## 2022-06-09 MED ORDER — PROCHLORPERAZINE EDISYLATE 10 MG/2ML IJ SOLN
10.0000 mg | Freq: Once | INTRAMUSCULAR | Status: AC
  Administered 2022-06-09: 10 mg via INTRAVENOUS
  Filled 2022-06-09 (×2): qty 2

## 2022-06-09 MED ORDER — HYDROXYZINE HCL 25 MG PO TABS
25.0000 mg | ORAL_TABLET | Freq: Three times a day (TID) | ORAL | Status: DC | PRN
  Administered 2022-06-09: 25 mg via ORAL
  Filled 2022-06-09 (×2): qty 1

## 2022-06-09 MED ORDER — ADULT MULTIVITAMIN W/MINERALS CH
1.0000 | ORAL_TABLET | Freq: Every day | ORAL | Status: DC
  Administered 2022-06-10 – 2022-06-12 (×2): 1 via ORAL
  Filled 2022-06-09 (×4): qty 1

## 2022-06-09 NOTE — Progress Notes (Signed)
PROGRESS NOTE    Jasmine Cummings  IZT:[id number] DOB: Jul 19, 1945 DOA: 07/02/2022 PCP: Jasmine Bowen, MD  149A/[id number]-AA  LOS: 2 days   Brief hospital course:   Assessment & Plan: Jasmine Cummings is a 76 y.o. female with medical history significant for Kidney transplant in 2013 on immunosuppressants, , mild cognitive deficits/dementia seen by neurology 04/2022, paroxysmal A-fib, not on anticoagulation , and history of insulin-dependent type 2 diabetes, whoPresents to the ED by EMS with a 2-week history of body aches, chills, cough and nausea with a history of exposure to Jasmine Cummings and Jasmine Cummings by grandson.  She reports decreased oral intake due to nausea and weight loss.   * Pneumonia due to respiratory syncytial virus (Jasmine Cummings) Acute hypoxic respiratory failure Patient has a 2-week history of body aches chills and cough and nausea with decreased oral intake.  O2 sat 88 in the ED requiring 4 L to maintain sats in the mid 90s. CT chest showing airspace disease bilateral lower lobes and right upper lobe favoring multifocal pneumonia Jasmine Cummings positive --Given Rocephin and azithromycin in the ED.  Procal elevated at 1.43. Plan: --cont ceftriaxone and azithromycin to cover empirically for bacterial PNA given elevated procal and pt's immunocompromised state. --Continue supplemental O2 to keep sats >=90%, wean as tolerated  Hyponatremia --pt appeared to have a hx of hyponatremia.  Na didn't improve with IVF. --monitor Na   AKI Metabolic acidosis --Cr 3.82 on presentation, last Cr 0.86 from April 2020.  Cr trending up despite IVF given on presentation. --nephrology consult today  History of kidney transplant 2013 on chronic immunosuppression --cont home tacrolimus and mycophenolate --nephrology consult today   Uncontrolled type 2 diabetes mellitus with hyperglycemia, with long-term current use of insulin (HCC) --cont glargine 9u nightly --ACHS and SSI   Dementia without behavioral disturbance  ([place]) Diagnosed in October 2023 at neurology visit with qualifying Mini-Mental score Not currently on medication   PAF (paroxysmal atrial fibrillation) ([place]) Patient not on any blood thinners Continue metoprolol   Essential hypertension  BP controlled.   Continue amlodipine and Jasmine Cummings    DVT prophylaxis: Jasmine Cummings SQ Code Status: Full code  Family Communication:  Level of care: Med-Surg Dispo:   The patient is from: home Anticipated d/c is to: home Anticipated d/c date is: 1-2 days   Subjective and Interval History:  No significant dyspnea, no cough.  Pt reported feeling tired.  Cr trending up, nephrology consult today.   Objective: Vitals:   06/09/22 1000 06/09/22 1348 06/09/22 1438 06/09/22 1513  BP:   (!) 133/90 (!) 134/56  Pulse:   71 76  Resp:   (!) 21   Temp:   99.4 F (37.4 C) 98.5 F (36.9 C)  TempSrc:   Oral   SpO2: 92% 92% 97% 96%  Weight:      Height:        Intake/Output Summary (Last 24 hours) at 06/09/2022 1741 Last data filed at 06/09/2022 0309 Gross per 24 hour  Intake 100 ml  Output --  Net 100 ml   Filed Weights   06/18/2022 1206  Weight: 53.1 kg    Examination:   Constitutional: NAD, AAOx3 HEENT: conjunctivae and lids normal, EOMI CV: No cyanosis.   RESP: normal respiratory effort, on 2L Extremities: mild edema in BLE SKIN: warm, dry Neuro: II - XII grossly intact.     Data Reviewed: I have personally reviewed labs and imaging studies  Time spent: 35 minutes  Jasmine Bi, MD [facility] If 7PM-7AM, please  contact night-coverage 06/09/2022, 5:41 PM

## 2022-06-09 NOTE — Plan of Care (Signed)
  Problem: Metabolic: Goal: Ability to maintain appropriate glucose levels will improve Outcome: Progressing   Problem: Skin Integrity: Goal: Risk for impaired skin integrity will decrease Outcome: Progressing   Problem: Activity: Goal: Risk for activity intolerance will decrease Outcome: Progressing   Problem: Coping: Goal: Level of anxiety will decrease Outcome: Progressing   Problem: Pain Managment: Goal: General experience of comfort will improve Outcome: Progressing   Problem: Safety: Goal: Ability to remain free from injury will improve Outcome: Progressing

## 2022-06-09 NOTE — Progress Notes (Signed)
Nutrition Brief Note  Patient identified on the Malnutrition Screening Tool (MST) Report  Wt Readings from Last 15 Encounters:  06/03/2022 53.1 kg  04/19/22 53.1 kg  03/25/20 53.5 kg  01/21/19 55.8 kg  10/30/18 56.7 kg  04/01/18 54.9 kg  03/15/18 54.9 kg  01/10/18 54.4 kg  01/08/17 54.9 kg  10/12/16 56.7 kg  02/02/16 54.9 kg  01/06/16 54 kg  05/31/15 55.3 kg  11/04/14 57.2 kg  09/28/14 55.8 kg   Pt with medical history significant for Kidney transplant in 2013 on immunosuppressants, , mild cognitive deficits/dementia seen by neurology 04/2022, paroxysmal A-fib, not on anticoagulation , and history of insulin-dependent type 2 diabetes, who presents with a 2-week history of body aches, chills, cough and nausea with a history of exposure to RSV and flu by grandson.  Pt admitted with pneumonia secondary to RSV.   Reviewed I/O's: +873 ml x 24 hours and +3.3 L since admission   Pt reports decreased oral intake for 2 days PTA due to inability to keep foods and liquids down. She explains that her grandson (who lives with her) was recently diagnosed with RSV and the flu as there was an outbreak at his school. Prior to acute illness, pt reports fair appetite but has never been a big eater. She restricts her meat and egg intake secondary to her kidney transplant in 2013 under the direction of her nephrologist.   Per pt, her UBW is around 125# and has never been a "big person". She estimates she has lost over 70 pounds over the past year. Wt has been stable over the past 2 months. More distant wt records reveal wt stability.   Nutrition-Focused physical exam completed. Findings are no fat depletion, mild muscle depletion, and no edema.    Pt declines offer of oral nutrition supplements.   Lab Results  Component Value Date   HGBA1C 7.3 (H) 06/23/2022   PTA DM medications are 9 units insulin glargine daily and 12 units insulin aspart 4 times daily. Pt reports she is very sensitive to insulin  and is "very brittle". She is followed by Dr Forde Dandy of [facility] for endocrinology.   Labs reviewed: CBGS: [id number] (inpatient orders for glycemic control are 0-15 units insulin aspart TID with meal,s 0-5 units insulin aspart daily at bedtime, and 9 units insulin glargine-yfgn daily at bedtime).    Body mass index is 18.33 kg/m. Patient meets criteria for underweight based on current BMI.   Current diet order is Heart Healthy (liberalized to carb modified, patient is consuming approximately n/a% of meals at this time. Labs and medications reviewed.   No nutrition interventions warranted at this time. If nutrition issues arise, please consult RD.   Loistine Chance, RD, LDN, [place] Registered Dietitian II Certified Diabetes Care and Education Specialist Please refer to [facility] for RD and/or RD on-call/weekend/after hours pager

## 2022-06-09 NOTE — Plan of Care (Signed)
  Problem: Education: Goal: Knowledge of General Education information will improve Description: Including pain rating scale, medication(s)/side effects and non-pharmacologic comfort measures Outcome: Progressing   Problem: Health Behavior/Discharge Planning: Goal: Ability to manage health-related needs will improve Outcome: Progressing   Problem: Clinical Measurements: Goal: Diagnostic test results will improve Outcome: Progressing Goal: Respiratory complications will improve Outcome: Progressing Goal: Cardiovascular complication will be avoided Outcome: Progressing   Problem: Activity: Goal: Risk for activity intolerance will decrease Outcome: Progressing   Problem: Nutrition: Goal: Adequate nutrition will be maintained Outcome: Progressing

## 2022-06-09 NOTE — Inpatient Diabetes Management (Signed)
Inpatient Diabetes Program Recommendations  AACE/ADA: New Consensus Statement on Inpatient Glycemic Control   Target Ranges:  Prepandial:   less than 140 mg/dL      Peak postprandial:   less than 180 mg/dL (1-2 hours)      Critically ill patients:  140 - 180 mg/dL    Latest Reference Range & Units 06/08/22 07:39 06/08/22 11:31 06/08/22 16:23 06/08/22 21:13 06/09/22 09:24  Glucose-Capillary 70 - 99 mg/dL 336 (H) 305 (H) 192 (H) 163 (H) 141 (H)   Review of Glycemic Control  Diabetes history: DM Outpatient Diabetes medications: Lantus 9 units QAM, Novolog 12 units TID with meals Current orders for Inpatient glycemic control: Semglee 9 units QHS, Novolog 0-15 units TID with meals, Novolog 0-5 units QHS  Inpatient Diabetes Program Recommendations:    Insulin: Please consider ordering Novolog 3 units TID with meals for meal coverage if patient eats at least 50% of meals.  Thanks, Barnie Alderman, RN, MSN, [place] Diabetes Coordinator Inpatient Diabetes Program [phone] (Team Pager from 8am to [place])

## 2022-06-09 NOTE — Consult Note (Signed)
[facility]  CONSULT NOTE    Date: 06/09/2022                  Patient Name:  Jasmine Cummings  MRN: [id number]  DOB: 04/08/1946  Age / Sex: 76 y.o., female         PCP: South, Stephen, MD                 Service Requesting Consult: TRH                 Reason for Consult: Acute kidney injury            History of Present Illness: Ms. Jasmine Cummings is a 76 y.o.  female with past medical conditions including diabetes type 2, paroxysmal atrial fibrillation, mild cognitive deficits and kidney transplant in 2013, who was admitted to [facility] on 06/26/2022 for Hypoxia [R09.02] RSV infection [B33.8] Pneumonia due to respiratory syncytial virus (RSV) [J12.1] Community acquired pneumonia, unspecified laterality [J18.9]  Patient presents to the emergency department with complaints of chills, body aches, cough and nausea.  Patient states RSV has been present in her family.  She also reports decreased oral intake but states she has maintained all medications.  She states that her type 2 diabetes caused her kidney failure for which she received a cadaveric kidney transplant in 2013 at [facility].  She is currently monitored by [place] nephrologist.  She states she was recently with her grandchildren.  Reports her symptoms started about 2 weeks prior.  Full oral intake, nausea, and vomiting.  These progressed to body aches and chills, unknown if fever present.  Labs on ED arrival significant for sodium 129, serum bicarb 20, glucose 293, BUN 34, creatinine 1.49 with GFR 36, and hemoglobin 10.4.  Respiratory panel negative for influenza and COVID-19 however positive for RSV.  Hemoglobin A1c 7.3.  Chest x-ray shows possible atypical infection.  CT chest shows small bilateral pleural effusions and suspicious for multifocal pneumonia.  Transplanted kidney visualized with 7 mm nonobstructive calculi.   Medications: Outpatient medications: Facility-Administered  Medications Prior to Admission  Medication Dose Route Frequency Provider Last Rate Last Admin   0.9 %  sodium chloride infusion  500 mL Intravenous Once Perry, John N, MD       Medications Prior to Admission  Medication Sig Dispense Refill Last Dose   ALPRAZolam (XANAX) 0.25 MG tablet one po bid Oral   Past Week   amLODipine (NORVASC) 10 MG tablet Take 10 mg by mouth at bedtime.   Past Week   atorvastatin (LIPITOR) 20 MG tablet Take 20 mg by mouth daily.   Past Week   brimonidine (ALPHAGAN) 0.2 % ophthalmic solution Place 1 drop into the right eye 2 (two) times daily.   Past Week   Continuous Blood Gluc Sensor (FREESTYLE LIBRE SENSOR SYSTEM) MISC FreeStyle Libre 14 Day Sensor kit  CHANGE EVERY 14 DAYS DX E10.29   Past Week   FLUoxetine (PROZAC) 40 MG capsule Take 40 mg by mouth every morning.    Past Week   insulin aspart (NOVOLOG) 100 UNIT/ML injection Inject 12 Units into the skin 4 (four) times daily. Sliding Scale   Past Week   Insulin Glargine (LANTUS [place]) Inject 9 Units into the skin daily. 9 units in AM   Past Week   metoprolol tartrate (LOPRESSOR) 25 MG tablet Take 50 mg by mouth 2 (two) times daily.    Past Week   Multiple Vitamin (  MULTIVITAMIN) capsule Take 1 capsule by mouth daily.     Past Week   mycophenolate (MYFORTIC) 180 MG EC tablet Take 180-360 mg by mouth 2 (two) times daily. Takes 2 in the morning and 1 at night   Past Week   NOVOFINE 32G X 6 MM MISC as needed. Reported on 01/06/2016   Past Week   omeprazole (PRILOSEC) 20 MG capsule Take 20 mg by mouth daily.   Past Week   tacrolimus (PROGRAF) 1 MG capsule Take 1-2 mg by mouth 2 (two) times daily. Takes 2 in the morning and 1 at night   Past Week   zinc gluconate 50 MG tablet Take 50 mg by mouth daily.   Past Week    Current medications: Current Facility-Administered Medications  Medication Dose Route Frequency Provider Last Rate Last Admin   acetaminophen (TYLENOL) tablet 650 mg  650 mg Oral Q6H PRN Duncan, Hazel V, MD        Or   acetaminophen (TYLENOL) suppository 650 mg  650 mg Rectal Q6H PRN Duncan, Hazel V, MD       albuterol (PROVENTIL) (2.5 MG/3ML) 0.083% nebulizer solution 2.5 mg  2.5 mg Nebulization TID Duncan, Hazel V, MD   2.5 mg at 06/09/22 1347   amLODipine (NORVASC) tablet 10 mg  10 mg Oral QHS Duncan, Hazel V, MD   10 mg at 06/08/22 2216   atorvastatin (LIPITOR) tablet 20 mg  20 mg Oral Daily Duncan, Hazel V, MD   20 mg at 06/09/22 0939   azithromycin (ZITHROMAX) tablet 500 mg  500 mg Oral Daily Patel, Kishan S, RPH   500 mg at 06/09/22 0939   brimonidine (ALPHAGAN) 0.2 % ophthalmic solution 1 drop  1 drop Right Eye BID Lai, Tina, MD   1 drop at 06/09/22 1223   cefTRIAXone (ROCEPHIN) 2 g in sodium chloride 0.9 % 100 mL IVPB  2 g Intravenous Q24H Lai, Tina, MD   Stopped at 06/08/22 2304   enoxaparin (LOVENOX) injection 30 mg  30 mg Subcutaneous Q24H Duncan, Hazel V, MD   30 mg at 06/08/22 2220   FLUoxetine (PROZAC) capsule 40 mg  40 mg Oral q morning Duncan, Hazel V, MD   40 mg at 06/09/22 0939   guaiFENesin-dextromethorphan (ROBITUSSIN DM) 100-10 MG/5ML syrup 10 mL  10 mL Oral Q4H PRN Duncan, Hazel V, MD       insulin aspart (novoLOG) injection 0-15 Units  0-15 Units Subcutaneous TID WC Duncan, Hazel V, MD   2 Units at 06/09/22 0937   insulin aspart (novoLOG) injection 0-5 Units  0-5 Units Subcutaneous QHS Duncan, Hazel V, MD   5 Units at 06/08/22 0338   insulin glargine-yfgn (SEMGLEE) injection 9 Units  9 Units Subcutaneous QHS Duncan, Hazel V, MD   9 Units at 06/08/22 2220   metoprolol tartrate (LOPRESSOR) tablet 50 mg  50 mg Oral BID Duncan, Hazel V, MD   50 mg at 06/09/22 0939   multivitamin with minerals tablet 1 tablet  1 tablet Oral Daily Lai, Tina, MD       mycophenolate (MYFORTIC) EC tablet 180 mg  180 mg Oral QHS Lai, Tina, MD   180 mg at 06/08/22 2215   mycophenolate (MYFORTIC) EC tablet 360 mg  360 mg Oral Daily Lai, Tina, MD   360 mg at 06/09/22 0939   ondansetron (ZOFRAN) tablet 4 mg  4  mg Oral Q6H PRN Duncan, Hazel V, MD       Or   ondansetron (  ZOFRAN) injection 4 mg  4 mg Intravenous Q6H PRN Athena Masse, MD       pantoprazole (PROTONIX) EC tablet 40 mg  40 mg Oral Daily Judd Gaudier V, MD   40 mg at 06/09/22 0939   tacrolimus (PROGRAF) capsule 1 mg  1 mg Oral QHS Enzo Bi, MD   1 mg at 06/08/22 2216   tacrolimus (PROGRAF) capsule 2 mg  2 mg Oral Daily Enzo Bi, MD   2 mg at 06/09/22 5465      Allergies: Allergies  Allergen Reactions   Latex Hives    REACTION: swelling Other reaction(s): Unknown   Other Other (See Comments)    [DERM]   Silicone     Other reaction(s): Other (See Comments) [DERM]      Past Medical History: Past Medical History:  Diagnosis Date   Anemia    Anxiety    Arthritis    Chronic kidney disease    Depression    Diabetes mellitus    Diabetic retinopathy ([place])    GERD (gastroesophageal reflux disease)    Hypertension    Memory impairment    Obsessive compulsive disorder    Osteopenia 03/2018   T score -1.9 FRAX 9.7% / 2.1%   Thrombocytopenia ([place]) 05/31/2015     Past Surgical History: Past Surgical History:  Procedure Laterality Date   APPENDECTOMY     BREAST EXCISIONAL BIOPSY Bilateral    CATARACT EXTRACTION     CESAREAN SECTION  1968   KIDNEY TRANSPLANT Right 09/2011   RETINAL DETACHMENT SURGERY Left 2006   TUBAL LIGATION  1981     Family History: Family History  Problem Relation Age of Onset   Stroke Mother    Heart disease Sister    Breast cancer Sister 78   Heart disease Brother    Diabetes Brother    Diabetes Brother      Social History: Social History   Socioeconomic History   Marital status: Widowed    Spouse name: Not on file   Number of children: 3   Years of education: Not on file   Highest education level: Not on file  Occupational History   Not on file  Tobacco Use   Smoking status: Never   Smokeless tobacco: Never   Tobacco comments:    never used tobacco  Vaping Use    Vaping Use: Never used  Substance and Sexual Activity   Alcohol use: No    Alcohol/week: 0.0 standard drinks of alcohol   Drug use: No   Sexual activity: Not Currently    Comment: 1st intercourse 45 yo-1 partner  Other Topics Concern   Not on file  Social History Narrative   Not on file   Social Determinants of Health   Financial Resource Strain: Not on file  Food Insecurity: No Food Insecurity (06/08/2022)   Hunger Vital Sign    Worried About Running Out of Food in the Last Year: Never true    Ran Out of Food in the Last Year: Never true  Transportation Needs: No Transportation Needs (06/08/2022)   PRAPARE - Hydrologist (Medical): No    Lack of Transportation (Non-Medical): No  Physical Activity: Not on file  Stress: Not on file  Social Connections: Not on file  Intimate Partner Violence: Not At Risk (06/08/2022)   Humiliation, Afraid, Rape, and Kick questionnaire    Fear of Current or Ex-Partner: No    Emotionally Abused: No  Physically Abused: No    Sexually Abused: No     Review of Systems: Review of Systems  Constitutional:  Positive for chills and malaise/fatigue. Negative for fever.  HENT:  Negative for congestion, sore throat and tinnitus.   Eyes:  Negative for blurred vision and redness.  Respiratory:  Positive for cough. Negative for shortness of breath and wheezing.   Cardiovascular:  Negative for chest pain, palpitations, claudication and leg swelling.  Gastrointestinal:  Positive for nausea. Negative for abdominal pain, blood in stool, diarrhea and vomiting.  Genitourinary:  Negative for flank pain, frequency and hematuria.  Musculoskeletal:  Positive for myalgias. Negative for back pain and falls.  Skin:  Negative for rash.  Neurological:  Negative for dizziness, weakness and headaches.  Endo/Heme/Allergies:  Does not bruise/bleed easily.  Psychiatric/Behavioral:  Negative for depression. The patient is not nervous/anxious and  does not have insomnia.     Vital Signs: Blood pressure (!) 148/63, pulse 70, temperature 98.2 F (36.8 C), resp. rate 17, height 5' 7" (1.702 m), weight 53.1 kg, SpO2 92 %.  Weight trends: Filed Weights   06/29/2022 1206  Weight: 53.1 kg    Physical Exam: General: NAD  Head: Normocephalic, atraumatic.   Eyes: Anicteric  Lungs:  Clear to auscultation,, normal effort  Heart: Regular rate and rhythm  Abdomen:  Soft, nontender  Extremities: No peripheral edema.  Neurologic: Alert and oriented, moving all four extremities  Skin: No lesions  Access: None     Lab results: Basic Metabolic Panel: Recent Labs  Lab 06/09/2022 2012 06/08/22 0459 06/09/22 0532  NA 129* 128* 131*  K 4.4 4.5 4.0  CL 101 100 104  CO2 16* 15* 19*  GLUCOSE 335* 399* 160*  BUN 38* 48* 54*  CREATININE 1.48* 1.81* 1.97*  CALCIUM 7.6* 7.9* 8.4*  MG  --   --  2.0    Liver Function Tests: Recent Labs  Lab 06/10/2022 1213  AST 23  ALT 13  ALKPHOS 63  BILITOT 1.1  PROT 7.1  ALBUMIN 3.4*   Recent Labs  Lab 06/20/2022 1213  LIPASE 27   No results for input(s): "AMMONIA" in the last 168 hours.  CBC: Recent Labs  Lab 06/06/2022 1213 06/08/22 0459 06/09/22 0532  WBC 7.3 7.3 8.6  NEUTROABS 5.0  --   --   HGB 10.4* 9.7* 10.2*  HCT 32.4* 31.5* 30.7*  MCV 86.6 89.2 84.3  PLT 151 159 176    Cardiac Enzymes: No results for input(s): "CKTOTAL", "CKMB", "CKMBINDEX", "TROPONINI" in the last 168 hours.  BNP: Invalid input(s): "POCBNP"  CBG: Recent Labs  Lab 06/08/22 1131 06/08/22 1623 06/08/22 2113 06/09/22 0924 06/09/22 1213  GLUCAP 305* 192* 163* 141* 121*    Microbiology: Results for orders placed or performed during the hospital encounter of 06/22/2022  Resp panel by RT-PCR (RSV, Flu A&B, Covid) Anterior Nasal Swab     Status: Abnormal   Collection Time: 06/02/2022 12:13 PM   Specimen: Anterior Nasal Swab  Result Value Ref Range Status   SARS Coronavirus 2 by RT PCR NEGATIVE NEGATIVE  Final    Comment: (NOTE) SARS-CoV-2 target nucleic acids are NOT DETECTED.  The SARS-CoV-2 RNA is generally detectable in upper respiratory specimens during the acute phase of infection. The lowest concentration of SARS-CoV-2 viral copies this assay can detect is 138 copies/mL. A negative result does not preclude SARS-Cov-2 infection and should not be used as the sole basis for treatment or other patient management decisions. A negative result   may occur with  improper specimen collection/handling, submission of specimen other than nasopharyngeal swab, presence of viral mutation(s) within the areas targeted by this assay, and inadequate number of viral copies(<138 copies/mL). A negative result must be combined with clinical observations, patient history, and epidemiological information. The expected result is Negative.  Fact Sheet for Patients:  https://www.fda.gov/media/152166/download  Fact Sheet for Healthcare Providers:  https://www.fda.gov/media/152162/download  This test is no t yet approved or cleared by the [place] FDA and  has been authorized for detection and/or diagnosis of SARS-CoV-2 by FDA under an Emergency Use Authorization (EUA). This EUA will remain  in effect (meaning this test can be used) for the duration of the COVID-19 declaration under Section 564(b)(1) of the Act, 21 U.S.C.section 360bbb-3(b)(1), unless the authorization is terminated  or revoked sooner.       Influenza A by PCR NEGATIVE NEGATIVE Final   Influenza B by PCR NEGATIVE NEGATIVE Final    Comment: (NOTE) The Xpert Xpress SARS-CoV-2/FLU/RSV plus assay is intended as an aid in the diagnosis of influenza from Nasopharyngeal swab specimens and should not be used as a sole basis for treatment. Nasal washings and aspirates are unacceptable for Xpert Xpress SARS-CoV-2/FLU/RSV testing.  Fact Sheet for Patients: https://www.fda.gov/media/152166/download  Fact Sheet for Healthcare  Providers: https://www.fda.gov/media/152162/download  This test is not yet approved or cleared by the [place] FDA and has been authorized for detection and/or diagnosis of SARS-CoV-2 by FDA under an Emergency Use Authorization (EUA). This EUA will remain in effect (meaning this test can be used) for the duration of the COVID-19 declaration under Section 564(b)(1) of the Act, 21 U.S.C. section 360bbb-3(b)(1), unless the authorization is terminated or revoked.     Resp Syncytial Virus by PCR POSITIVE (A) NEGATIVE Final    Comment: (NOTE) Fact Sheet for Patients: https://www.fda.gov/media/152166/download  Fact Sheet for Healthcare Providers: https://www.fda.gov/media/152162/download  This test is not yet approved or cleared by the [place] FDA and has been authorized for detection and/or diagnosis of SARS-CoV-2 by FDA under an Emergency Use Authorization (EUA). This EUA will remain in effect (meaning this test can be used) for the duration of the COVID-19 declaration under Section 564(b)(1) of the Act, 21 U.S.C. section 360bbb-3(b)(1), unless the authorization is terminated or revoked.  Performed at [facility], [address] [place], [place] [address]     Coagulation Studies: No results for input(s): "LABPROT", "INR" in the last 72 hours.  Urinalysis: No results for input(s): "COLORURINE", "LABSPEC", "PHURINE", "GLUCOSEU", "HGBUR", "BILIRUBINUR", "KETONESUR", "PROTEINUR", "UROBILINOGEN", "NITRITE", "LEUKOCYTESUR" in the last 72 hours.  Invalid input(s): "APPERANCEUR"    Imaging: CT CHEST ABDOMEN PELVIS W CONTRAST  Result Date: 06/28/2022 CLINICAL DATA:  Nausea, weight loss for several weeks, cough, chills, abnormal chest x-ray EXAM: CT CHEST, ABDOMEN, AND PELVIS WITH CONTRAST TECHNIQUE: Multidetector CT imaging of the chest, abdomen and pelvis was performed following the standard protocol during bolus administration of intravenous contrast. RADIATION  DOSE REDUCTION: This exam was performed according to the departmental dose-optimization program which includes automated exposure control, adjustment of the mA and/or kV according to patient size and/or use of iterative reconstruction technique. CONTRAST:  65mL OMNIPAQUE IOHEXOL 300 MG/ML  SOLN COMPARISON:  12/10/2013, 06/06/2022 FINDINGS: CT CHEST FINDINGS Cardiovascular: There is dense calcification of the aortic and mitral valves. Otherwise the heart is unremarkable without pericardial effusion. No evidence of thoracic aortic aneurysm or dissection. Atherosclerosis of the aorta and coronary vasculature. Mediastinum/Nodes: No enlarged mediastinal, hilar, or axillary lymph nodes. Thyroid gland, trachea, and   esophagus demonstrate no significant findings. Lungs/Pleura: There are small bilateral free-flowing pleural effusions. There is patchy consolidation within the dependent bilateral lower lobes, which could reflect pneumonia or hypoventilatory change. Bronchiectasis and scarring within the right middle lobe and lingular segment left upper lobe. No pneumothorax. The central airways are patent. There is patchy tree in bud nodular airspace disease within the right upper lobe, likely inflammatory or infectious. This nodular consolidation measures 3 mm image 49/4 and 4 mm image 74/4. Musculoskeletal: No acute or destructive bony lesions. Reconstructed images demonstrate no additional findings. CT ABDOMEN PELVIS FINDINGS Hepatobiliary: No focal liver abnormality is seen. No gallstones, gallbladder wall thickening, or biliary dilatation. Pancreas: Unremarkable. No pancreatic ductal dilatation or surrounding inflammatory changes. Spleen: Normal in size without focal abnormality. Adrenals/Urinary Tract: Marked atrophy of the native kidneys. Right lower quadrant transplant kidney is identified, with nonobstructing calculi measuring up to 7 mm. No hydronephrosis or renal mass. The bladder is unremarkable.  The adrenals are  normal. Stomach/Bowel: No bowel obstruction or ileus. The appendix is surgically absent. No bowel wall thickening or inflammatory change. Vascular/Lymphatic: Aortic atherosclerosis. No enlarged abdominal or pelvic lymph nodes. Reproductive: Uterus and bilateral adnexa are unremarkable. Other: No free fluid or free intraperitoneal gas. No abdominal wall hernia. Musculoskeletal: There are no acute or destructive bony lesions. Reconstructed images demonstrate no additional findings. IMPRESSION: 1. Small bilateral pleural effusions. 2. Patchy areas of airspace disease as above, most pronounced within the lower lobes and right upper lobe. Favor multifocal pneumonia given clinical presentation. Continued follow up after appropriate medical management is recommended to document resolution. 3. Right lower quadrant transplant kidney, with nonobstructing renal calculi measuring up to 7 mm. 4. Calcification of the aortic and mitral valves. 5. Aortic Atherosclerosis (ICD10-I70.0). Coronary artery atherosclerosis. Electronically Signed   By: Randa Ngo M.D.   On: 06/03/2022 18:19     Assessment & Plan: Ms. ANAHIT KLUMB is a 76 y.o.  female with past medical conditions including diabetes type 2, paroxysmal atrial fibrillation, mild cognitive deficits and kidney transplant in 2013, who was admitted to [facility] on 06/21/2022 for Hypoxia [R09.02] RSV infection [B33.8] Pneumonia due to respiratory syncytial virus (RSV) [J12.1] Community acquired pneumonia, unspecified laterality [J18.9]  Acute kidney injury on chronic kidney disease IIIb.  Patient states baseline creatinine 1.2.  Currently followed by [facility] in [place].  Patient has received renal transplant in 2013 at [facility].  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  Recommend maintaining home immunosuppressive regiment which includes, mycophenolate 360 mg in the morning and 180 mg at night and tacrolimus 2 mg in the morning  and 1 mg at night.  Patient encouraged to increase oral intake.  Expect renal function to return with treatment of virus along with nutrition.  Will hold IV fluids at this time received 2 L IV fluid bolus in ED.  2.  Hyponatremia, likely due to poor oral intake.  129 on ED arrival.  Currently 131.  Patient encouraged to increase oral intake.  3.  Acute metabolic acidosis.  Likely secondary to acute kidney injury.  Currently self correcting.  Will continue to monitor.  4.  Pneumonia due to RSV.  IV antibiotics managed by primary team, Rocephin and azithromycin. Continue supportive care.  LOS: 2 [place] 12/8/20232:20 PM

## 2022-06-10 ENCOUNTER — Inpatient Hospital Stay: Payer: Medicare Other

## 2022-06-10 DIAGNOSIS — J121 Respiratory syncytial virus pneumonia: Secondary | ICD-10-CM | POA: Diagnosis not present

## 2022-06-10 LAB — BLOOD GAS, VENOUS
Acid-base deficit: 7.4 mmol/L — ABNORMAL HIGH (ref 0.0–2.0)
Bicarbonate: 17.3 mmol/L — ABNORMAL LOW (ref 20.0–28.0)
O2 Saturation: 81.6 %
Patient temperature: 37
pCO2, Ven: 32 mmHg — ABNORMAL LOW (ref 44–60)
pH, Ven: 7.34 (ref 7.25–7.43)
pO2, Ven: 50 mmHg — ABNORMAL HIGH (ref 32–45)

## 2022-06-10 LAB — CBC
HCT: 31.3 % — ABNORMAL LOW (ref 36.0–46.0)
Hemoglobin: 10.4 g/dL — ABNORMAL LOW (ref 12.0–15.0)
MCH: 28.1 pg (ref 26.0–34.0)
MCHC: 33.2 g/dL (ref 30.0–36.0)
MCV: 84.6 fL (ref 80.0–100.0)
Platelets: 182 10*3/uL (ref 150–400)
RBC: 3.7 MIL/uL — ABNORMAL LOW (ref 3.87–5.11)
RDW: 13.2 % (ref 11.5–15.5)
WBC: 9.2 10*3/uL (ref 4.0–10.5)
nRBC: 0 % (ref 0.0–0.2)

## 2022-06-10 LAB — CBC WITH DIFFERENTIAL/PLATELET
Abs Immature Granulocytes: 0.08 10*3/uL — ABNORMAL HIGH (ref 0.00–0.07)
Basophils Absolute: 0 10*3/uL (ref 0.0–0.1)
Basophils Relative: 0 %
Eosinophils Absolute: 0 10*3/uL (ref 0.0–0.5)
Eosinophils Relative: 0 %
HCT: 31.1 % — ABNORMAL LOW (ref 36.0–46.0)
Hemoglobin: 10.2 g/dL — ABNORMAL LOW (ref 12.0–15.0)
Immature Granulocytes: 1 %
Lymphocytes Relative: 6 %
Lymphs Abs: 0.8 10*3/uL (ref 0.7–4.0)
MCH: 27.9 pg (ref 26.0–34.0)
MCHC: 32.8 g/dL (ref 30.0–36.0)
MCV: 85 fL (ref 80.0–100.0)
Monocytes Absolute: 0.7 10*3/uL (ref 0.1–1.0)
Monocytes Relative: 5 %
Neutro Abs: 11.6 10*3/uL — ABNORMAL HIGH (ref 1.7–7.7)
Neutrophils Relative %: 88 %
Platelets: 212 10*3/uL (ref 150–400)
RBC: 3.66 MIL/uL — ABNORMAL LOW (ref 3.87–5.11)
RDW: 13.2 % (ref 11.5–15.5)
WBC: 13.2 10*3/uL — ABNORMAL HIGH (ref 4.0–10.5)
nRBC: 0 % (ref 0.0–0.2)

## 2022-06-10 LAB — COMPREHENSIVE METABOLIC PANEL
ALT: 21 U/L (ref 0–44)
AST: 36 U/L (ref 15–41)
Albumin: 3.4 g/dL — ABNORMAL LOW (ref 3.5–5.0)
Alkaline Phosphatase: 67 U/L (ref 38–126)
Anion gap: 13 (ref 5–15)
BUN: 64 mg/dL — ABNORMAL HIGH (ref 8–23)
CO2: 16 mmol/L — ABNORMAL LOW (ref 22–32)
Calcium: 8.4 mg/dL — ABNORMAL LOW (ref 8.9–10.3)
Chloride: 101 mmol/L (ref 98–111)
Creatinine, Ser: 2.4 mg/dL — ABNORMAL HIGH (ref 0.44–1.00)
GFR, Estimated: 20 mL/min — ABNORMAL LOW (ref >60)
Glucose, Bld: 247 mg/dL — ABNORMAL HIGH (ref 70–99)
Potassium: 4 mmol/L (ref 3.5–5.1)
Sodium: 130 mmol/L — ABNORMAL LOW (ref 135–145)
Total Bilirubin: 0.6 mg/dL (ref 0.3–1.2)
Total Protein: 7.1 g/dL (ref 6.5–8.1)

## 2022-06-10 LAB — BASIC METABOLIC PANEL
Anion gap: 10 (ref 5–15)
BUN: 52 mg/dL — ABNORMAL HIGH (ref 8–23)
CO2: 18 mmol/L — ABNORMAL LOW (ref 22–32)
Calcium: 8.2 mg/dL — ABNORMAL LOW (ref 8.9–10.3)
Chloride: 103 mmol/L (ref 98–111)
Creatinine, Ser: 1.95 mg/dL — ABNORMAL HIGH (ref 0.44–1.00)
GFR, Estimated: 26 mL/min — ABNORMAL LOW (ref >60)
Glucose, Bld: 132 mg/dL — ABNORMAL HIGH (ref 70–99)
Potassium: 3.7 mmol/L (ref 3.5–5.1)
Sodium: 131 mmol/L — ABNORMAL LOW (ref 135–145)

## 2022-06-10 LAB — GLUCOSE, CAPILLARY
Glucose-Capillary: 155 mg/dL — ABNORMAL HIGH (ref 70–99)
Glucose-Capillary: 192 mg/dL — ABNORMAL HIGH (ref 70–99)
Glucose-Capillary: 248 mg/dL — ABNORMAL HIGH (ref 70–99)
Glucose-Capillary: 230 mg/dL — ABNORMAL HIGH (ref 70–99)
Glucose-Capillary: 223 mg/dL — ABNORMAL HIGH (ref 70–99)

## 2022-06-10 LAB — LACTIC ACID, PLASMA
Lactic Acid, Venous: 3.1 mmol/L (ref 0.5–1.9)
Lactic Acid, Venous: 1.7 mmol/L (ref 0.5–1.9)

## 2022-06-10 LAB — MAGNESIUM
Magnesium: 2.1 mg/dL (ref 1.7–2.4)
Magnesium: 2.2 mg/dL (ref 1.7–2.4)

## 2022-06-10 LAB — D-DIMER, QUANTITATIVE: D-Dimer, Quant: 1.72 ug/mL-FEU — ABNORMAL HIGH (ref 0.00–0.50)

## 2022-06-10 LAB — PROCALCITONIN: Procalcitonin: 1.8 ng/mL

## 2022-06-10 MED ORDER — VANCOMYCIN HCL 1250 MG/250ML IV SOLN
1250.0000 mg | Freq: Once | INTRAVENOUS | Status: AC
  Administered 2022-06-10: 1250 mg via INTRAVENOUS
  Filled 2022-06-10: qty 250

## 2022-06-10 MED ORDER — VANCOMYCIN HCL 750 MG/150ML IV SOLN
750.0000 mg | INTRAVENOUS | Status: DC
  Administered 2022-06-12: 750 mg via INTRAVENOUS
  Filled 2022-06-10: qty 150

## 2022-06-10 MED ORDER — PROCHLORPERAZINE EDISYLATE 10 MG/2ML IJ SOLN
10.0000 mg | Freq: Once | INTRAMUSCULAR | Status: AC
  Administered 2022-06-10: 10 mg via INTRAVENOUS
  Filled 2022-06-10: qty 2

## 2022-06-10 MED ORDER — STERILE WATER FOR INJECTION IV SOLN
INTRAVENOUS | Status: DC
  Filled 2022-06-10: qty 150
  Filled 2022-06-10: qty 1000

## 2022-06-10 MED ORDER — ALBUTEROL SULFATE (2.5 MG/3ML) 0.083% IN NEBU
2.5000 mg | INHALATION_SOLUTION | Freq: Four times a day (QID) | RESPIRATORY_TRACT | Status: DC | PRN
  Administered 2022-06-10: 2.5 mg via RESPIRATORY_TRACT
  Filled 2022-06-10: qty 3

## 2022-06-10 MED ORDER — ALBUTEROL SULFATE (2.5 MG/3ML) 0.083% IN NEBU
2.5000 mg | INHALATION_SOLUTION | Freq: Two times a day (BID) | RESPIRATORY_TRACT | Status: DC
  Administered 2022-06-11 – 2022-06-14 (×7): 2.5 mg via RESPIRATORY_TRACT
  Filled 2022-06-10 (×7): qty 3

## 2022-06-10 MED ORDER — SODIUM CHLORIDE 0.9 % IV SOLN
12.5000 mg | Freq: Four times a day (QID) | INTRAVENOUS | Status: DC | PRN
  Administered 2022-06-10: 12.5 mg via INTRAVENOUS
  Filled 2022-06-10: qty 0.5

## 2022-06-10 MED ORDER — LORAZEPAM 2 MG/ML IJ SOLN
1.0000 mg | Freq: Once | INTRAMUSCULAR | Status: AC
  Administered 2022-06-10: 1 mg via INTRAVENOUS
  Filled 2022-06-10: qty 1

## 2022-06-10 MED ORDER — CHLORHEXIDINE GLUCONATE CLOTH 2 % EX PADS
6.0000 | MEDICATED_PAD | Freq: Every day | CUTANEOUS | Status: DC
  Administered 2022-06-10 – 2022-06-13 (×4): 6 via TOPICAL

## 2022-06-10 MED ORDER — INSULIN ASPART 100 UNIT/ML IJ SOLN
3.0000 [IU] | Freq: Three times a day (TID) | INTRAMUSCULAR | Status: DC
  Administered 2022-06-11 (×2): 3 [IU] via SUBCUTANEOUS
  Filled 2022-06-10 (×2): qty 1

## 2022-06-10 MED ORDER — LACTATED RINGERS IV SOLN: INTRAVENOUS | Status: DC

## 2022-06-10 MED ORDER — SODIUM CHLORIDE 0.9 % IV SOLN
2.0000 g | INTRAVENOUS | Status: DC
  Administered 2022-06-11 – 2022-06-13 (×3): 2 g via INTRAVENOUS
  Filled 2022-06-10 (×3): qty 2

## 2022-06-10 NOTE — Progress Notes (Signed)
   06/10/22 1930  Assess: MEWS Score  BP 121/76  Pulse Rate 95  Resp (!) 32  Level of Consciousness Responds to Pain  SpO2 93 %  O2 Device Non-rebreather Mask  O2 Flow Rate (L/min) 13 L/min  Assess: MEWS Score  MEWS Temp 0  MEWS Systolic 0  MEWS Pulse 0  MEWS RR 2  MEWS LOC 2  MEWS Score 4  MEWS Score Color Red  Assess: if the MEWS score is Yellow or Red  Were vital signs taken at a resting state? Yes  Focused Assessment Change from prior assessment (see assessment flowsheet)  Does the patient meet 2 or more of the SIRS criteria? Yes  Does the patient have a confirmed or suspected source of infection? Yes  Provider and Rapid Response Notified? Yes  MEWS guidelines implemented *See Row Information* Yes  Treat  MEWS Interventions Escalated (See documentation below) (Vitals obtained during RRT)  Provider Notification  Provider Name/Title Sharion Settler, NP  Date Provider Notified 06/10/22  Time Provider Notified 1915  Method of Notification Page  Notification Reason Change in status  Provider response At bedside  Date of Provider Response 06/10/22  Time of Provider Response 1915  Notify: Rapid Response  Name of Rapid Response RN Notified [place], RN  Date Rapid Response Notified 06/10/22  Time Rapid Response Notified 1915  Document  Patient Outcome Transferred/level of care increased  Assess: SIRS CRITERIA  SIRS Temperature  0  SIRS Pulse 1  SIRS Respirations  1  SIRS WBC 1  SIRS Score Sum  3

## 2022-06-10 NOTE — Progress Notes (Addendum)
PROGRESS NOTE    Jasmine Cummings  XBJ:[id number] DOB: [birth year] DOA: 06/18/2022 PCP: Reynold Bowen, MD  [id number]/[id number]  LOS: 3 days   Brief hospital course:   Assessment & Plan: Jasmine Cummings is a 76 y.o. female with medical history significant for Kidney transplant in 2013 on immunosuppressants, , mild cognitive deficits/dementia seen by neurology 04/2022, paroxysmal A-fib, not on anticoagulation , and history of insulin-dependent type 2 diabetes, whoPresents to the ED by EMS with a 2-week history of body aches, chills, cough and nausea with a history of exposure to RSV and flu by grandson.  She reports decreased oral intake due to nausea and weight loss.   * Pneumonia due to respiratory syncytial virus (RSV) Acute hypoxic respiratory failure Patient has a 2-week history of body aches chills and cough and nausea with decreased oral intake.  O2 sat 88 in the ED requiring 4 L to maintain sats in the mid 90s. CT chest showing airspace disease bilateral lower lobes and right upper lobe favoring multifocal pneumonia RSV positive --Given Rocephin and azithromycin in the ED.  Procal elevated at 1.43. Plan: --cont ceftriaxone and azithromycin to cover empirically for bacterial PNA given elevated procal and pt's immunocompromised state. --Continue supplemental O2 to keep sats >=92%, wean as tolerated  Hyponatremia --pt appeared to have a hx of hyponatremia.  Na didn't improve with IVF. --monitor Na   AKI Metabolic acidosis --Cr 3.08 on presentation, last Cr 0.86 from April 2020.  Cr trending up despite IVF given on presentation. --nephrology consulted --MIVF'@50'$   History of kidney transplant 2013 on chronic immunosuppression --cont home tacrolimus and mycophenolate --nephrology consulted   Uncontrolled type 2 diabetes mellitus with hyperglycemia, with long-term current use of insulin (HCC) --cont glargine 9u nightly --mealtime 3u TID --ACHS and SSI   Dementia without behavioral  disturbance ([place]) Diagnosed in October 2023 at neurology visit with qualifying Mini-Mental score Not currently on medication   PAF (paroxysmal atrial fibrillation) ([place]) Patient not on any blood thinners Continue metoprolol   Essential hypertension  BP controlled.   Continue amlodipine and Lopressor  Intractable nausea --No frank vomiting.  Had BM this morning.  Had CT c/a/p on presentation that showed no acute finding other than lungs. Received zofran, phenergan, compazine and ativan back to back.   --supportive care for now --MIVF'@50'$     DVT prophylaxis: Lovenox SQ Code Status: Full code  Family Communication:  Level of care: Med-Surg Dispo:   The patient is from: home Anticipated d/c is to: home Anticipated d/c date is: 1-2 days   Subjective and Interval History:  Pt had intractable nausea today, without vomiting.  Unable to take her oral meds.   Objective: Vitals:   06/10/22 0440 06/10/22 0744 06/10/22 0808 06/10/22 1624  BP:  (!) 129/52  126/63  Pulse:  85  93  Resp:  18  17  Temp:  99.7 F (37.6 C)  98.6 F (37 C)  TempSrc:      SpO2: 93% 92% 100% 90%  Weight:      Height:        Intake/Output Summary (Last 24 hours) at 06/10/2022 1910 Last data filed at 06/10/2022 1535 Gross per 24 hour  Intake 371.13 ml  Output --  Net 371.13 ml   Filed Weights   07/02/2022 1206  Weight: 53.1 kg    Examination:   Constitutional: in mild distress due to nausea, oriented HEENT: conjunctivae and lids normal, EOMI CV: No cyanosis.   RESP: normal respiratory effort SKIN:  warm, dry Psych: depressed mood and affect.     Data Reviewed: I have personally reviewed labs and imaging studies  Time spent: 50 minutes  Enzo Bi, MD [facility] If 7PM-7AM, please contact night-coverage 06/10/2022, 7:10 PM

## 2022-06-10 NOTE — Progress Notes (Signed)
[place], [place] 06/10/22  Subjective:   Hospital day # 3  Patient is feeling sick today.  Nauseous, sitting at the edge of the bed Has some lower extremity edema Denies any shortness of breath  Renal: No intake/output data recorded. Lab Results  Component Value Date   CREATININE 1.95 (H) 06/10/2022   CREATININE 1.97 (H) 06/09/2022   CREATININE 1.81 (H) 06/08/2022     Objective:  Vital signs in last 24 hours:  Temp:  [98.5 F (36.9 C)-99.7 F (37.6 C)] 99.7 F (37.6 C) (12/09 0744) Pulse Rate:  [71-85] 85 (12/09 0744) Resp:  [18-24] 18 (12/09 0744) BP: (129-134)/(52-90) 129/52 (12/09 0744) SpO2:  [92 %-100 %] 100 % (12/09 0808)  Weight change:  Filed Weights   06/11/2022 1206  Weight: 53.1 kg    Intake/Output:   No intake or output data in the 24 hours ending 06/10/22 0943   Physical Exam: General: Ill-appearing, no acute distress  HEENT Dry oral mucous membranes  Pulm/lungs Mild basilar coarse breath sounds, normal effort   CVS/Heart No rub  Abdomen:  Soft, nontender  Extremities: Pitting edema over lower shins and ankles  Neurologic: Alert, oriented  Skin: No acute rashes          Basic Metabolic Panel:  Recent Labs  Lab 06/13/2022 1213 06/06/2022 2012 06/08/22 0459 06/09/22 0532 06/10/22 0532  NA 129* 129* 128* 131* 131*  K 4.4 4.4 4.5 4.0 3.7  CL 97* 101 100 104 103  CO2 20* 16* 15* 19* 18*  GLUCOSE 293* 335* 399* 160* 132*  BUN 34* 38* 48* 54* 52*  CREATININE 1.49* 1.48* 1.81* 1.97* 1.95*  CALCIUM 8.5* 7.6* 7.9* 8.4* 8.2*  MG  --   --   --  2.0 2.1     CBC: Recent Labs  Lab 06/06/2022 1213 06/08/22 0459 06/09/22 0532 06/10/22 0532  WBC 7.3 7.3 8.6 9.2  NEUTROABS 5.0  --   --   --   HGB 10.4* 9.7* 10.2* 10.4*  HCT 32.4* 31.5* 30.7* 31.3*  MCV 86.6 89.2 84.3 84.6  PLT 151 159 176 182     No results found for: "HEPBSAG", "HEPBSAB", "HEPBIGM"    Microbiology:  Recent Results (from the past 240  hour(s))  Resp panel by RT-PCR (RSV, Flu A&B, Covid) Anterior Nasal Swab     Status: Abnormal   Collection Time: 06/15/2022 12:13 PM   Specimen: Anterior Nasal Swab  Result Value Ref Range Status   SARS Coronavirus 2 by RT PCR NEGATIVE NEGATIVE Final    Comment: (NOTE) SARS-CoV-2 target nucleic acids are NOT DETECTED.  The SARS-CoV-2 RNA is generally detectable in upper respiratory specimens during the acute phase of infection. The lowest concentration of SARS-CoV-2 viral copies this assay can detect is 138 copies/mL. A negative result does not preclude SARS-Cov-2 infection and should not be used as the sole basis for treatment or other patient management decisions. A negative result may occur with  improper specimen collection/handling, submission of specimen other than nasopharyngeal swab, presence of viral mutation(s) within the areas targeted by this assay, and inadequate number of viral copies(<138 copies/mL). A negative result must be combined with clinical observations, patient history, and epidemiological information. The expected result is Negative.  Fact Sheet for Patients:  EntrepreneurPulse.com.au  Fact Sheet for Healthcare Providers:  IncredibleEmployment.be  This test is no t yet approved or cleared by the [place] FDA and  has been authorized for detection and/or diagnosis of SARS-CoV-2 by FDA  under an Emergency Use Authorization (EUA). This EUA will remain  in effect (meaning this test can be used) for the duration of the COVID-19 declaration under Section 564(b)(1) of the Act, 21 U.S.C.section 360bbb-3(b)(1), unless the authorization is terminated  or revoked sooner.       Influenza A by PCR NEGATIVE NEGATIVE Final   Influenza B by PCR NEGATIVE NEGATIVE Final    Comment: (NOTE) The Xpert Xpress SARS-CoV-2/FLU/RSV plus assay is intended as an aid in the diagnosis of influenza from Nasopharyngeal swab specimens and should  not be used as a sole basis for treatment. Nasal washings and aspirates are unacceptable for Xpert Xpress SARS-CoV-2/FLU/RSV testing.  Fact Sheet for Patients: EntrepreneurPulse.com.au  Fact Sheet for Healthcare Providers: IncredibleEmployment.be  This test is not yet approved or cleared by the [place] FDA and has been authorized for detection and/or diagnosis of SARS-CoV-2 by FDA under an Emergency Use Authorization (EUA). This EUA will remain in effect (meaning this test can be used) for the duration of the COVID-19 declaration under Section 564(b)(1) of the Act, 21 U.S.C. section 360bbb-3(b)(1), unless the authorization is terminated or revoked.     Resp Syncytial Virus by PCR POSITIVE (A) NEGATIVE Final    Comment: (NOTE) Fact Sheet for Patients: EntrepreneurPulse.com.au  Fact Sheet for Healthcare Providers: IncredibleEmployment.be  This test is not yet approved or cleared by the [place] FDA and has been authorized for detection and/or diagnosis of SARS-CoV-2 by FDA under an Emergency Use Authorization (EUA). This EUA will remain in effect (meaning this test can be used) for the duration of the COVID-19 declaration under Section 564(b)(1) of the Act, 21 U.S.C. section 360bbb-3(b)(1), unless the authorization is terminated or revoked.  Performed at [facility], [address] [place], [place] [address]     Coagulation Studies: No results for input(s): "LABPROT", "INR" in the last 72 hours.  Urinalysis: No results for input(s): "COLORURINE", "LABSPEC", "PHURINE", "GLUCOSEU", "HGBUR", "BILIRUBINUR", "KETONESUR", "PROTEINUR", "UROBILINOGEN", "NITRITE", "LEUKOCYTESUR" in the last 72 hours.  Invalid input(s): "APPERANCEUR"    Imaging: No results found.   Medications:    cefTRIAXone (ROCEPHIN)  IV Stopped (06/09/22 2125)    albuterol  2.5 mg Nebulization TID    amLODipine  10 mg Oral QHS   atorvastatin  20 mg Oral Daily   azithromycin  500 mg Oral Daily   brimonidine  1 drop Right Eye BID   enoxaparin (LOVENOX) injection  30 mg Subcutaneous Q24H   FLUoxetine  40 mg Oral q morning   insulin aspart  0-15 Units Subcutaneous TID WC   insulin aspart  0-5 Units Subcutaneous QHS   insulin glargine-yfgn  9 Units Subcutaneous QHS   metoprolol tartrate  50 mg Oral BID   multivitamin with minerals  1 tablet Oral Daily   mycophenolate  180 mg Oral QHS   mycophenolate  360 mg Oral Daily   pantoprazole  40 mg Oral Daily   tacrolimus  1 mg Oral QHS   tacrolimus  2 mg Oral Daily   acetaminophen **OR** acetaminophen, albuterol, guaiFENesin-dextromethorphan, hydrOXYzine, ondansetron **OR** ondansetron (ZOFRAN) IV  Assessment/ Plan:  76 y.o. female with  diabetes type 2, paroxysmal atrial fibrillation, mild cognitive deficits and kidney transplant in 2013, who was  admitted on 06/19/2022 for Hypoxia [R09.02] RSV infection [B33.8] Pneumonia due to respiratory syncytial virus (RSV) [J12.1] Community acquired pneumonia, unspecified laterality [J18.9]  1. 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  Renal transplant status.  Patient states baseline creatinine 1.2.  Currently followed by [facility] in [place].  Patient has received deceased donor renal transplant in 2013 at [facility].  Kidney failure related to diabetes.  Continue home immunosuppressive regimen which includes, mycophenolate 360 mg in the morning and 180 mg at night and tacrolimus 2 mg in the morning and 1 mg at night.  Patient encouraged to increase oral intake.  Expect renal function to return with treatment of underlying viral illness along with nutrition.   Oral intake today appears to be poor.  Will start maintenance fluids. Serum creatinine about the same as yesterday.   2.  Hyponatremia, likely due to poor oral intake.  129 on ED arrival.  Currently  131.  Patient encouraged to increase oral intake.   3.  Acute metabolic acidosis.  Likely secondary to acute kidney injury.  Currently self correcting.  Will continue to monitor.   4.  Pneumonia due to RSV.  IV antibiotics managed by primary team, Rocephin and azithromycin. Continue supportive care.      LOS: [place] 12/9/20239:43 [place], [place]  Note: This note was prepared with Dragon dictation. Any transcription errors are unintentional

## 2022-06-10 NOTE — Progress Notes (Addendum)
Pharmacy Antibiotic Note  Jasmine Cummings is a 76 y.o. female admitted on 06/10/2022 with pneumonia.  Pharmacy has been consulted for Vanc, Cefepime dosing.  Acute on Chronic kidney disease , not on HD.   Plan: Cefepime 2 gm IV Q24H ordered to start on 12/10 @ 0100.  Vancomycin 1250 mg IV X 1 ordered for 12/10 @ 0000. Vancomycin 750 mg IV Q48H ordered to start on 12/12 @ 0000.  AUC = 543.2 Vanc trough = 14.5   Height: '5\' 7"'$  (170.2 cm) Weight: 53.1 kg (117 lb 1 oz) IBW/kg (Calculated) : 61.6  Temp (24hrs), Avg:98.8 F (37.1 C), Min:98.2 F (36.8 C), Max:99.7 F (37.6 C)  Recent Labs  Lab 06/22/2022 1213 06/05/2022 2012 06/08/22 0459 06/09/22 0532 06/10/22 0532 06/10/22 1930 06/10/22 2220  WBC 7.3  --  7.3 8.6 9.2 13.2*  --   CREATININE 1.49* 1.48* 1.81* 1.97* 1.95* 2.40*  --   LATICACIDVEN  --   --   --   --   --  3.1* 1.7    Estimated Creatinine Clearance: 16.5 mL/min (A) (by C-G formula based on SCr of 2.4 mg/dL (H)).    Allergies  Allergen Reactions   Latex Hives    REACTION: swelling Other reaction(s): Unknown   Other Other (See Comments)    [DERM]   Silicone     Other reaction(s): Other (See Comments) [DERM]    Antimicrobials this admission:   >>    >>   Dose adjustments this admission:   Microbiology results:  BCx:   UCx:    Sputum:    MRSA PCR:   Thank you for allowing pharmacy to be a part of this patient's care.  Satoria Dunlop D 06/10/2022 11:44 PM

## 2022-06-10 NOTE — Progress Notes (Signed)
Upon arrival to pt's room for bedside report, pt noted to be in respiratory distress with tachypnea, use of accessory muscles and responding to only painful stimuli.     02 sats 74% on 4L at that time, pt placed on NRB and RRT called.

## 2022-06-10 NOTE — Plan of Care (Signed)
  Problem: Respiratory: Goal: Ability to maintain adequate ventilation will improve Outcome: Progressing   Problem: Respiratory: Goal: Ability to maintain a clear airway will improve Outcome: Progressing   Problem: Education: Goal: Ability to describe self-care measures that may prevent or decrease complications (Diabetes Survival Skills Education) will improve Outcome: Progressing   Problem: Nutritional: Goal: Maintenance of adequate nutrition will improve Outcome: Progressing   Problem: Clinical Measurements: Goal: Will remain free from infection Outcome: Progressing   Problem: Clinical Measurements: Goal: Respiratory complications will improve Outcome: Progressing   Problem: Pain Managment: Goal: General experience of comfort will improve Outcome: Progressing   Problem: Skin Integrity: Goal: Risk for impaired skin integrity will decrease Outcome: Progressing

## 2022-06-11 ENCOUNTER — Inpatient Hospital Stay: Payer: Medicare Other

## 2022-06-11 DIAGNOSIS — J121 Respiratory syncytial virus pneumonia: Secondary | ICD-10-CM | POA: Diagnosis not present

## 2022-06-11 LAB — URINALYSIS, ROUTINE W REFLEX MICROSCOPIC
Bilirubin Urine: NEGATIVE
Glucose, UA: NEGATIVE mg/dL
Hgb urine dipstick: NEGATIVE
Ketones, ur: 5 mg/dL — AB
Leukocytes,Ua: NEGATIVE
Nitrite: NEGATIVE
Protein, ur: 30 mg/dL — AB
Specific Gravity, Urine: 1.021 (ref 1.005–1.030)
pH: 5 (ref 5.0–8.0)

## 2022-06-11 LAB — BLOOD GAS, ARTERIAL
Bicarbonate: 17.5 mmol/L — ABNORMAL LOW (ref 20.0–28.0)
Bicarbonate: 17.3 mmol/L — ABNORMAL LOW (ref 20.0–28.0)
Expiratory PAP: 5 cmH2O
FIO2: 80 %
FIO2: 60 %
Inspiratory PAP: 14 cmH2O
MECHVT: 420 mL
O2 Saturation: 97.5 %
O2 Saturation: 99 %
PEEP: 5 cmH2O
pCO2 arterial: 31 mmHg — ABNORMAL LOW (ref 32–48)
pCO2 arterial: 32 mmHg (ref 32–48)
pH, Arterial: 7.36 (ref 7.35–7.45)
pH, Arterial: 7.34 — ABNORMAL LOW (ref 7.35–7.45)
pO2, Arterial: 76 mmHg — ABNORMAL LOW (ref 83–108)
pO2, Arterial: 137 mmHg — ABNORMAL HIGH (ref 83–108)

## 2022-06-11 LAB — CBC
HCT: 32.7 % — ABNORMAL LOW (ref 36.0–46.0)
Hemoglobin: 10.8 g/dL — ABNORMAL LOW (ref 12.0–15.0)
MCH: 27.7 pg (ref 26.0–34.0)
MCHC: 33 g/dL (ref 30.0–36.0)
MCV: 83.8 fL (ref 80.0–100.0)
Platelets: 223 K/uL (ref 150–400)
RBC: 3.9 MIL/uL (ref 3.87–5.11)
RDW: 13.3 % (ref 11.5–15.5)
WBC: 17 K/uL — ABNORMAL HIGH (ref 4.0–10.5)
nRBC: 0 % (ref 0.0–0.2)

## 2022-06-11 LAB — COMPREHENSIVE METABOLIC PANEL
ALT: 19 U/L (ref 0–44)
AST: 38 U/L (ref 15–41)
Albumin: 3 g/dL — ABNORMAL LOW (ref 3.5–5.0)
Alkaline Phosphatase: 63 U/L (ref 38–126)
Anion gap: 12 (ref 5–15)
BUN: 63 mg/dL — ABNORMAL HIGH (ref 8–23)
CO2: 17 mmol/L — ABNORMAL LOW (ref 22–32)
Calcium: 8.1 mg/dL — ABNORMAL LOW (ref 8.9–10.3)
Chloride: 100 mmol/L (ref 98–111)
Creatinine, Ser: 2.28 mg/dL — ABNORMAL HIGH (ref 0.44–1.00)
GFR, Estimated: 22 mL/min — ABNORMAL LOW (ref >60)
Glucose, Bld: 246 mg/dL — ABNORMAL HIGH (ref 70–99)
Potassium: 4.1 mmol/L (ref 3.5–5.1)
Sodium: 129 mmol/L — ABNORMAL LOW (ref 135–145)
Total Bilirubin: 0.8 mg/dL (ref 0.3–1.2)
Total Protein: 6.5 g/dL (ref 6.5–8.1)

## 2022-06-11 LAB — CBC WITH DIFFERENTIAL/PLATELET
Abs Immature Granulocytes: 0.08 10*3/uL — ABNORMAL HIGH (ref 0.00–0.07)
Basophils Absolute: 0 10*3/uL (ref 0.0–0.1)
Basophils Relative: 0 %
Eosinophils Absolute: 0 10*3/uL (ref 0.0–0.5)
Eosinophils Relative: 0 %
HCT: 30.8 % — ABNORMAL LOW (ref 36.0–46.0)
Hemoglobin: 10.1 g/dL — ABNORMAL LOW (ref 12.0–15.0)
Immature Granulocytes: 1 %
Lymphocytes Relative: 6 %
Lymphs Abs: 0.9 10*3/uL (ref 0.7–4.0)
MCH: 28 pg (ref 26.0–34.0)
MCHC: 32.8 g/dL (ref 30.0–36.0)
MCV: 85.3 fL (ref 80.0–100.0)
Monocytes Absolute: 1.2 10*3/uL — ABNORMAL HIGH (ref 0.1–1.0)
Monocytes Relative: 8 %
Neutro Abs: 12.3 10*3/uL — ABNORMAL HIGH (ref 1.7–7.7)
Neutrophils Relative %: 85 %
Platelets: 190 10*3/uL (ref 150–400)
RBC: 3.61 MIL/uL — ABNORMAL LOW (ref 3.87–5.11)
RDW: 13.2 % (ref 11.5–15.5)
WBC: 14.4 10*3/uL — ABNORMAL HIGH (ref 4.0–10.5)
nRBC: 0 % (ref 0.0–0.2)

## 2022-06-11 LAB — STREP PNEUMONIAE URINARY ANTIGEN: Strep Pneumo Urinary Antigen: NEGATIVE

## 2022-06-11 LAB — LACTIC ACID, PLASMA
Lactic Acid, Venous: 1.1 mmol/L (ref 0.5–1.9)
Lactic Acid, Venous: 1 mmol/L (ref 0.5–1.9)

## 2022-06-11 LAB — GLUCOSE, CAPILLARY
Glucose-Capillary: 253 mg/dL — ABNORMAL HIGH (ref 70–99)
Glucose-Capillary: 179 mg/dL — ABNORMAL HIGH (ref 70–99)
Glucose-Capillary: 119 mg/dL — ABNORMAL HIGH (ref 70–99)
Glucose-Capillary: 166 mg/dL — ABNORMAL HIGH (ref 70–99)
Glucose-Capillary: 169 mg/dL — ABNORMAL HIGH (ref 70–99)

## 2022-06-11 LAB — BASIC METABOLIC PANEL WITH GFR
Anion gap: 16 — ABNORMAL HIGH (ref 5–15)
BUN: 65 mg/dL — ABNORMAL HIGH (ref 8–23)
CO2: 16 mmol/L — ABNORMAL LOW (ref 22–32)
Calcium: 8.4 mg/dL — ABNORMAL LOW (ref 8.9–10.3)
Chloride: 100 mmol/L (ref 98–111)
Creatinine, Ser: 2.34 mg/dL — ABNORMAL HIGH (ref 0.44–1.00)
GFR, Estimated: 21 mL/min — ABNORMAL LOW (ref >60)
Glucose, Bld: 242 mg/dL — ABNORMAL HIGH (ref 70–99)
Potassium: 4 mmol/L (ref 3.5–5.1)
Sodium: 132 mmol/L — ABNORMAL LOW (ref 135–145)

## 2022-06-11 LAB — MAGNESIUM: Magnesium: 2.3 mg/dL (ref 1.7–2.4)

## 2022-06-11 LAB — PROCALCITONIN: Procalcitonin: 1.86 ng/mL

## 2022-06-11 LAB — MRSA NEXT GEN BY PCR, NASAL: MRSA by PCR Next Gen: NOT DETECTED

## 2022-06-11 MED ORDER — LACTATED RINGERS IV BOLUS
1000.0000 mL | Freq: Once | INTRAVENOUS | Status: AC
  Administered 2022-06-11: 1000 mL via INTRAVENOUS

## 2022-06-11 MED ORDER — SODIUM CHLORIDE 0.9 % IV SOLN: 250.0000 mL | INTRAVENOUS | Status: DC

## 2022-06-11 MED ORDER — ROCURONIUM BROMIDE 10 MG/ML (PF) SYRINGE: 50.0000 mg | PREFILLED_SYRINGE | Freq: Once | INTRAVENOUS | Status: AC

## 2022-06-11 MED ORDER — MIDAZOLAM HCL 2 MG/2ML IJ SOLN
2.0000 mg | INTRAMUSCULAR | Status: DC | PRN
  Administered 2022-06-11 – 2022-06-14 (×6): 2 mg via INTRAVENOUS
  Filled 2022-06-11 (×2): qty 2
  Filled 2022-06-11: qty 4
  Filled 2022-06-11 (×4): qty 2

## 2022-06-11 MED ORDER — LACTATED RINGERS IV SOLN: INTRAVENOUS | Status: DC

## 2022-06-11 MED ORDER — PROPOFOL 1000 MG/100ML IV EMUL
INTRAVENOUS | Status: AC
  Filled 2022-06-11: qty 100

## 2022-06-11 MED ORDER — ROCURONIUM BROMIDE 10 MG/ML (PF) SYRINGE
PREFILLED_SYRINGE | INTRAVENOUS | Status: AC
  Administered 2022-06-11: 50 mg via INTRAVENOUS
  Filled 2022-06-11: qty 10

## 2022-06-11 MED ORDER — ORAL CARE MOUTH RINSE
15.0000 mL | OROMUCOSAL | Status: DC
  Administered 2022-06-11 – 2022-06-14 (×39): 15 mL via OROMUCOSAL

## 2022-06-11 MED ORDER — ORAL CARE MOUTH RINSE: 15.0000 mL | OROMUCOSAL | Status: DC | PRN

## 2022-06-11 MED ORDER — DOCUSATE SODIUM 50 MG/5ML PO LIQD
100.0000 mg | Freq: Two times a day (BID) | ORAL | Status: DC
  Administered 2022-06-11 – 2022-06-14 (×7): 100 mg
  Filled 2022-06-11 (×7): qty 10

## 2022-06-11 MED ORDER — PROPOFOL 1000 MG/100ML IV EMUL
5.0000 ug/kg/min | INTRAVENOUS | Status: DC
  Administered 2022-06-11: 5 ug/kg/min via INTRAVENOUS

## 2022-06-11 MED ORDER — POLYETHYLENE GLYCOL 3350 17 G PO PACK
17.0000 g | PACK | Freq: Every day | ORAL | Status: DC
  Administered 2022-06-11 – 2022-06-14 (×4): 17 g
  Filled 2022-06-11 (×4): qty 1

## 2022-06-11 MED ORDER — ETOMIDATE 2 MG/ML IV SOLN
INTRAVENOUS | Status: AC
  Administered 2022-06-11: 20 mg via INTRAVENOUS
  Filled 2022-06-11: qty 20

## 2022-06-11 MED ORDER — ETOMIDATE 2 MG/ML IV SOLN: 20.0000 mg | Freq: Once | INTRAVENOUS | Status: AC

## 2022-06-11 MED ORDER — NOREPINEPHRINE 4 MG/250ML-% IV SOLN
2.0000 ug/min | INTRAVENOUS | Status: DC
  Administered 2022-06-11: 2 ug/min via INTRAVENOUS
  Administered 2022-06-12: 7 ug/min via INTRAVENOUS
  Filled 2022-06-11 (×3): qty 250

## 2022-06-11 MED ORDER — FENTANYL 2500MCG IN NS 250ML (10MCG/ML) PREMIX INFUSION
25.0000 ug/h | INTRAVENOUS | Status: DC
  Administered 2022-06-11: 125 ug/h via INTRAVENOUS
  Administered 2022-06-11 – 2022-06-13 (×3): 100 ug/h via INTRAVENOUS
  Filled 2022-06-11 (×4): qty 250

## 2022-06-11 MED ORDER — FENTANYL CITRATE (PF) 100 MCG/2ML IJ SOLN
INTRAMUSCULAR | Status: AC
  Filled 2022-06-11: qty 2

## 2022-06-11 NOTE — Progress Notes (Signed)
Patient oxygen saturation down to 89-90% after FiO2 decreased by RT from 60% to 50%. This RN contacted RT to update and decision made to increase FiO2 to 55% at this time. Saturation currently at 93% (Goal >92%). Appropriate pleth.

## 2022-06-11 NOTE — Progress Notes (Signed)
Daughter called to get update on patient. This RN gave update within RN scope. Daughter appreciative of the update and plans to visit today.

## 2022-06-11 NOTE — Plan of Care (Addendum)
  Problem: Activity: Goal: Ability to tolerate increased activity will improve Outcome: Not Progressing   Problem: Clinical Measurements: Goal: Ability to maintain a body temperature in the normal range will improve Outcome: Not Progressing   Problem: Respiratory: Goal: Ability to maintain adequate ventilation will improve Outcome: Not Progressing Goal: Ability to maintain a clear airway will improve Outcome: Not Progressing   Problem: Education: Goal: Ability to describe self-care measures that may prevent or decrease complications (Diabetes Survival Skills Education) will improve Outcome: Not Progressing Goal: Individualized Educational Video(s) Outcome: Not Progressing   Problem: Coping: Goal: Ability to adjust to condition or change in health will improve Outcome: Not Progressing   Problem: Fluid Volume: Goal: Ability to maintain a balanced intake and output will improve Outcome: Not Progressing   Problem: Health Behavior/Discharge Planning: Goal: Ability to identify and utilize available resources and services will improve Outcome: Not Progressing Goal: Ability to manage health-related needs will improve Outcome: Not Progressing   Problem: Metabolic: Goal: Ability to maintain appropriate glucose levels will improve Outcome: Not Progressing   Problem: Nutritional: Goal: Maintenance of adequate nutrition will improve Outcome: Not Progressing Goal: Progress toward achieving an optimal weight will improve Outcome: Not Progressing   Problem: Skin Integrity: Goal: Risk for impaired skin integrity will decrease Outcome: Not Progressing   Problem: Tissue Perfusion: Goal: Adequacy of tissue perfusion will improve Outcome: Not Progressing   Problem: Education: Goal: Knowledge of General Education information will improve Description: Including pain rating scale, medication(s)/side effects and non-pharmacologic comfort measures Outcome: Not Progressing   Problem:  Health Behavior/Discharge Planning: Goal: Ability to manage health-related needs will improve Outcome: Not Progressing   Problem: Clinical Measurements: Goal: Ability to maintain clinical measurements within normal limits will improve Outcome: Not Progressing Goal: Will remain free from infection Outcome: Not Progressing Goal: Diagnostic test results will improve Outcome: Not Progressing Goal: Respiratory complications will improve Outcome: Not Progressing Goal: Cardiovascular complication will be avoided Outcome: Not Progressing   Problem: Activity: Goal: Risk for activity intolerance will decrease Outcome: Not Progressing   Problem: Nutrition: Goal: Adequate nutrition will be maintained Outcome: Not Progressing   Problem: Coping: Goal: Level of anxiety will decrease Outcome: Not Progressing   Problem: Elimination: Goal: Will not experience complications related to bowel motility Outcome: Not Progressing Goal: Will not experience complications related to urinary retention Outcome: Not Progressing   Problem: Pain Managment: Goal: General experience of comfort will improve Outcome: Not Progressing   Problem: Safety: Goal: Ability to remain free from injury will improve Outcome: Not Progressing   Problem: Skin Integrity: Goal: Risk for impaired skin integrity will decrease Outcome: Not Progressing  Patient only responds to pain unable to make needs known total care on pressors and vent support at this time

## 2022-06-11 NOTE — Progress Notes (Signed)
Blood pressure sustaining 80s/50s MAP 50s. MD Kasa notified by this RN and ordered 1L LR bolus infusing promptly.

## 2022-06-11 NOTE — Progress Notes (Signed)
Daughter Langley Gauss called and this RN updated her within RN scope of practice. All questions answered and appreciative of the update.

## 2022-06-11 NOTE — Procedures (Signed)
Intubation Procedure Note  Jasmine Cummings  [id number]  1946-01-02  Date:06/11/22  Time:2:58 AM   Provider Performing:Jasmine Cummings A Jasmine Cummings   Procedure: Intubation (18867)  Indication(s) Respiratory Failure  Consent Unable to obtain consent due to emergent nature of procedure.  Anesthesia Etomidate and Rocuronium  Time Out Verified patient identification, verified procedure, site/side was marked, verified correct patient position, special equipment/implants available, medications/allergies/relevant history reviewed, required imaging and test results available.  Sterile Technique Usual hand hygeine, masks, and gloves were used  Procedure Description Patient positioned in bed supine.  Sedation given as noted above.  Patient was intubated with endotracheal tube using Glidescope.  View was Grade 1 full glottis .  Number of attempts was 1.  Colorimetric CO2 detector was consistent with tracheal placement.  Complications/Tolerance None; patient tolerated the procedure well. Chest X-ray is ordered to verify placement.  EBL Minimal  Specimen(s) None   Jasmine Falco, DNP, CCRN, FNP-C, AGACNP-BC Acute Care & Family Nurse Practitioner  [facility]  See Amion for personal pager PCCM on call pager [phone] until 7 am

## 2022-06-11 NOTE — Progress Notes (Signed)
Attempted to contact patient's daughter Ulyses Southward to update on change in status requiring emrgent intubation for airway protection. No answer, left a voice mail to call back for updates.  [name], DNP, CCRN, FNP-C, AGACNP-BC Acute Care & Family Nurse Practitioner  [facility]  See Amion for personal pager PCCM on call pager [phone] until 7 am

## 2022-06-11 NOTE — Progress Notes (Signed)
[place], [place] 06/11/22  Subjective:   Hospital day # 4  Patient with worsening respiratory status. Currently on the ventilator. Creatinine currently 2.2.  Renal: 12/09 0701 - 12/10 0700 In: 1231.7 [I.V.:786.8; IV Piggyback:444.9] Out: 800 [Urine:800] Lab Results  Component Value Date   CREATININE 2.28 (H) 06/11/2022   CREATININE 2.34 (H) 06/11/2022   CREATININE 2.40 (H) 06/10/2022     Objective:  Vital signs in last 24 hours:  Temp:  [98.2 F (36.8 C)-98.6 F (37 C)] 98.3 F (36.8 C) (12/10 0737) Pulse Rate:  [85-127] 87 (12/10 0900) Resp:  [17-37] 26 (12/10 0900) BP: (98-142)/(61-86) 98/61 (12/10 0900) SpO2:  [87 %-96 %] 96 % (12/10 0900) FiO2 (%):  [50 %-60 %] 60 % (12/10 0757) Weight:  [60 kg] 60 kg (12/10 0500)  Weight change:  Filed Weights   06/23/2022 1206 06/11/22 0500  Weight: 53.1 kg 60 kg    Intake/Output:    Intake/Output Summary (Last 24 hours) at 06/11/2022 0951 Last data filed at 06/11/2022 0745 Gross per 24 hour  Intake 1255.21 ml  Output 800 ml  Net 455.21 ml     Physical Exam: General: Critically ill-appearing  HEENT Dry oral mucous membranes  Pulm/lungs Mild basilar coarse breath sounds, vent assisted  CVS/Heart No rub  Abdomen:  Soft, nontender  Extremities: Bilateral lower extremity edema  Neurologic: Currently intubated  Skin: No acute rashes          Basic Metabolic Panel:  Recent Labs  Lab 06/09/22 0532 06/10/22 0532 06/10/22 1930 06/11/22 0420 06/11/22 0546  NA 131* 131* 130* 132* 129*  K 4.0 3.7 4.0 4.0 4.1  CL 104 103 101 100 100  CO2 19* 18* 16* 16* 17*  GLUCOSE 160* 132* 247* 242* 246*  BUN 54* 52* 64* 65* 63*  CREATININE 1.97* 1.95* 2.40* 2.34* 2.28*  CALCIUM 8.4* 8.2* 8.4* 8.4* 8.1*  MG 2.0 2.1 2.2 2.3  --       CBC: Recent Labs  Lab 06/23/2022 1213 06/08/22 0459 06/09/22 0532 06/10/22 0532 06/10/22 1930 06/11/22 0420 06/11/22 0546  WBC 7.3   < > 8.6 9.2  13.2* 17.0* 14.4*  NEUTROABS 5.0  --   --   --  11.6*  --  12.3*  HGB 10.4*   < > 10.2* 10.4* 10.2* 10.8* 10.1*  HCT 32.4*   < > 30.7* 31.3* 31.1* 32.7* 30.8*  MCV 86.6   < > 84.3 84.6 85.0 83.8 85.3  PLT 151   < > 176 182 212 223 190   < > = values in this interval not displayed.      No results found for: "HEPBSAG", "HEPBSAB", "HEPBIGM"    Microbiology:  Recent Results (from the past 240 hour(s))  Resp panel by RT-PCR (RSV, Flu A&B, Covid) Anterior Nasal Swab     Status: Abnormal   Collection Time: 06/09/2022 12:13 PM   Specimen: Anterior Nasal Swab  Result Value Ref Range Status   SARS Coronavirus 2 by RT PCR NEGATIVE NEGATIVE Final    Comment: (NOTE) SARS-CoV-2 target nucleic acids are NOT DETECTED.  The SARS-CoV-2 RNA is generally detectable in upper respiratory specimens during the acute phase of infection. The lowest concentration of SARS-CoV-2 viral copies this assay can detect is 138 copies/mL. A negative result does not preclude SARS-Cov-2 infection and should not be used as the sole basis for treatment or other patient management decisions. A negative result may occur with  improper specimen collection/handling, submission of specimen  other than nasopharyngeal swab, presence of viral mutation(s) within the areas targeted by this assay, and inadequate number of viral copies(<138 copies/mL). A negative result must be combined with clinical observations, patient history, and epidemiological information. The expected result is Negative.  Fact Sheet for Patients:  EntrepreneurPulse.com.au  Fact Sheet for Healthcare Providers:  IncredibleEmployment.be  This test is no t yet approved or cleared by the [place] FDA and  has been authorized for detection and/or diagnosis of SARS-CoV-2 by FDA under an Emergency Use Authorization (EUA). This EUA will remain  in effect (meaning this test can be used) for the duration of the COVID-19  declaration under Section 564(b)(1) of the Act, 21 U.S.C.section 360bbb-3(b)(1), unless the authorization is terminated  or revoked sooner.       Influenza A by PCR NEGATIVE NEGATIVE Final   Influenza B by PCR NEGATIVE NEGATIVE Final    Comment: (NOTE) The Xpert Xpress SARS-CoV-2/FLU/RSV plus assay is intended as an aid in the diagnosis of influenza from Nasopharyngeal swab specimens and should not be used as a sole basis for treatment. Nasal washings and aspirates are unacceptable for Xpert Xpress SARS-CoV-2/FLU/RSV testing.  Fact Sheet for Patients: EntrepreneurPulse.com.au  Fact Sheet for Healthcare Providers: IncredibleEmployment.be  This test is not yet approved or cleared by the [place] FDA and has been authorized for detection and/or diagnosis of SARS-CoV-2 by FDA under an Emergency Use Authorization (EUA). This EUA will remain in effect (meaning this test can be used) for the duration of the COVID-19 declaration under Section 564(b)(1) of the Act, 21 U.S.C. section 360bbb-3(b)(1), unless the authorization is terminated or revoked.     Resp Syncytial Virus by PCR POSITIVE (A) NEGATIVE Final    Comment: (NOTE) Fact Sheet for Patients: EntrepreneurPulse.com.au  Fact Sheet for Healthcare Providers: IncredibleEmployment.be  This test is not yet approved or cleared by the [place] FDA and has been authorized for detection and/or diagnosis of SARS-CoV-2 by FDA under an Emergency Use Authorization (EUA). This EUA will remain in effect (meaning this test can be used) for the duration of the COVID-19 declaration under Section 564(b)(1) of the Act, 21 U.S.C. section 360bbb-3(b)(1), unless the authorization is terminated or revoked.  Performed at [facility], [address] [place], [place] [address]     Coagulation Studies: No results for input(s): "LABPROT", "INR" in the last  72 hours.  Urinalysis: Recent Labs    06/11/22 0052  COLORURINE YELLOW*  LABSPEC 1.021  PHURINE 5.0  GLUCOSEU NEGATIVE  HGBUR NEGATIVE  BILIRUBINUR NEGATIVE  KETONESUR 5*  PROTEINUR 30*  NITRITE NEGATIVE  LEUKOCYTESUR NEGATIVE      Imaging: DG Abd 1 View  Result Date: 06/11/2022 CLINICAL DATA:  Nasogastric tube placement EXAM: ABDOMEN - 1 VIEW COMPARISON:  None Available. FINDINGS: Nasogastric tube tip overlies expected mid body of the stomach. The abdominal gas pattern is indeterminate due to a paucity of intra-abdominal gas. Small bilateral pleural effusions are present. Interstitial pulmonary infiltrates are seen at the right lung base. Vascular calcifications noted within the abdomen. IMPRESSION: 1. Nasogastric tube tip within the mid body of the stomach. Electronically Signed   By: Fidela Salisbury M.D.   On: 06/11/2022 03:18   DG Chest 1 View  Result Date: 06/11/2022 CLINICAL DATA:  Respiratory failure, intubation EXAM: CHEST  1 VIEW COMPARISON:  7:55 p.m. FINDINGS: Endotracheal tube seen 2 cm above the carina. The lungs are symmetrically well expanded and pulmonary insufflation is preserved. Small bilateral pleural effusions are present. Multifocal  interstitial airspace infiltrates are again seen, more focal within the right perihilar region, stable since prior examination. No pneumothorax. Cardiac size within normal limits. No acute bone abnormality. IMPRESSION: 1. Endotracheal tube 2 cm above the carina. 2. Stable multifocal pulmonary infiltrate. 3. Small bilateral pleural effusions. Electronically Signed   By: Fidela Salisbury M.D.   On: 06/11/2022 03:17   DG Chest Port 1 View  Result Date: 06/10/2022 CLINICAL DATA:  Hypoxia. EXAM: PORTABLE CHEST 1 VIEW COMPARISON:  Radiograph and CT 06/18/2022 FINDINGS: Significant progression in bilateral airspace disease, more so on the right. Enlarging bilateral pleural effusions. Associated bibasilar consolidations likely represent  compressive atelectasis. Suspected septal thickening can be seen with pulmonary edema. Heart is prominent in size. No pneumothorax. IMPRESSION: 1. Significant progression in bilateral airspace disease, more so on the right. There also septal thickening, findings are suspicious for pulmonary edema, although multifocal pneumonia is also considered. 2. Enlarging bilateral pleural effusions. Bibasilar consolidations likely compressive atelectasis related to pleural effusions. Electronically Signed   By: Keith Rake M.D.   On: 06/10/2022 20:02     Medications:    ceFEPime (MAXIPIME) IV Stopped (06/11/22 0044)   fentaNYL infusion INTRAVENOUS 100 mcg/hr (06/11/22 0745)   promethazine (PHENERGAN) injection (IM or IVPB) Stopped (06/10/22 1214)   propofol (DIPRIVAN) infusion 15 mcg/kg/min (06/11/22 0753)   propofol     [START ON 06/13/2022] vancomycin      albuterol  2.5 mg Nebulization BID   amLODipine  10 mg Oral QHS   atorvastatin  20 mg Oral Daily   brimonidine  1 drop Right Eye BID   Chlorhexidine Gluconate Cloth  6 each Topical Daily   docusate  100 mg Per Tube BID   enoxaparin (LOVENOX) injection  30 mg Subcutaneous Q24H   FLUoxetine  40 mg Oral q morning   insulin aspart  0-15 Units Subcutaneous TID WC   insulin aspart  0-5 Units Subcutaneous QHS   insulin aspart  3 Units Subcutaneous TID WC   insulin glargine-yfgn  9 Units Subcutaneous QHS   metoprolol tartrate  50 mg Oral BID   multivitamin with minerals  1 tablet Oral Daily   mycophenolate  180 mg Oral QHS   mycophenolate  360 mg Oral Daily   pantoprazole  40 mg Oral Daily   polyethylene glycol  17 g Per Tube Daily   tacrolimus  1 mg Oral QHS   tacrolimus  2 mg Oral Daily   acetaminophen **OR** acetaminophen, albuterol, guaiFENesin-dextromethorphan, hydrOXYzine, ondansetron **OR** ondansetron (ZOFRAN) IV, promethazine (PHENERGAN) injection (IM or IVPB), propofol  Assessment/ Plan:  76 y.o. female with  diabetes type 2,  paroxysmal atrial fibrillation, mild cognitive deficits and kidney transplant in 2013, who was  admitted on 07/01/2022 for Hypoxia [R09.02] RSV infection [B33.8] Pneumonia due to respiratory syncytial virus (RSV) [J12.1] Community acquired pneumonia, unspecified laterality [J18.9]  1. 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  Renal transplant status.  Patient states baseline creatinine 1.2.  Currently followed by [facility] in [place].  Patient has received deceased donor renal transplant in 2013 at [facility].  Kidney failure related to diabetes.  Continue home immunosuppressive regimen which includes, mycophenolate 360 mg in the morning and 180 mg at night and tacrolimus 2 mg in the morning and 1 mg at night.  Monitor renal function trend closely.   2.  Hyponatremia, likely due to poor oral intake.  Sodium down again to 129.  Continue IV fluid hydration.   3.  Acute metabolic acidosis.  Likely secondary to acute kidney injury.  Serum bicarbonate currently 17 and slightly better.  Continue to monitor serum bicarbonate levels.   4.  Acute respiratory failure/pneumonia due to RSV.  IV antibiotics managed by primary team, currently on cefepime.  Now intubated.      LOS: 4 Ajene Carchi 12/10/20239:51 AM  [place], [place]  Note: This note was prepared with Dragon dictation. Any transcription errors are unintentional

## 2022-06-11 NOTE — Consult Note (Signed)
NAME:  Jasmine Cummings, MRN:  [id number], DOB:  11-21-1945, LOS: 4 ADMISSION DATE:  06/18/2022, CONSULTATION DATE:  06/11/2022 REFERRING MD:  Enzo Bi  CHIEF COMPLAINT: Respiratory Failure   HPI  76 y.o female with significant PMH of Kidney transplant (2013) on immunosuppressants, MCI, PAF, T2DM, and HTN who presented to the ED with chief complaints of C/O two weeks of body aches, c/o chills, cough - non productive. nausea x 2 days and left sided pain x 1 month. Sates exposed to RSV and flu by grandson.   ED Course: Initial vital signs showed HR of 65 beats/minute, BP 132/65 mm Hg, the RR 16 breaths/minute, and the oxygen saturation 92 % on and a temperature of 98.22F (36.9C).   Pertinent Labs/Diagnostics Findings: Chemistry:Na+/ K+: 129/4.4  Glucose: 293 BUN/Cr: 34/1.49  CBC: Unremarkable Other Lab findings: PCT: 1.43  COVID PCR: Negative,  RSV: Positive  Imaging: CTA Chest Abd/pelvis>1. Small bilateral pleural effusions. 2. Patchy areas of airspace disease as above, most pronounced within the lower lobes and right upper lobe. Favor multifocal pneumonia given clinical presentation. Continued follow up after appropriate medical management is recommended to document resolution. 3. Right lower quadrant transplant kidney, with nonobstructing renal calculi measuring up to 7 mm.  Hospital Course: Patient given 30 cc/kg of fluids and started on broad-spectrum antibiotics Ceftriaxone and Azithromycin for suspected sepsis secondary. Patient admitted to [facility] unit under hospitalist service. See significant events below.  Past Medical History  Kidney transplant (2013) on immunosuppressants, MCI, PAF, T2DM, and HTN   Significant Hospital Events   12/6: Admitted to [facility] unit with acute hypoxic respiratory failure secondary to RSV pneumonia 12/8: Nephrology consulted due to ESRD and history of deceased donor kidney transplant 12/8: Rapid response called for respiratory distress with tachypnea,  use of assessor muscle and hypoxia 74% on 4 L.  Patient placed on BiPAP and transferred to the ICU. 12/9: Remained on BiPAP, however with worsening respiratory distress, increased work of breathing and JVD, BiPAP setting adjusted gradually with prior to up to 100% with sats still in the 80s.  Due to worsening respiratory failure and now patient altered with increased work of breathing patient was emergently intubated for airway protection.  Consults:  Nephrology  Procedures:  12/9: Intubation  Significant Diagnostic Tests:  12/9 CXR IMPRESSION: 1. Significant progression in bilateral airspace disease, more so on the right. There also septal thickening, findings are suspicious for pulmonary edema, although multifocal pneumonia is also considered. 2. Enlarging bilateral pleural effusions. Bibasilar consolidations likely compressive atelectasis related to pleural effusions.  12/6: CT Chest, abdomen and pelvis> IMPRESSION: 1. Small bilateral pleural effusions. 2. Patchy areas of airspace disease as above, most pronounced within the lower lobes and right upper lobe. Favor multifocal pneumonia given clinical presentation. Continued follow up after appropriate medical management is recommended to document resolution. 3. Right lower quadrant transplant kidney, with nonobstructing renal calculi measuring up to 7 mm. 4. Calcification of the aortic and mitral valves. 5. Aortic Atherosclerosis (ICD10-I70.0). Coronary artery atherosclerosis.  Micro Data:  12/6: SARS-CoV-2 PCR> negative 12/6: Influenza PCR> negative 12/6: RSV> POSITIVE 12/9: Blood culture x2> 12/9: MRSA PCR>>  12/10: Strep pneumo urinary antigen> 12/10: Legionella urinary antigen>  Antimicrobials:  Vancomycin 12/10> Cefepime 12/10>  OBJECTIVE  Blood pressure 132/76, pulse (!) 104, temperature 98.2 F (36.8 C), temperature source Axillary, resp. rate (!) 32, height 5' 7" (1.702 m), weight 53.1 kg, SpO2 96 %.    FiO2  (%):  [50 %-60 %] 60 %  Intake/Output Summary (Last 24 hours) at 06/11/2022 0302 Last data filed at 06/11/2022 0100 Gross per 24 hour  Intake 1199.58 ml  Output 550 ml  Net 649.58 ml   Filed Weights   06/23/2022 1206  Weight: 53.1 kg   Physical Examination  GENERAL: 76 year-old critically ill patient lying in the bed intubated and sedated EYES: Pupils equal, round, reactive to light and accommodation. No scleral icterus. Extraocular muscles intact.  HEENT: Head atraumatic, normocephalic. Oropharynx and nasopharynx clear.  NECK:  Supple, Significant jugular venous distention. No thyroid enlargement, no tenderness.  LUNGS: Decreased breath sounds bilaterally, no wheezing, moderate rales, crackles and rhonchi. use of accessory muscles of respiration.  CARDIOVASCULAR: S1, S2 normal. No murmurs, rubs, or gallops.  ABDOMEN: Soft, nontender, nondistended. Bowel sounds present. No organomegaly or mass.  EXTREMITIES: Upper and lower extremities are atraumatic in appearance without tenderness or deformity. Lower extremities Bilateral pitting edema  Capillary refill is less than 3 seconds in all extremities. Pulses palpable.  NEUROLOGIC:The patient is intubated and sedated. Cranial nerves are intact.. Gait not checked.  PSYCHIATRIC The patient is intubated and sedated SKIN: No obvious rash, lesion, or ulcer.   Labs/imaging that I havepersonally reviewed  (right click and "Reselect all SmartList Selections" daily)     Labs   CBC: Recent Labs  Lab 06/06/2022 1213 06/08/22 0459 06/09/22 0532 06/10/22 0532 06/10/22 1930  WBC 7.3 7.3 8.6 9.2 13.2*  NEUTROABS 5.0  --   --   --  11.6*  HGB 10.4* 9.7* 10.2* 10.4* 10.2*  HCT 32.4* 31.5* 30.7* 31.3* 31.1*  MCV 86.6 89.2 84.3 84.6 85.0  PLT 151 159 176 182 622    Basic Metabolic Panel: Recent Labs  Lab 06/20/2022 2012 06/08/22 0459 06/09/22 0532 06/10/22 0532 06/10/22 1930  NA 129* 128* 131* 131* 130*  K 4.4 4.5 4.0 3.7 4.0  CL 101 100  104 103 101  CO2 16* 15* 19* 18* 16*  GLUCOSE 335* 399* 160* 132* 247*  BUN 38* 48* 54* 52* 64*  CREATININE 1.48* 1.81* 1.97* 1.95* 2.40*  CALCIUM 7.6* 7.9* 8.4* 8.2* 8.4*  MG  --   --  2.0 2.1 2.2   GFR: Estimated Creatinine Clearance: 16.5 mL/min (A) (by C-G formula based on SCr of 2.4 mg/dL (H)). Recent Labs  Lab 06/04/2022 2212 06/08/22 0459 06/09/22 0532 06/10/22 0532 06/10/22 1930 06/10/22 2220  PROCALCITON 1.43  --   --   --  1.80  --   WBC  --  7.3 8.6 9.2 13.2*  --   LATICACIDVEN  --   --   --   --  3.1* 1.7    Liver Function Tests: Recent Labs  Lab 06/13/2022 1213 06/10/22 1930  AST 23 36  ALT 13 21  ALKPHOS 63 67  BILITOT 1.1 0.6  PROT 7.1 7.1  ALBUMIN 3.4* 3.4*   Recent Labs  Lab 06/06/2022 1213  LIPASE 27   No results for input(s): "AMMONIA" in the last 168 hours.  ABG    Component Value Date/Time   HCO3 17.3 (L) 06/10/2022 2133   ACIDBASEDEF 7.4 (H) 06/10/2022 2133   O2SAT 81.6 06/10/2022 2133     Coagulation Profile: No results for input(s): "INR", "PROTIME" in the last 168 hours.  Cardiac Enzymes: No results for input(s): "CKTOTAL", "CKMB", "CKMBINDEX", "TROPONINI" in the last 168 hours.  HbA1C: Hemoglobin A1C  Date/Time Value Ref Range Status  12/11/2013 04:47 AM 8.3 (H) 4.2 - 6.3 % Final    Comment:  The American Diabetes Association recommends that a primary goal of therapy should be <7% and that physicians should reevaluate the treatment regimen in patients with HbA1c values consistently >8%.    Hgb A1c MFr Bld  Date/Time Value Ref Range Status  06/06/2022 12:13 PM 7.3 (H) 4.8 - 5.6 % Final    Comment:    (NOTE)         Prediabetes: 5.7 - 6.4         Diabetes: >6.4         Glycemic control for adults with diabetes: <7.0     CBG: Recent Labs  Lab 06/10/22 0757 06/10/22 1136 06/10/22 1706 06/10/22 1928 06/10/22 2202  GLUCAP 155* 192* 248* 230* 223*    Review of Systems:   UNABLE TO OBTAIN PATIENT INTUBATED AND  SEDATED  Past Medical History  She,  has a past medical history of Anemia, Anxiety, Arthritis, Chronic kidney disease, Depression, Diabetes mellitus, Diabetic retinopathy ([place]), GERD (gastroesophageal reflux disease), Hypertension, Memory impairment, Obsessive compulsive disorder, Osteopenia (03/2018), and Thrombocytopenia ([place]) (05/31/2015).   Surgical History    Past Surgical History:  Procedure Laterality Date   APPENDECTOMY     BREAST EXCISIONAL BIOPSY Bilateral    CATARACT EXTRACTION     CESAREAN SECTION  1968   KIDNEY TRANSPLANT Right 09/2011   RETINAL DETACHMENT SURGERY Left 2006   TUBAL LIGATION  1981     Social History   reports that she has never smoked. She has never used smokeless tobacco. She reports that she does not drink alcohol and does not use drugs.   Family History   Her family history includes Breast cancer (age of onset: 58) in her sister; Diabetes in her brother and brother; Heart disease in her brother and sister; Stroke in her mother.   Allergies Allergies  Allergen Reactions   Latex Hives    REACTION: swelling Other reaction(s): Unknown   Other Other (See Comments)    [DERM]   Silicone     Other reaction(s): Other (See Comments) [DERM]    Home Medications  Prior to Admission medications   Medication Sig Start Date End Date Taking? Authorizing Provider  ALPRAZolam (XANAX) 0.25 MG tablet one po bid Oral   Yes [provider]  amLODipine (NORVASC) 10 MG tablet Take 10 mg by mouth at bedtime.   Yes [provider]  atorvastatin (LIPITOR) 20 MG tablet Take 20 mg by mouth daily. 04/26/22  Yes [provider]  brimonidine (ALPHAGAN) 0.2 % ophthalmic solution Place 1 drop into the right eye 2 (two) times daily. 07/20/21  Yes [provider]  Continuous Blood Gluc Sensor (FREESTYLE LIBRE SENSOR SYSTEM) MISC FreeStyle Libre 14 Day Sensor kit  CHANGE EVERY 14 DAYS DX E10.29   Yes [provider]  FLUoxetine  (PROZAC) 40 MG capsule Take 40 mg by mouth every morning.    Yes [provider]  insulin aspart (NOVOLOG) 100 UNIT/ML injection Inject 12 Units into the skin 4 (four) times daily. Sliding Scale   Yes [provider]  Insulin Glargine (LANTUS [place]) Inject 9 Units into the skin daily. 9 units in AM   Yes [provider]  metoprolol tartrate (LOPRESSOR) 25 MG tablet Take 50 mg by mouth 2 (two) times daily.  01/30/13  Yes [provider]  Multiple Vitamin (MULTIVITAMIN) capsule Take 1 capsule by mouth daily.     Yes [provider]  mycophenolate (MYFORTIC) 180 MG EC tablet Take 180-360 mg by mouth 2 (two)  times daily. Takes 2 in the morning and 1 at night   Yes [provider]  NOVOFINE 32G X 6 MM MISC as needed. Reported on 01/06/2016 03/11/13  Yes [provider]  omeprazole (PRILOSEC) 20 MG capsule Take 20 mg by mouth daily. 01/16/13  Yes [provider]  tacrolimus (PROGRAF) 1 MG capsule Take 1-2 mg by mouth 2 (two) times daily. Takes 2 in the morning and 1 at night   Yes [provider]  zinc gluconate 50 MG tablet Take 50 mg by mouth daily.   Yes [provider]  Scheduled Meds:  albuterol  2.5 mg Nebulization BID   amLODipine  10 mg Oral QHS   atorvastatin  20 mg Oral Daily   brimonidine  1 drop Right Eye BID   Chlorhexidine Gluconate Cloth  6 each Topical Daily   docusate  100 mg Per Tube BID   enoxaparin (LOVENOX) injection  30 mg Subcutaneous Q24H   FLUoxetine  40 mg Oral q morning   insulin aspart  0-15 Units Subcutaneous TID WC   insulin aspart  0-5 Units Subcutaneous QHS   insulin aspart  3 Units Subcutaneous TID WC   insulin glargine-yfgn  9 Units Subcutaneous QHS   metoprolol tartrate  50 mg Oral BID   multivitamin with minerals  1 tablet Oral Daily   mycophenolate  180 mg Oral QHS   mycophenolate  360 mg Oral Daily   pantoprazole  40 mg Oral Daily   polyethylene glycol  17 g Per Tube Daily    tacrolimus  1 mg Oral QHS   tacrolimus  2 mg Oral Daily   Continuous Infusions:  ceFEPime (MAXIPIME) IV Stopped (06/11/22 0044)   fentaNYL infusion INTRAVENOUS     promethazine (PHENERGAN) injection (IM or IVPB) Stopped (06/10/22 1214)   propofol (DIPRIVAN) infusion 5 mcg/kg/min (06/11/22 0343)   propofol     [START ON 06/13/2022] vancomycin     PRN Meds:.acetaminophen **OR** acetaminophen, albuterol, guaiFENesin-dextromethorphan, hydrOXYzine, ondansetron **OR** ondansetron (ZOFRAN) IV, promethazine (PHENERGAN) injection (IM or IVPB), propofol  Active Hospital Problem list   See below  Assessment & Plan:   Acute Hypoxic Respiratory Failure due to Severe RSV Multifocal Pneumonia -Wean PEEP and FiO2 for sats greater than 90% -Plateau pressures less than 30 cm H20 -Con't LTVV,  -Daily SBT -VAP bundle in place -Intermittent chest x-ray & ABG -F/u cultures, trend PCT -Continue Pna coverage -wean sedation/analgesia for RASS goal 0  Sepsis in the setting of suspected Aspiration Meets SIRS Criteria  -Monitor fever curve -Trend WBC's & Procalcitonin -Follow cultures as above -Continue empiric Vancomycin and Cefepime pending cultures & sensitivities   AKI on CKD stage III, likely ATN in the setting of above Hyponatremia Acute metabolic acidosis PMH x: Deceased donor renal transplant (2013) -Monitor I&O's / urinary output -Follow BMP -Ensure adequate renal perfusion -Avoid nephrotoxic agents as able -Replace electrolytes as indicated -Pharmacy following for assistance with electrolyte replacement -Nephrology consulted, appreciate input  PAF (paroxysmal atrial fibrillation) ([place]) Patient not currently anticoagulated -rate controlled on metoprolol   Acute Metabolic Encephalopathy ~ Hx: MCI -Provide supportive care -Promote normal sleep/wake cycle -Avoid sedating meds as able -CT Head if neuro status worsens   Diabetes mellitus HgbA1c  -CBG's AC & hs; Target range of  140 to 180 -SSI -Follow ICU Hypo/Hyperglycemia protocol   Best practice:  Diet:  NPO Pain/Anxiety/Delirium protocol (if indicated): Yes (RASS goal -1) VAP protocol (if indicated): Yes DVT prophylaxis: LMWH GI prophylaxis: N/A Glucose  control:  SSI Yes Central venous access:  N/A Arterial line:  N/A Foley:  Yes, and it is still needed Mobility:  bed rest  PT consulted: N/A Last date of multidisciplinary goals of care discussion [12/10] Code Status:  full code Disposition: ICU   = Goals of Care = Code Status Order: FULL  Primary Emergency Contact: Parker,Denice, Home Phone: [phone] Wishes to pursue full aggressive treatment and intervention options, including CPR and intubation, but goals of care will be addressed on going with family if that should become necessary.  Critical care time: 45 minutes       [name], DNP, CCRN, FNP-C, AGACNP-BC Acute Care Nurse Practitioner [facility]  PCCM on call pager [phone] until 7 am

## 2022-06-11 NOTE — Progress Notes (Addendum)
Levo started at 42mg/min for hypotension. IV watch in place and monitoring. Versed given per MD Kasa for lower and upper extremity twitching.

## 2022-06-11 NOTE — Progress Notes (Signed)
An USGPIV (ultrasound guided PIV) has been placed for short-term vasopressor infusion. A correctly placed ivWatch must be used when administering Vasopressors. Should this treatment be needed beyond 72 hours, central line access should be obtained.  It will be the responsibility of the bedside nurse to follow best practice to prevent extravasations.  HS Elfriede Bonini RN 

## 2022-06-11 NOTE — Progress Notes (Signed)
[facility] for Electrolyte Monitoring and Replacement   Recent Labs: Potassium  Date Value  06/11/2022 4.1 mmol/L  02/02/2016 4.5 mEq/L   Magnesium (mg/dL)  Date Value  06/11/2022 2.3  12/11/2013 2.3   Calcium (mg/dL)  Date Value  06/11/2022 8.1 (L)  02/02/2016 9.3   Albumin (g/dL)  Date Value  06/11/2022 3.0 (L)  02/02/2016 4.0   Phosphorus (mg/dL)  Date Value  09/15/2010 5.3 (H)   Sodium  Date Value  06/11/2022 129 mmol/L (L)  02/02/2016 136 mEq/L    Assessment: 76 year old female with past medical history of kidney transplant 2013 (on immunosuppressants- tacrolimus and mycophenolate), dementia, paroxysmal atrial fibrillation, and insulin dependent T2DM. Admitted with +RSV with superimposed bacterial pneumonia ISO immunocompromised state, acute on chronic hyponatremia secondary to decreased oral intake and AKI with metabolic acidosis. Currently intubated on 50% FiO2 and sedated on propofol. Receiving pressor support with norepinephrine @ 2 mcg/min. Pharmacy has been consulted to manage electrolytes while patient is under PCCM care.  Goal of Therapy:  K > 4 Mag > 2 Na to baseline (nephrology consulted) All other electrolytes within normal limits  Plan:  No replacement warranted Restart LR '@50'$  mL/hr (per nephrology & PCCM) Follow up labs tomorrow AM    Glean Salvo, PharmD, BCPS Clinical Pharmacist  06/11/2022 3:50 PM

## 2022-06-11 NOTE — Progress Notes (Signed)
Increased work of breathing and had to increase FIO2 on bipap, notified NP Randol Kern, Stopped IV fluids per NP, will continue to monitor.

## 2022-06-11 NOTE — Progress Notes (Signed)
CCMD called to report 6 beat run Vtach. Patient asymptomatic in bed on sedation and ventilator.

## 2022-06-11 NOTE — Progress Notes (Signed)
No urine out put since transfer to our floor, bladder scan conducted, noted 685m's of urine per scan, in and out catheter placed obtained 550 ml's of urine back. Notified NP MRandol Kern

## 2022-06-12 ENCOUNTER — Inpatient Hospital Stay: Payer: Medicare Other

## 2022-06-12 DIAGNOSIS — J121 Respiratory syncytial virus pneumonia: Secondary | ICD-10-CM | POA: Diagnosis not present

## 2022-06-12 LAB — CBC
HCT: 31.8 % — ABNORMAL LOW (ref 36.0–46.0)
Hemoglobin: 10.2 g/dL — ABNORMAL LOW (ref 12.0–15.0)
MCH: 27.9 pg (ref 26.0–34.0)
MCHC: 32.1 g/dL (ref 30.0–36.0)
MCV: 86.9 fL (ref 80.0–100.0)
Platelets: 282 10*3/uL (ref 150–400)
RBC: 3.66 MIL/uL — ABNORMAL LOW (ref 3.87–5.11)
RDW: 13.8 % (ref 11.5–15.5)
WBC: 17 10*3/uL — ABNORMAL HIGH (ref 4.0–10.5)
nRBC: 0 % (ref 0.0–0.2)

## 2022-06-12 LAB — GLUCOSE, CAPILLARY
Glucose-Capillary: 128 mg/dL — ABNORMAL HIGH (ref 70–99)
Glucose-Capillary: 137 mg/dL — ABNORMAL HIGH (ref 70–99)
Glucose-Capillary: 145 mg/dL — ABNORMAL HIGH (ref 70–99)
Glucose-Capillary: 146 mg/dL — ABNORMAL HIGH (ref 70–99)
Glucose-Capillary: 158 mg/dL — ABNORMAL HIGH (ref 70–99)
Glucose-Capillary: 159 mg/dL — ABNORMAL HIGH (ref 70–99)
Glucose-Capillary: 161 mg/dL — ABNORMAL HIGH (ref 70–99)
Glucose-Capillary: 197 mg/dL — ABNORMAL HIGH (ref 70–99)

## 2022-06-12 LAB — BASIC METABOLIC PANEL
Anion gap: 11 (ref 5–15)
BUN: 68 mg/dL — ABNORMAL HIGH (ref 8–23)
CO2: 19 mmol/L — ABNORMAL LOW (ref 22–32)
Calcium: 8.2 mg/dL — ABNORMAL LOW (ref 8.9–10.3)
Chloride: 102 mmol/L (ref 98–111)
Creatinine, Ser: 3.17 mg/dL — ABNORMAL HIGH (ref 0.44–1.00)
GFR, Estimated: 15 mL/min — ABNORMAL LOW (ref >60)
Glucose, Bld: 150 mg/dL — ABNORMAL HIGH (ref 70–99)
Potassium: 4.1 mmol/L (ref 3.5–5.1)
Sodium: 132 mmol/L — ABNORMAL LOW (ref 135–145)

## 2022-06-12 LAB — PROCALCITONIN: Procalcitonin: 2.8 ng/mL

## 2022-06-12 LAB — VANCOMYCIN, RANDOM: Vancomycin Rm: 10 ug/mL

## 2022-06-12 LAB — MAGNESIUM: Magnesium: 2.3 mg/dL (ref 1.7–2.4)

## 2022-06-12 LAB — TRIGLYCERIDES: Triglycerides: 131 mg/dL (ref <150)

## 2022-06-12 LAB — LEGIONELLA PNEUMOPHILA SEROGP 1 UR AG: L. pneumophila Serogp 1 Ur Ag: NEGATIVE

## 2022-06-12 MED ORDER — RENA-VITE PO TABS
1.0000 | ORAL_TABLET | Freq: Every day | ORAL | Status: DC
  Administered 2022-06-12 – 2022-06-13 (×2): 1
  Filled 2022-06-12 (×2): qty 1

## 2022-06-12 MED ORDER — NOREPINEPHRINE 16 MG/250ML-% IV SOLN
0.0000 ug/min | INTRAVENOUS | Status: DC
  Administered 2022-06-12: 2 ug/min via INTRAVENOUS
  Administered 2022-06-13: 12 ug/min via INTRAVENOUS
  Administered 2022-06-14: 15 ug/min via INTRAVENOUS
  Filled 2022-06-12 (×5): qty 250

## 2022-06-12 MED ORDER — FREE WATER
30.0000 mL | Status: DC
  Administered 2022-06-12 – 2022-06-14 (×14): 30 mL

## 2022-06-12 MED ORDER — INSULIN ASPART 100 UNIT/ML IJ SOLN
0.0000 [IU] | INTRAMUSCULAR | Status: DC
  Administered 2022-06-12 (×2): 1 [IU] via SUBCUTANEOUS
  Administered 2022-06-12: 2 [IU] via SUBCUTANEOUS
  Administered 2022-06-13: 3 [IU] via SUBCUTANEOUS
  Administered 2022-06-13: 1 [IU] via SUBCUTANEOUS
  Administered 2022-06-13: 3 [IU] via SUBCUTANEOUS
  Administered 2022-06-13: 2 [IU] via SUBCUTANEOUS
  Administered 2022-06-13: 5 [IU] via SUBCUTANEOUS
  Administered 2022-06-13: 2 [IU] via SUBCUTANEOUS
  Administered 2022-06-14 (×2): 7 [IU] via SUBCUTANEOUS
  Administered 2022-06-14: 5 [IU] via SUBCUTANEOUS
  Filled 2022-06-12 (×10): qty 1

## 2022-06-12 MED ORDER — VITAL AF 1.2 CAL PO LIQD
1000.0000 mL | ORAL | Status: DC
  Administered 2022-06-12: 1000 mL

## 2022-06-12 NOTE — Progress Notes (Addendum)
Initial Nutrition Assessment  DOCUMENTATION CODES:   Not applicable  INTERVENTION:   Vital 1.2_0 /hr- Initiate at 60m/hr and increase by 181mhr q 8 hours until goal rate is reached.   Free water flushes 3015m4 hours to maintain tube patency   Regimen provides 1440kcal/day, 90g/day protein and 1153m23my of free water.   Renal-vit daily via tube   Pt at high refeed risk; recommend monitor potassium, magnesium and phosphorus labs daily until stable  Daily weights   NUTRITION DIAGNOSIS:   Inadequate oral intake related to inability to eat (pt sedated and ventilated) as evidenced by NPO status.  GOAL:   Provide needs based on ASPEN/SCCM guidelines  MONITOR:   Vent status, Labs, Weight trends, TF tolerance, Skin, I & O's  REASON FOR ASSESSMENT:   Ventilator Assessment of nutrition requirement/status  ASSESSMENT:   76 y64 female with h/o ESRD s/p kidney transplant 2013 on immunosuppressive therapy, dementia, IDDM, PAF, depression, anxiety, OCD and GERD who is admitted with viral PNA secondary to RSV and AKI.  Pt sedated and ventilated. OGT in place. Will plan to initiate tube feeds today. Pt is likely at high refeed risk. Plan today is for BAL. Pt with worsening AKI; Nephrology following. Per chart, pt appears to up ~15lbs since admission. Pt +6.2L since admission. Pt with worsening fat and muscle depletions on exam today. Pt at high risk for developing malnutrition.   Medications reviewed and include: colace, lovenox, insulin, MVI, mycophenolate, protonix, miralax, cefepime, LRS _1 /hr, levophed, vancomycin   Labs reviewed: Na 132(L), BUN 68(H), creat 3.17(H), Mg 2.3 wnl Wbc- 17.0(H), Hgb 10.2(L), Hct 31.8(L) Cbgs- 128, 146, 145, 197 x 24 hrs AIC 7.3(H)  Patient is currently intubated on ventilator support MV: 11.0 L/min Temp (24hrs), Avg:99 F (37.2 C), Min:98.5 F (36.9 C), Max:99.6 F (37.6 C)  Propofol: none   MAP- >65mm13m UOP- 279ml 34mTRITION  - FOCUSED PHYSICAL EXAM:  Flowsheet Row Most Recent Value  Orbital Region Mild depletion  Upper Arm Region Severe depletion  Thoracic and Lumbar Region No depletion  Buccal Region No depletion  Temple Region Mild depletion  Clavicle Bone Region Mild depletion  Clavicle and Acromion Bone Region Mild depletion  Scapular Bone Region Mild depletion  Dorsal Hand Unable to assess  Patellar Region Moderate depletion  Anterior Thigh Region Mild depletion  Posterior Calf Region Mild depletion  Edema (RD Assessment) Moderate  Hair Reviewed  Eyes Reviewed  Mouth Reviewed  Skin Reviewed  Nails Reviewed   Diet Order:   Diet Order     None      EDUCATION NEEDS:   No education needs have been identified at this time  Skin:  Skin Assessment: Reviewed RN Assessment  Last BM:  12/9- TYPE 7  Height:   Ht Readings from Last 1 Encounters:  06/02/2022 _2  (1.702 m)    Weight:   Wt Readings from Last 1 Encounters:  06/11/22 60 kg    Ideal Body Weight:  61.4 kg  BMI:  Body mass index is 20.72 kg/m.  Estimated Nutritional Needs:   Kcal:  1418kcal/day  Protein:  80-90g/day  Fluid:  1.4-1.6L/day  Calley Drenning Koleen DistanceD, LDN Please refer to [facility] and/or RD on-call/weekend/after hours pager

## 2022-06-12 NOTE — Progress Notes (Signed)
[facility] for Electrolyte Monitoring and Replacement   Recent Labs: Potassium  Date Value  06/12/2022 4.1 mmol/L  02/02/2016 4.5 mEq/L   Magnesium (mg/dL)  Date Value  06/12/2022 2.3  12/11/2013 2.3   Calcium (mg/dL)  Date Value  06/12/2022 8.2 (L)  02/02/2016 9.3   Albumin (g/dL)  Date Value  06/11/2022 3.0 (L)  02/02/2016 4.0   Phosphorus (mg/dL)  Date Value  09/15/2010 5.3 (H)   Sodium  Date Value  06/12/2022 132 mmol/L (L)  02/02/2016 136 mEq/L    Assessment: 76 year old female with past medical history of kidney transplant 2013 (on immunosuppressants- tacrolimus and mycophenolate), dementia, paroxysmal atrial fibrillation, and insulin dependent T2DM. Admitted with +RSV with superimposed bacterial pneumonia ISO immunocompromised state, acute on chronic hyponatremia secondary to decreased oral intake and AKI with metabolic acidosis. Currently intubated on 50% FiO2 and sedated on propofol. Receiving pressor support with norepinephrine @ 2 mcg/min. Pharmacy has been consulted to manage electrolytes while patient is under PCCM care.  Goal of Therapy:  K > 4 Mag > 2 Na to baseline (nephrology consulted) All other electrolytes within normal limits  Plan:  No replacement warranted Continue LR '@50'$  mL/hr (per nephrology & PCCM) Follow up labs tomorrow AM   Pearla Dubonnet, PharmD Clinical Pharmacist 06/12/2022 11:21 AM

## 2022-06-12 NOTE — Procedures (Signed)
Bronchoscopy Procedure Note  Jasmine Cummings  [id number]  October 07, 1945  Date:06/12/22  Time:11:12 AM   Provider Performing:Brent Dariona Postma   Procedure(s):  Flexible bronchoscopy with bronchial alveolar lavage (93903)  Indication(s) Worsening pneumonia, respiratory failure, immunocompromised  Consent Risks of the procedure as well as the alternatives and risks of each were explained to the patient and/or caregiver.  Consent for the procedure was obtained and is signed in the bedside chart  Anesthesia Sedated on vent with fentanyl infusion, gave 44mg IV bolus and midazolam 290mIV   Time Out Verified patient identification, verified procedure, site/side was marked, verified correct patient position, special equipment/implants available, medications/allergies/relevant history reviewed, required imaging and test results available.   Sterile Technique Usual hand hygiene, masks, gowns, and gloves were used   Procedure Description Bronchoscope advanced through endotracheal tube and into airway.  Airways were examined down to subsegmental level with findings noted below.   Following diagnostic evaluation, BAL(s) performed in anterior segment of RLL with normal saline and return of 25 cc cloudy fluid  Findings:  Normal trachea, sharp carina.  The tracheobronchial tree was without anatomic change or mass bilaterally.  There were scant thick secretions bilaterally, somewhat bloody in the right upper lobe, most prominent in the lower lobes bilaterally.  Worse in the right lower lobe.  Secretions were non-obstructing.   Complications/Tolerance None; patient tolerated the procedure well. Chest X-ray is not needed post procedure.   EBL Minimal   Specimen(s) BAL: bacterial, fungal, AFB culture  BrRoselie AwkwardMD [place] PCCM Pager: ([phone]: [phone] 7:00 pm call Elink  [phone]

## 2022-06-12 NOTE — TOC Initial Note (Signed)
Transition of Care [facility]) - Initial/Assessment Note    Patient Details  Name: Jasmine Cummings MRN: [id number] Date of Birth: Nov 01, 1945  Transition of Care [facility]) CM/SW Contact:    Shelbie Hutching, RN Phone Number: 06/12/2022, 11:38 AM  Clinical Narrative:                 Patient admitted to the hospital for pneumonia due to RSV.  Patient is from home, she is currently in the ICU intubated and sedated. TOC will follow.   Expected Discharge Plan:  (TBD) Barriers to Discharge: Continued Medical Work up   Patient Goals and CMS Choice Patient states their goals for this hospitalization and ongoing recovery are:: patient intubated and sedated      Expected Discharge Plan and Services Expected Discharge Plan:  (TBD)   Discharge Planning Services: CM Consult   Living arrangements for the past 2 months: Single Family Home                                      Prior Living Arrangements/Services Living arrangements for the past 2 months: Single Family Home   Patient language and need for interpreter reviewed:: Yes        Need for Family Participation in Patient Care: Yes (Comment) Care giver support system in place?: Yes (comment)   Criminal Activity/Legal Involvement Pertinent to Current Situation/Hospitalization: No - Comment as needed  Activities of Daily Living Home Assistive Devices/Equipment: Eyeglasses ADL Screening (condition at time of admission) Patient's cognitive ability adequate to safely complete daily activities?: Yes Is the patient deaf or have difficulty hearing?: No Does the patient have difficulty seeing, even when wearing glasses/contacts?: No Does the patient have difficulty concentrating, remembering, or making decisions?: Yes Patient able to express need for assistance with ADLs?: Yes Does the patient have difficulty dressing or bathing?: No Independently performs ADLs?: Yes (appropriate for developmental age) Does the patient have difficulty  walking or climbing stairs?: Yes Weakness of Legs: Both Weakness of Arms/Hands: None  Permission Sought/Granted      Share Information with NAME: Denice Parker     Permission granted to share info w Relationship: daughter  Permission granted to share info w Contact Information: [phone]  Emotional Assessment Appearance:: Appears stated age Attitude/Demeanor/Rapport: Intubated (Following Commands or Not Following Commands) Affect (typically observed): Unable to Assess   Alcohol / Substance Use: Not Applicable Psych Involvement: No (comment)  Admission diagnosis:  Hypoxia [R09.02] RSV infection [B33.8] Pneumonia due to respiratory syncytial virus (RSV) [J12.1] Community acquired pneumonia, unspecified laterality [J18.9] Patient Active Problem List   Diagnosis Date Noted   Uncontrolled type 2 diabetes mellitus with hyperglycemia, with long-term current use of insulin ([place]) 06/10/2022   Acute respiratory failure with hypoxia (HCC) 46/27/0350   Metabolic acidosis 09/38/1829   AKI (acute kidney injury) ([place]) 06/28/2022   Pneumonia due to respiratory syncytial virus (RSV) 06/23/2022   Dementia without behavioral disturbance ([place]) 06/22/2022   Nonrheumatic aortic valve disorder 05/29/2022   PAF (paroxysmal atrial fibrillation) ([place]) 05/29/2022   Pain in right hand 05/07/2019   Thrombocytopenia ([place]) 05/31/2015   Pseudophakia of both eyes 05/31/2015   Low weight 01/15/2014   Vaginal atrophy 10/09/2013   Osteopenia 10/09/2013   Immunosuppression ([place]) 01/05/2012   Traction retinal detachment 01/05/2012   Tick bite 12/12/2011   Ureteral stent occlusion of kidney transplant ([place]) 11/14/2011   H/O orthostatic hypotension 10/26/2011   Hypomagnesemia  10/26/2011   Hyponatremia 10/09/2011   Disorder of electrolytes 10-30-11   Deceased-donor kidney transplant recipient 09/28/2011   Essential hypertension 09/28/2011   Immunosuppressive management encounter following kidney  transplant 09/28/2011   Subconjunctival hemorrhage 08/23/2011   Proliferative diabetic retinopathy ([place]) 07/07/2011   Acute kidney injury superimposed on chronic kidney disease ([place]) 05/05/2011   Diabetes mellitus 05/05/2011   IDDM (insulin dependent diabetes mellitus) 04/19/2010   Iron deficiency anemia 04/19/2010   PCP:  Reynold Bowen, MD Pharmacy:   [id number] [id number]: [phone]: [phone]    Social Determinants of Health (SDOH) Interventions    Readmission Risk Interventions     No data to display

## 2022-06-12 NOTE — Progress Notes (Signed)
Attending:    Subjective: RSV pneumonia Required intubation On fentanyl today Propofol stopped  Levophed started after an episode of hypotension post propofol bolus WBC up Cr up S/p renal transplant Antibiotics changed yesterday in setting of worsening shock  Objective: Vitals:   06/12/22 0800 06/12/22 0801 06/12/22 0803 06/12/22 0900  BP: (!) 95/58   101/63  Pulse: 96   97  Resp: (!) 26   (!) 22  Temp:      TempSrc:      SpO2: 97% 97% 96% 97%  Weight:      Height:       Vent Mode: PRVC FiO2 (%):  [50 %-55 %] 55 % Set Rate:  [25 bmp] 25 bmp Vt Set:  [420 mL] 420 mL PEEP:  [5 cmH20] 5 cmH20 Plateau Pressure:  [17 cmH20-22 cmH20] 17 cmH20  Intake/Output Summary (Last 24 hours) at 06/12/2022 1025 Last data filed at 06/12/2022 0800 Gross per 24 hour  Intake 2594.52 ml  Output 239 ml  Net 2355.52 ml    General:  In bed on vent HENT: NCAT ETT in place PULM: CTA B, vent supported breathing CV: RRR, no mgr GI: BS+, soft, nontender MSK: normal bulk and tone Neuro: sedated on vent   CBC    Component Value Date/Time   WBC 17.0 (H) 06/12/2022 0340   RBC 3.66 (L) 06/12/2022 0340   HGB 10.2 (L) 06/12/2022 0340   HGB 13.3 10/12/2016 0941   HCT 31.8 (L) 06/12/2022 0340   HCT 39.6 10/12/2016 0941   PLT 282 06/12/2022 0340   PLT 125 Platelet count consistent in citrate (L) 10/12/2016 0941   MCV 86.9 06/12/2022 0340   MCV 89 10/12/2016 0941   MCH 27.9 06/12/2022 0340   MCHC 32.1 06/12/2022 0340   RDW 13.8 06/12/2022 0340   RDW 12.4 10/12/2016 0941   LYMPHSABS 0.9 06/11/2022 0546   LYMPHSABS 1.2 10/12/2016 0941   MONOABS 1.2 (H) 06/11/2022 0546   MONOABS 0.7 12/11/2013 0447   EOSABS 0.0 06/11/2022 0546   EOSABS 0.3 10/12/2016 0941   BASOSABS 0.0 06/11/2022 0546   BASOSABS 0.0 10/12/2016 0941    BMET    Component Value Date/Time   NA 132 (L) 06/12/2022 0340   NA 136 02/02/2016 1014   K 4.1 06/12/2022 0340   K 4.5 02/02/2016 1014   CL 102 06/12/2022 0340    CL 106 12/11/2013 0447   CO2 19 (L) 06/12/2022 0340   CO2 28 02/02/2016 1014   GLUCOSE 150 (H) 06/12/2022 0340   GLUCOSE 170 (H) 02/02/2016 1014   BUN 68 (H) 06/12/2022 0340   BUN 20.4 02/02/2016 1014   CREATININE 3.17 (H) 06/12/2022 0340   CREATININE 1.1 02/02/2016 1014   CALCIUM 8.2 (L) 06/12/2022 0340   CALCIUM 9.3 02/02/2016 1014   GFRNONAA 15 (L) 06/12/2022 0340   GFRNONAA 33 (L) 12/11/2013 0447   GFRAA >60 10/30/2018 2049   GFRAA 38 (L) 12/11/2013 0447    CXR images reviewed, 12/10 severe bilateral airspace disease  Impression/Plan:  Acute respiratory failure with hypoxemia RSV pneumonia AKI Septic shock Likely bacterial superinfection pneumonia Need for sedation for mechanical ventilation   Plan Nephrology consulted Continue tacrolimus for now Bronch BAL Check tacrolimus level Wean down FiO2 today Full mechanical vent support VAP prevention Daily WUA/SBT Starting tube feeding today Continue PPI Continue lovenox Vanc/cefepime to continue Echo   Daughter updated by phone  My cc time 32 minutes  Roselie Awkward, MD [place] [facility] Pager: [phone] Cell: [phone]  After 7pm: [phone]

## 2022-06-12 NOTE — Progress Notes (Signed)
NAME:  Jasmine Cummings, MRN:  [id number], DOB:  March 06, 1946, LOS: 5 ADMISSION DATE:  06/24/2022, CONSULTATION DATE:  06/11/2022 REFERRING MD:  Enzo Bi  CHIEF COMPLAINT: Respiratory Failure   Brief Pt Description / Synopsis  76 y.o. female admitted with Severe Sepsis with septic shock and Acute Hypoxic Respiratory Failure in the setting of RSV Pneumonia & suspected superimposed Aspiration/Bacterial Pneumonia in immunocompromised patient requiring intubation and mechanical ventilation.  Also with AKI superimposed on CKD Stage III (hx of renal transplant).  HPI  76 y.o female with significant PMH of Kidney transplant (2013) on immunosuppressants, MCI, PAF, T2DM, and HTN who presented to the ED with chief complaints of C/O two weeks of body aches, c/o chills, cough - non productive. nausea x 2 days and left sided pain x 1 month. Sates exposed to RSV and flu by grandson.   ED Course: Initial vital signs showed HR of 65 beats/minute, BP 132/65 mm Hg, the RR 16 breaths/minute, and the oxygen saturation 92 % on and a temperature of 98.24F (36.9C).   Pertinent Labs/Diagnostics Findings: Chemistry:Na+/ K+: 129/4.4  Glucose: 293 BUN/Cr: 34/1.49  CBC: Unremarkable Other Lab findings: PCT: 1.43  COVID PCR: Negative,  RSV: Positive  Imaging: CTA Chest Abd/pelvis>1. Small bilateral pleural effusions. 2. Patchy areas of airspace disease as above, most pronounced within the lower lobes and right upper lobe. Favor multifocal pneumonia given clinical presentation. Continued follow up after appropriate medical management is recommended to document resolution. 3. Right lower quadrant transplant kidney, with nonobstructing renal calculi measuring up to 7 mm.  Hospital Course: Patient given 30 cc/kg of fluids and started on broad-spectrum antibiotics Ceftriaxone and Azithromycin for suspected sepsis secondary. Patient admitted to [facility] unit under hospitalist service. See significant events below.  Past Medical  History  Kidney transplant (2013) on immunosuppressants, MCI, PAF, T2DM, and HTN    Significant Hospital Events   12/6: Admitted to [facility] unit with acute hypoxic respiratory failure secondary to RSV pneumonia 12/8: Nephrology consulted due to ESRD and history of deceased donor kidney transplant 12/8: Rapid response called for respiratory distress with tachypnea, use of assessor muscle and hypoxia 74% on 4 L.  Patient placed on BiPAP and transferred to the ICU. 12/9: Remained on BiPAP, however with worsening respiratory distress, increased work of breathing and JVD, BiPAP setting adjusted gradually with prior to up to 100% with sats still in the 80s.  Due to worsening respiratory failure and now patient altered with increased work of breathing patient was emergently intubated for airway protection. 12/11: Worsening AKI with Oliguria. Nephrology following. Worsening Leukocytosis, plan for Bronchoscopy.  Obtain Echo due to persistent shock.  Code status changed to DNR/DNI per daughter's request.  Consults:  Nephrology  Procedures:  12/9: Intubation 12/11: Bronchoscopy  Significant Diagnostic Tests:  12/9 CXR IMPRESSION: 1. Significant progression in bilateral airspace disease, more so on the right. There also septal thickening, findings are suspicious for pulmonary edema, although multifocal pneumonia is also considered. 2. Enlarging bilateral pleural effusions. Bibasilar consolidations likely compressive atelectasis related to pleural effusions.  12/6: CT Chest, abdomen and pelvis> IMPRESSION: 1. Small bilateral pleural effusions. 2. Patchy areas of airspace disease as above, most pronounced within the lower lobes and right upper lobe. Favor multifocal pneumonia given clinical presentation. Continued follow up after appropriate medical management is recommended to document resolution. 3. Right lower quadrant transplant kidney, with nonobstructing renal calculi measuring up to 7  mm. 4. Calcification of the aortic and mitral valves. 5. Aortic Atherosclerosis (  ICD10-I70.0). Coronary artery atherosclerosis.  Micro Data:  12/6: SARS-CoV-2 PCR> negative 12/6: Influenza PCR> negative 12/6: RSV> POSITIVE 12/9: Blood culture x2> NGTD 12/9: MRSA PCR>> negative 12/10: Strep pneumo urinary antigen>negative 12/10: Legionella urinary antigen> 12/11: BAL>>  Antimicrobials:  Vancomycin 12/10> Cefepime 12/10>  OBJECTIVE  Blood pressure (!) 96/58, pulse 98, temperature 99.3 F (37.4 C), temperature source Axillary, resp. rate (!) 22, height _0  (1.702 m), weight 60 kg, SpO2 96 %.    Vent Mode: PRVC FiO2 (%):  [50 %-60 %] 55 % Set Rate:  [25 bmp] 25 bmp Vt Set:  [420 mL] 420 mL PEEP:  [5 cmH20] 5 cmH20 Plateau Pressure:  [22 cmH20] 22 cmH20   Intake/Output Summary (Last 24 hours) at 06/12/2022 0752 Last data filed at 06/12/2022 0600 Gross per 24 hour  Intake 2445.57 ml  Output 289 ml  Net 2156.57 ml    Filed Weights   06/26/2022 1206 06/11/22 0500  Weight: 53.1 kg 60 kg   Physical Examination  GENERAL: 76 year-old critically ill patient lying in the bed intubated and sedated, in NAD EYES: Pupils equal, round, reactive to light and accommodation. No scleral icterus. Extraocular muscles intact.  HEENT: Head atraumatic, normocephalic. Oropharynx and nasopharynx clear.  orally intubated NECK:  Supple, + jugular venous distention. No thyroid enlargement, no tenderness.  LUNGS: Coarse breath sounds bilaterally, no wheezing,synchronous with the vent, even, nonlabored CARDIOVASCULAR: RRR, S1, S2 normal. No murmurs, rubs, or gallops.  ABDOMEN: Soft, nontender, nondistended. Bowel sounds present. No organomegaly or mass.  EXTREMITIES: Upper and lower extremities are atraumatic in appearance without tenderness or deformity. Lower extremities Bilateral pitting edema  Capillary refill is less than 3 seconds in all extremities. Pulses palpable.  NEUROLOGIC:Sedated,  withdraws from pain but not following commands, pupils PERRL  PSYCHIATRIC The patient is intubated and sedated SKIN: No obvious rash, lesion, or ulcer.   Labs/imaging that I havepersonally reviewed  (right click and "Reselect all SmartList Selections" daily)     Labs   CBC: Recent Labs  Lab 07/01/2022 1213 06/08/22 0459 06/10/22 0532 06/10/22 1930 06/11/22 0420 06/11/22 0546 06/12/22 0340  WBC 7.3   < > 9.2 13.2* 17.0* 14.4* 17.0*  NEUTROABS 5.0  --   --  11.6*  --  12.3*  --   HGB 10.4*   < > 10.4* 10.2* 10.8* 10.1* 10.2*  HCT 32.4*   < > 31.3* 31.1* 32.7* 30.8* 31.8*  MCV 86.6   < > 84.6 85.0 83.8 85.3 86.9  PLT 151   < > 182 212 223 190 282   < > = values in this interval not displayed.     Basic Metabolic Panel: Recent Labs  Lab 06/09/22 0532 06/10/22 0532 06/10/22 1930 06/11/22 0420 06/11/22 0546 06/12/22 0340  NA 131* 131* 130* 132* 129* 132*  K 4.0 3.7 4.0 4.0 4.1 4.1  CL 104 103 101 100 100 102  CO2 19* 18* 16* 16* 17* 19*  GLUCOSE 160* 132* 247* 242* 246* 150*  BUN 54* 52* 64* 65* 63* 68*  CREATININE 1.97* 1.95* 2.40* 2.34* 2.28* 3.17*  CALCIUM 8.4* 8.2* 8.4* 8.4* 8.1* 8.2*  MG 2.0 2.1 2.2 2.3  --  2.3    GFR: Estimated Creatinine Clearance: 14.1 mL/min (A) (by C-G formula based on SCr of 3.17 mg/dL (H)). Recent Labs  Lab 06/09/2022 2212 06/08/22 0459 06/10/22 1930 06/10/22 2220 06/11/22 0420 06/11/22 0546 06/11/22 0722 06/12/22 0340  PROCALCITON 1.43  --  1.80  --   --  1.86  --  2.80  WBC  --    < > 13.2*  --  17.0* 14.4*  --  17.0*  LATICACIDVEN  --   --  3.1* 1.7  --  1.1 1.0  --    < > = values in this interval not displayed.     Liver Function Tests: Recent Labs  Lab 06/18/2022 1213 06/10/22 1930 06/11/22 0546  AST 23 36 38  ALT _0 ALKPHOS 63 67 63  BILITOT 1.1 0.6 0.8  PROT 7.1 7.1 6.5  ALBUMIN 3.4* 3.4* 3.0*    Recent Labs  Lab 06/06/2022 1213  LIPASE 27    No results for input(s): "AMMONIA" in the last 168  hours.  ABG    Component Value Date/Time   PHART 7.34 (L) 06/11/2022 0355   PCO2ART 32 06/11/2022 0355   PO2ART 137 (H) 06/11/2022 0355   HCO3 17.3 (L) 06/11/2022 0355   ACIDBASEDEF 7.4 (H) 06/10/2022 2133   O2SAT 99 06/11/2022 0355     Coagulation Profile: No results for input(s): "INR", "PROTIME" in the last 168 hours.  Cardiac Enzymes: No results for input(s): "CKTOTAL", "CKMB", "CKMBINDEX", "TROPONINI" in the last 168 hours.  HbA1C: Hemoglobin A1C  Date/Time Value Ref Range Status  12/11/2013 04:47 AM 8.3 (H) 4.2 - 6.3 % Final    Comment:    The American Diabetes Association recommends that a primary goal of therapy should be <7% and that physicians should reevaluate the treatment regimen in patients with HbA1c values consistently >8%.    Hgb A1c MFr Bld  Date/Time Value Ref Range Status  06/24/2022 12:13 PM 7.3 (H) 4.8 - 5.6 % Final    Comment:    (NOTE)         Prediabetes: 5.7 - 6.4         Diabetes: >6.4         Glycemic control for adults with diabetes: <7.0     CBG: Recent Labs  Lab 06/11/22 1922 06/11/22 2026 06/12/22 0016 06/12/22 0348 06/12/22 0714  GLUCAP 166* 169* 197* 145* 146*     Review of Systems:   UNABLE TO OBTAIN PATIENT INTUBATED AND SEDATED  Past Medical History  She,  has a past medical history of Anemia, Anxiety, Arthritis, Chronic kidney disease, Depression, Diabetes mellitus, Diabetic retinopathy ([place]), GERD (gastroesophageal reflux disease), Hypertension, Memory impairment, Obsessive compulsive disorder, Osteopenia (03/2018), and Thrombocytopenia ([place]) (05/31/2015).   Surgical History    Past Surgical History:  Procedure Laterality Date   APPENDECTOMY     BREAST EXCISIONAL BIOPSY Bilateral    CATARACT EXTRACTION     CESAREAN SECTION  1968   KIDNEY TRANSPLANT Right 09/2011   RETINAL DETACHMENT SURGERY Left 2006   TUBAL LIGATION  1981     Social History   reports that she has never smoked. She has never used smokeless  tobacco. She reports that she does not drink alcohol and does not use drugs.   Family History   Her family history includes Breast cancer (age of onset: 53) in her sister; Diabetes in her brother and brother; Heart disease in her brother and sister; Stroke in her mother.   Allergies Allergies  Allergen Reactions   Latex Hives    REACTION: swelling Other reaction(s): Unknown   Other Other (See Comments)    [DERM]   Silicone     Other reaction(s): Other (See Comments) [DERM]    Home Medications  Prior to Admission medications   Medication Sig Start  Date End Date Taking? Authorizing Provider  ALPRAZolam (XANAX) 0.25 MG tablet one po bid Oral   Yes [provider]  amLODipine (NORVASC) 10 MG tablet Take 10 mg by mouth at bedtime.   Yes [provider]  atorvastatin (LIPITOR) 20 MG tablet Take 20 mg by mouth daily. 04/26/22  Yes [provider]  brimonidine (ALPHAGAN) 0.2 % ophthalmic solution Place 1 drop into the right eye 2 (two) times daily. 07/20/21  Yes [provider]  Continuous Blood Gluc Sensor (FREESTYLE LIBRE SENSOR SYSTEM) MISC FreeStyle Libre 14 Day Sensor kit  CHANGE EVERY 14 DAYS DX E10.29   Yes [provider]  FLUoxetine (PROZAC) 40 MG capsule Take 40 mg by mouth every morning.    Yes [provider]  insulin aspart (NOVOLOG) 100 UNIT/ML injection Inject 12 Units into the skin 4 (four) times daily. Sliding Scale   Yes [provider]  Insulin Glargine (LANTUS [place]) Inject 9 Units into the skin daily. 9 units in AM   Yes [provider]  metoprolol tartrate (LOPRESSOR) 25 MG tablet Take 50 mg by mouth 2 (two) times daily.  01/30/13  Yes [provider]  Multiple Vitamin (MULTIVITAMIN) capsule Take 1 capsule by mouth daily.     Yes [provider]  mycophenolate (MYFORTIC) 180 MG EC tablet Take 180-360 mg by mouth 2 (two) times daily. Takes 2 in the morning and 1 at night   Yes [provider]  NOVOFINE 32G X 6 MM MISC as needed. Reported on 01/06/2016 03/11/13  Yes [provider]  omeprazole (PRILOSEC) 20 MG capsule Take 20 mg by mouth daily. 01/16/13  Yes [provider]  tacrolimus (PROGRAF) 1 MG capsule Take 1-2 mg by mouth 2 (two) times daily. Takes 2 in the morning and 1 at night   Yes [provider]  zinc gluconate 50 MG tablet Take 50 mg by mouth daily.   Yes [provider]  Scheduled Meds:  albuterol  2.5 mg Nebulization BID   amLODipine  10 mg Oral QHS   atorvastatin  20 mg Oral Daily   brimonidine  1 drop Right Eye BID   Chlorhexidine Gluconate Cloth  6 each Topical Daily   docusate  100 mg Per Tube BID   enoxaparin (LOVENOX) injection  30 mg Subcutaneous Q24H   FLUoxetine  40 mg Oral q morning   insulin aspart  0-15 Units Subcutaneous TID WC   insulin aspart  0-5 Units Subcutaneous QHS   insulin aspart  3 Units Subcutaneous TID WC   insulin glargine-yfgn  9 Units Subcutaneous QHS   metoprolol tartrate  50 mg Oral BID   multivitamin with minerals  1 tablet Oral Daily   mycophenolate  180 mg Oral QHS   mycophenolate  360 mg Oral Daily   mouth rinse  15 mL Mouth Rinse Q2H   pantoprazole  40 mg Oral Daily   polyethylene glycol  17 g Per Tube Daily   tacrolimus  1 mg Oral QHS   tacrolimus  2 mg Oral Daily   Continuous Infusions:  sodium chloride     ceFEPime (MAXIPIME) IV Stopped (06/12/22 0100)   fentaNYL infusion INTRAVENOUS 125 mcg/hr (06/12/22 0600)   lactated ringers 50 mL/hr at 06/12/22 0600   norepinephrine (LEVOPHED) Adult infusion 7 mcg/min (06/12/22 0600)   promethazine (PHENERGAN) injection (IM or IVPB) Stopped (06/10/22 1214)   propofol (DIPRIVAN) infusion Stopped (06/11/22 1505)   [START ON 06/13/2022] vancomycin  PRN Meds:.acetaminophen **OR** acetaminophen, albuterol, guaiFENesin-dextromethorphan, hydrOXYzine, midazolam, ondansetron **OR** ondansetron (ZOFRAN) IV, mouth rinse, promethazine  (PHENERGAN) injection (IM or IVPB)  Active Hospital Problem list   See below  Assessment & Plan:   Acute Hypoxic Respiratory Failure due to Severe RSV Multifocal Pneumonia  -Full vent support, implement lung protective strategies -Plateau pressures less than 30 cm H20 -Wean FiO2 & PEEP as tolerated to maintain O2 sats >92% -Follow intermittent Chest X-ray & ABG as needed -Spontaneous Breathing Trials when respiratory parameters met and mental status permits -Implement VAP Bundle -Bronchodilators -ABX as above -Plan for Bronch 12/11  Septic Shock Paroxsymal Atrial Fibrillation (not on anticoagulation) Echo 2/23: LVEF 55-50%, grade II DD, RV function normal, mild pulmonary hypertension, moderate Aortic Stenosis -Continuous cardiac monitoring -Maintain MAP >65 -Gentle IV fluids -Vasopressors as needed to maintain MAP goal -Will repeat Echocardiogram 12/11  Severe Sepsis in the setting of RSV Pneumonia & suspected superimposed Aspiration/Bacterial Pneumonia in immunocompromised patient Meets SIRS Criteria  -Monitor fever curve -Trend WBC's & Procalcitonin -Follow cultures as above -Continue empiric Vancomycin and Cefepime pending cultures & sensitivities   AKI on CKD stage III, likely ATN in the setting of above Mild Hyponatremia Acute metabolic acidosis PMHx: Deceased donor renal transplant (2013) Baseline Creatinine 1.2 -Monitor I&O's / urinary output -Follow BMP -Ensure adequate renal perfusion -Avoid nephrotoxic agents as able -Replace electrolytes as indicated -Pharmacy following for assistance with electrolyte replacement -Gentle IV fluids -Nephrology following, appreciate input  Diabetes mellitus HgbA1c  -CBG's AC & hs; Target range of 140 to 180 -SSI -Follow ICU Hypo/Hyperglycemia protocol  Acute Metabolic Encephalopathy  Sedation Needs in setting of mechanical ventilation Hx: MCI -Treatment of metabolic derangements and sepsis as outlined above -Maintain  a RASS goal of 0 to -1 -Fentanyl and Propofol as needed to maintain RASS goal -Avoid sedating medications as able -Daily wake up assessment    Pt is critically ill with multiorgan failure.  High risk for decompensation, cardiac arrest and death.  Prognosis is guarded.  Discussion with pt's daughter Ulyses Southward, she reports her mother has specifically said to her in the past that she would want to be DNR.  Denice requests that we change code status to DNR, continue to treat the treatable for now and see how clinical course trends.   Best practice:  Diet:  NPO, start tube feeds Pain/Anxiety/Delirium protocol (if indicated): Yes (RASS goal -1) VAP protocol (if indicated): Yes DVT prophylaxis: LMWH GI prophylaxis: N/A Glucose control:  SSI Yes Central venous access:  N/A Arterial line:  N/A Foley:  Yes, and it is still needed Mobility:  bed rest  PT consulted: N/A Last date of multidisciplinary goals of care discussion [12/11] Code Status:  full code Disposition: ICU  12/11: Updated pt's daughter Ulyses Southward via telephone.   Critical care time: 40 minutes     Darel Hong, AGACNP-BC [facility] Prefer epic messenger for cross cover needs If after hours, please call E-link

## 2022-06-12 NOTE — Progress Notes (Signed)
   06/12/22 1400  Clinical Encounter Type  Visited With Patient  Visit Type Initial  Referral From Nurse  Consult/Referral To Chaplain   Chaplain contacted priest to come visit patient as requested.

## 2022-06-12 NOTE — Procedures (Signed)
Central Venous Catheter Insertion Procedure Note  MILA PAIR  [id number]  1945/08/21  Date:06/12/22  Time:5:20 PM   Provider Performing:Loki Wuthrich D Dewaine Conger   Procedure: Insertion of Non-tunneled Central Venous [phone]) with US guidance ([id number])   Indication(s) Medication administration and Difficult access  Consent Risks of the procedure as well as the alternatives and risks of each were explained to the patient and/or caregiver.  Consent for the procedure was obtained and is signed in the bedside chart  Anesthesia Topical only with 1% lidocaine   Timeout Verified patient identification, verified procedure, site/side was marked, verified correct patient position, special equipment/implants available, medications/allergies/relevant history reviewed, required imaging and test results available.  Sterile Technique Maximal sterile technique including full sterile barrier drape, hand hygiene, sterile gown, sterile gloves, mask, hair covering, sterile ultrasound probe cover (if used).  Procedure Description Area of catheter insertion was cleaned with chlorhexidine and draped in sterile fashion.  With real-time ultrasound guidance a central venous catheter was placed into the left internal jugular vein. Nonpulsatile blood flow and easy flushing noted in all ports.  The catheter was sutured in place and sterile dressing applied.  Complications/Tolerance None; patient tolerated the procedure well. Chest X-ray is ordered to verify placement for internal jugular or subclavian cannulation.   Chest x-ray is not ordered for femoral cannulation.  EBL Minimal  Specimen(s) None   Line secured at the 20 cm mark. BIOPATCH applied to the insertion site.   Darel Hong, AGACNP-BC [facility] Prefer epic messenger for cross cover needs If after hours, please call E-link

## 2022-06-12 NOTE — Progress Notes (Signed)
[place], [place] 06/12/22  Subjective:   Hospital day # 5  Patient with worsening renal status. Creatinine up to 3.17. Still on the ventilator at the moment.  Renal: 12/10 0701 - 12/11 0700 In: 2557.9 [I.V.:1378.4; NG/GT:70; IV Piggyback:1099.5] Out: 289 [Urine:279; Emesis/NG output:10] Lab Results  Component Value Date   CREATININE 3.17 (H) 06/12/2022   CREATININE 2.28 (H) 06/11/2022   CREATININE 2.34 (H) 06/11/2022     Objective:  Vital signs in last 24 hours:  Temp:  [98.5 F (36.9 C)-99.6 F (37.6 C)] 99.3 F (37.4 C) (12/11 0730) Pulse Rate:  [91-117] 94 (12/11 1000) Resp:  [15-29] 26 (12/11 1000) BP: (76-141)/(51-114) 92/56 (12/11 1000) SpO2:  [86 %-98 %] 94 % (12/11 1114) FiO2 (%):  [50 %-55 %] 50 % (12/11 1114)  Weight change:  Filed Weights   06/10/2022 1206 06/11/22 0500  Weight: 53.1 kg 60 kg    Intake/Output:    Intake/Output Summary (Last 24 hours) at 06/12/2022 1128 Last data filed at 06/12/2022 1000 Gross per 24 hour  Intake 2756.49 ml  Output 279 ml  Net 2477.49 ml      Physical Exam: General: Critically ill-appearing  HEENT Dry oral mucous membranes  Pulm/lungs Mild basilar coarse breath sounds, vent assisted  CVS/Heart No rub  Abdomen:  Soft, nontender  Extremities: Bilateral lower extremity edema  Neurologic: Currently intubated  Skin: No acute rashes          Basic Metabolic Panel:  Recent Labs  Lab 06/09/22 0532 06/10/22 0532 06/10/22 1930 06/11/22 0420 06/11/22 0546 06/12/22 0340  NA 131* 131* 130* 132* 129* 132*  K 4.0 3.7 4.0 4.0 4.1 4.1  CL 104 103 101 100 100 102  CO2 19* 18* 16* 16* 17* 19*  GLUCOSE 160* 132* 247* 242* 246* 150*  BUN 54* 52* 64* 65* 63* 68*  CREATININE 1.97* 1.95* 2.40* 2.34* 2.28* 3.17*  CALCIUM 8.4* 8.2* 8.4* 8.4* 8.1* 8.2*  MG 2.0 2.1 2.2 2.3  --  2.3      CBC: Recent Labs  Lab 07/02/2022 1213 06/08/22 0459 06/10/22 0532 06/10/22 1930  06/11/22 0420 06/11/22 0546 06/12/22 0340  WBC 7.3   < > 9.2 13.2* 17.0* 14.4* 17.0*  NEUTROABS 5.0  --   --  11.6*  --  12.3*  --   HGB 10.4*   < > 10.4* 10.2* 10.8* 10.1* 10.2*  HCT 32.4*   < > 31.3* 31.1* 32.7* 30.8* 31.8*  MCV 86.6   < > 84.6 85.0 83.8 85.3 86.9  PLT 151   < > 182 212 223 190 282   < > = values in this interval not displayed.      No results found for: "HEPBSAG", "HEPBSAB", "HEPBIGM"    Microbiology:  Recent Results (from the past 240 hour(s))  Resp panel by RT-PCR (RSV, Flu A&B, Covid) Anterior Nasal Swab     Status: Abnormal   Collection Time: 06/19/2022 12:13 PM   Specimen: Anterior Nasal Swab  Result Value Ref Range Status   SARS Coronavirus 2 by RT PCR NEGATIVE NEGATIVE Final    Comment: (NOTE) SARS-CoV-2 target nucleic acids are NOT DETECTED.  The SARS-CoV-2 RNA is generally detectable in upper respiratory specimens during the acute phase of infection. The lowest concentration of SARS-CoV-2 viral copies this assay can detect is 138 copies/mL. A negative result does not preclude SARS-Cov-2 infection and should not be used as the sole basis for treatment or other patient management decisions. A negative  result may occur with  improper specimen collection/handling, submission of specimen other than nasopharyngeal swab, presence of viral mutation(s) within the areas targeted by this assay, and inadequate number of viral copies(<138 copies/mL). A negative result must be combined with clinical observations, patient history, and epidemiological information. The expected result is Negative.  Fact Sheet for Patients:  EntrepreneurPulse.com.au  Fact Sheet for Healthcare Providers:  IncredibleEmployment.be  This test is no t yet approved or cleared by the [place] FDA and  has been authorized for detection and/or diagnosis of SARS-CoV-2 by FDA under an Emergency Use Authorization (EUA). This EUA will remain  in  effect (meaning this test can be used) for the duration of the COVID-19 declaration under Section 564(b)(1) of the Act, 21 U.S.C.section 360bbb-3(b)(1), unless the authorization is terminated  or revoked sooner.       Influenza A by PCR NEGATIVE NEGATIVE Final   Influenza B by PCR NEGATIVE NEGATIVE Final    Comment: (NOTE) The Xpert Xpress SARS-CoV-2/FLU/RSV plus assay is intended as an aid in the diagnosis of influenza from Nasopharyngeal swab specimens and should not be used as a sole basis for treatment. Nasal washings and aspirates are unacceptable for Xpert Xpress SARS-CoV-2/FLU/RSV testing.  Fact Sheet for Patients: EntrepreneurPulse.com.au  Fact Sheet for Healthcare Providers: IncredibleEmployment.be  This test is not yet approved or cleared by the [place] FDA and has been authorized for detection and/or diagnosis of SARS-CoV-2 by FDA under an Emergency Use Authorization (EUA). This EUA will remain in effect (meaning this test can be used) for the duration of the COVID-19 declaration under Section 564(b)(1) of the Act, 21 U.S.C. section 360bbb-3(b)(1), unless the authorization is terminated or revoked.     Resp Syncytial Virus by PCR POSITIVE (A) NEGATIVE Final    Comment: (NOTE) Fact Sheet for Patients: EntrepreneurPulse.com.au  Fact Sheet for Healthcare Providers: IncredibleEmployment.be  This test is not yet approved or cleared by the [place] FDA and has been authorized for detection and/or diagnosis of SARS-CoV-2 by FDA under an Emergency Use Authorization (EUA). This EUA will remain in effect (meaning this test can be used) for the duration of the COVID-19 declaration under Section 564(b)(1) of the Act, 21 U.S.C. section 360bbb-3(b)(1), unless the authorization is terminated or revoked.  Performed at [facility], [address] [place], [place] [address]    Culture, blood (Routine X 2) w Reflex to ID Panel     Status: None (Preliminary result)   Collection Time: 06/10/22 11:47 PM   Specimen: BLOOD RIGHT HAND  Result Value Ref Range Status   Specimen Description BLOOD RIGHT HAND  Final   Special Requests   Final    BOTTLES DRAWN AEROBIC AND ANAEROBIC Blood Culture adequate volume   Culture   Final    NO GROWTH 2 DAYS Performed at [facility], [address], [place], [place] [address]    Report Status PENDING  Incomplete  Culture, blood (Routine X 2) w Reflex to ID Panel     Status: None (Preliminary result)   Collection Time: 06/10/22 11:47 PM   Specimen: BLOOD RIGHT ARM  Result Value Ref Range Status   Specimen Description BLOOD RIGHT ARM  Final   Special Requests   Final    BOTTLES DRAWN AEROBIC AND ANAEROBIC Blood Culture adequate volume   Culture   Final    NO GROWTH 2 DAYS Performed at [facility], [address], [place], [place] [address]    Report Status PENDING  Incomplete  MRSA Next Gen by PCR, Nasal     Status: None   Collection Time: 06/11/22  2:06 PM   Specimen: Nasal Mucosa; Nasal Swab  Result Value Ref Range Status   MRSA by PCR Next Gen NOT DETECTED NOT DETECTED Final    Comment: (NOTE) The GeneXpert MRSA Assay (FDA approved for NASAL specimens only), is one component of a comprehensive MRSA colonization surveillance program. It is not intended to diagnose MRSA infection nor to guide or monitor treatment for MRSA infections. Test performance is not FDA approved in patients less than 61 years old. Performed at [facility], [address] [place], [place] [address]     Coagulation Studies: No results for input(s): "LABPROT", "INR" in the last 72 hours.  Urinalysis: Recent Labs    06/11/22 0052  COLORURINE YELLOW*  LABSPEC 1.021  PHURINE 5.0  GLUCOSEU NEGATIVE  HGBUR NEGATIVE  BILIRUBINUR NEGATIVE  KETONESUR 5*  PROTEINUR 30*  NITRITE NEGATIVE  LEUKOCYTESUR NEGATIVE        Imaging: DG Abd 1 View  Result Date: 06/11/2022 CLINICAL DATA:  Nasogastric tube placement EXAM: ABDOMEN - 1 VIEW COMPARISON:  None Available. FINDINGS: Nasogastric tube tip overlies expected mid body of the stomach. The abdominal gas pattern is indeterminate due to a paucity of intra-abdominal gas. Small bilateral pleural effusions are present. Interstitial pulmonary infiltrates are seen at the right lung base. Vascular calcifications noted within the abdomen. IMPRESSION: 1. Nasogastric tube tip within the mid body of the stomach. Electronically Signed   By: Fidela Salisbury M.D.   On: 06/11/2022 03:18   DG Chest 1 View  Result Date: 06/11/2022 CLINICAL DATA:  Respiratory failure, intubation EXAM: CHEST  1 VIEW COMPARISON:  7:55 p.m. FINDINGS: Endotracheal tube seen 2 cm above the carina. The lungs are symmetrically well expanded and pulmonary insufflation is preserved. Small bilateral pleural effusions are present. Multifocal interstitial airspace infiltrates are again seen, more focal within the right perihilar region, stable since prior examination. No pneumothorax. Cardiac size within normal limits. No acute bone abnormality. IMPRESSION: 1. Endotracheal tube 2 cm above the carina. 2. Stable multifocal pulmonary infiltrate. 3. Small bilateral pleural effusions. Electronically Signed   By: Fidela Salisbury M.D.   On: 06/11/2022 03:17   DG Chest Port 1 View  Result Date: 06/10/2022 CLINICAL DATA:  Hypoxia. EXAM: PORTABLE CHEST 1 VIEW COMPARISON:  Radiograph and CT 06/28/2022 FINDINGS: Significant progression in bilateral airspace disease, more so on the right. Enlarging bilateral pleural effusions. Associated bibasilar consolidations likely represent compressive atelectasis. Suspected septal thickening can be seen with pulmonary edema. Heart is prominent in size. No pneumothorax. IMPRESSION: 1. Significant progression in bilateral airspace disease, more so on the right. There also septal  thickening, findings are suspicious for pulmonary edema, although multifocal pneumonia is also considered. 2. Enlarging bilateral pleural effusions. Bibasilar consolidations likely compressive atelectasis related to pleural effusions. Electronically Signed   By: Keith Rake M.D.   On: 06/10/2022 20:02     Medications:    sodium chloride     ceFEPime (MAXIPIME) IV Stopped (06/12/22 0100)   fentaNYL infusion INTRAVENOUS 125 mcg/hr (06/12/22 1000)   lactated ringers 50 mL/hr at 06/12/22 1000   norepinephrine (LEVOPHED) Adult infusion 7 mcg/min (06/12/22 1000)   promethazine (PHENERGAN) injection (IM or IVPB) Stopped (06/10/22 1214)   propofol (DIPRIVAN) infusion Stopped (06/11/22 1505)   [START ON 06/13/2022] vancomycin      albuterol  2.5 mg Nebulization BID   atorvastatin  20 mg Oral  Daily   brimonidine  1 drop Right Eye BID   Chlorhexidine Gluconate Cloth  6 each Topical Daily   docusate  100 mg Per Tube BID   enoxaparin (LOVENOX) injection  30 mg Subcutaneous Q24H   FLUoxetine  40 mg Oral q morning   insulin aspart  0-9 Units Subcutaneous Q4H   insulin glargine-yfgn  9 Units Subcutaneous QHS   metoprolol tartrate  50 mg Oral BID   multivitamin with minerals  1 tablet Oral Daily   mycophenolate  180 mg Oral QHS   mycophenolate  360 mg Oral Daily   mouth rinse  15 mL Mouth Rinse Q2H   pantoprazole  40 mg Oral Daily   polyethylene glycol  17 g Per Tube Daily   tacrolimus  1 mg Oral QHS   tacrolimus  2 mg Oral Daily   acetaminophen **OR** acetaminophen, albuterol, guaiFENesin-dextromethorphan, hydrOXYzine, midazolam, ondansetron **OR** ondansetron (ZOFRAN) IV, mouth rinse, promethazine (PHENERGAN) injection (IM or IVPB)  Assessment/ Plan:  76 y.o. female with  diabetes type 2, paroxysmal atrial fibrillation, mild cognitive deficits and kidney transplant in 2013, who was  admitted on 07/02/2022 for Hypoxia [R09.02] RSV infection [B33.8] Pneumonia due to respiratory syncytial  virus (RSV) [J12.1] Community acquired pneumonia, unspecified laterality [J18.9]  1. 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  Renal transplant status.  Patient states baseline creatinine 1.2.  Currently followed by [facility] in [place].  Patient has received deceased donor renal transplant in 2013 at [facility].  Kidney failure related to diabetes.  Continue home immunosuppressive regimen which includes, mycophenolate 360 mg in the morning and 180 mg at night and tacrolimus 2 mg in the morning and 1 mg at night.  Renal function worsening now likely secondary to hypotension.  Urine output also dropping to 279 cc over the preceding 24 hours.  May need to consider dialysis if oliguria persists.  Lab Results  Component Value Date   CREATININE 3.17 (H) 06/12/2022   CREATININE 2.28 (H) 06/11/2022   CREATININE 2.34 (H) 06/11/2022   12/10 0701 - 12/11 0700 In: 2557.9 [I.V.:1378.4; NG/GT:70; IV Piggyback:1099.5] Out: 289 [Urine:279; Emesis/NG output:10]    2.  Hyponatremia, likely due to poor oral intake.  Sodium up a bit to 132.   3.  Acute metabolic acidosis.  Likely secondary to acute kidney injury.  Serum bicarbonate a bit higher at 19.  Continue to monitor for now.   4.  Acute respiratory failure/pneumonia due to RSV.  IV antibiotics managed by primary team, currently on cefepime.  Patient maintained on mechanical ventilation.      LOS: [address] 12/11/202311:28 AM  [place], [place]  Note: This note was prepared with Dragon dictation. Any transcription errors are unintentional

## 2022-06-12 NOTE — Progress Notes (Addendum)
1910 patient on vent with continuous fluids and pain medication and pressors patient only responds to pain patient will shake randomly and will stop when placing hand on patient 2230 Increase in pressors decreased in pain medications complete ADL care done and CHG updated daughter via phone on patient condition, CC team aware of decrease urine output bladder scanned 6m  06/12/22 0000 Increase pressors

## 2022-06-12 NOTE — Progress Notes (Signed)
DNR status verified with daughter Langley Gauss on phone by Darel Hong NP. DNR bracelet placed on patient's right wrist by this RN.

## 2022-06-13 ENCOUNTER — Inpatient Hospital Stay (HOSPITAL_COMMUNITY)
Admit: 2022-06-13 | Discharge: 2022-06-13 | Disposition: A | Discharge status: Home / Self Care | Payer: Medicare Other | Attending: Pulmonary Disease | Admitting: Pulmonary Disease

## 2022-06-13 ENCOUNTER — Other Ambulatory Visit: Payer: Self-pay

## 2022-06-13 DIAGNOSIS — R579 Shock, unspecified: Secondary | ICD-10-CM | POA: Diagnosis not present

## 2022-06-13 DIAGNOSIS — I959 Hypotension, unspecified: Secondary | ICD-10-CM

## 2022-06-13 DIAGNOSIS — R6521 Severe sepsis with septic shock: Secondary | ICD-10-CM

## 2022-06-13 DIAGNOSIS — I5021 Acute systolic (congestive) heart failure: Secondary | ICD-10-CM

## 2022-06-13 DIAGNOSIS — I350 Nonrheumatic aortic (valve) stenosis: Secondary | ICD-10-CM

## 2022-06-13 DIAGNOSIS — I219 Acute myocardial infarction, unspecified: Secondary | ICD-10-CM | POA: Diagnosis not present

## 2022-06-13 DIAGNOSIS — J9601 Acute respiratory failure with hypoxia: Secondary | ICD-10-CM

## 2022-06-13 DIAGNOSIS — N186 End stage renal disease: Secondary | ICD-10-CM

## 2022-06-13 DIAGNOSIS — J121 Respiratory syncytial virus pneumonia: Secondary | ICD-10-CM | POA: Diagnosis not present

## 2022-06-13 LAB — PROCALCITONIN: Procalcitonin: 4.45 ng/mL

## 2022-06-13 LAB — COOXEMETRY PANEL
Carboxyhemoglobin: 1.5 % (ref 0.5–1.5)
Methemoglobin: 0.9 % (ref 0.0–1.5)
O2 Saturation: 70.3 %
Total hemoglobin: 10.5 g/dL — ABNORMAL LOW (ref 12.0–16.0)
Total oxygen content: 68.5 %

## 2022-06-13 LAB — ECHOCARDIOGRAM COMPLETE
AR max vel: 0.78 cm2
AV Area VTI: 0.79 cm2
AV Area mean vel: 0.74 cm2
AV Mean grad: 21 mmHg
AV Peak grad: 31.7 mmHg
Ao pk vel: 2.82 m/s
Area-P 1/2: 4.06 cm2
Calc EF: 19.7 %
Height: 67 in
S' Lateral: 3.1 cm
Single Plane A2C EF: 19 %
Single Plane A4C EF: 25.4 %
Weight: 2116.42 oz

## 2022-06-13 LAB — PROTIME-INR
INR: 1.5 — ABNORMAL HIGH (ref 0.8–1.2)
Prothrombin Time: 18.4 seconds — ABNORMAL HIGH (ref 11.4–15.2)

## 2022-06-13 LAB — BASIC METABOLIC PANEL
Anion gap: 10 (ref 5–15)
BUN: 73 mg/dL — ABNORMAL HIGH (ref 8–23)
CO2: 19 mmol/L — ABNORMAL LOW (ref 22–32)
Calcium: 8.1 mg/dL — ABNORMAL LOW (ref 8.9–10.3)
Chloride: 102 mmol/L (ref 98–111)
Creatinine, Ser: 4.11 mg/dL — ABNORMAL HIGH (ref 0.44–1.00)
GFR, Estimated: 11 mL/min — ABNORMAL LOW (ref >60)
Glucose, Bld: 197 mg/dL — ABNORMAL HIGH (ref 70–99)
Potassium: 4.6 mmol/L (ref 3.5–5.1)
Sodium: 131 mmol/L — ABNORMAL LOW (ref 135–145)

## 2022-06-13 LAB — LIPID PANEL
Cholesterol: 91 mg/dL (ref 0–200)
HDL: 43 mg/dL (ref >40)
LDL Cholesterol: 26 mg/dL (ref 0–99)
Total CHOL/HDL Ratio: 2.1 RATIO
Triglycerides: 108 mg/dL (ref <150)
VLDL: 22 mg/dL (ref 0–40)

## 2022-06-13 LAB — CBC
HCT: 33 % — ABNORMAL LOW (ref 36.0–46.0)
Hemoglobin: 10.4 g/dL — ABNORMAL LOW (ref 12.0–15.0)
MCH: 28 pg (ref 26.0–34.0)
MCHC: 31.5 g/dL (ref 30.0–36.0)
MCV: 88.7 fL (ref 80.0–100.0)
Platelets: 317 10*3/uL (ref 150–400)
RBC: 3.72 MIL/uL — ABNORMAL LOW (ref 3.87–5.11)
RDW: 13.7 % (ref 11.5–15.5)
WBC: 16.4 10*3/uL — ABNORMAL HIGH (ref 4.0–10.5)
nRBC: 0 % (ref 0.0–0.2)

## 2022-06-13 LAB — GLUCOSE, CAPILLARY
Glucose-Capillary: 150 mg/dL — ABNORMAL HIGH (ref 70–99)
Glucose-Capillary: 203 mg/dL — ABNORMAL HIGH (ref 70–99)
Glucose-Capillary: 177 mg/dL — ABNORMAL HIGH (ref 70–99)
Glucose-Capillary: 213 mg/dL — ABNORMAL HIGH (ref 70–99)
Glucose-Capillary: 274 mg/dL — ABNORMAL HIGH (ref 70–99)

## 2022-06-13 LAB — PHOSPHORUS: Phosphorus: 6.5 mg/dL — ABNORMAL HIGH (ref 2.5–4.6)

## 2022-06-13 LAB — TROPONIN I (HIGH SENSITIVITY)
Troponin I (High Sensitivity): 15725 ng/L (ref <18)
Troponin I (High Sensitivity): 18107 ng/L (ref <18)

## 2022-06-13 LAB — APTT: aPTT: 42 seconds — ABNORMAL HIGH (ref 24–36)

## 2022-06-13 LAB — MAGNESIUM: Magnesium: 2.4 mg/dL (ref 1.7–2.4)

## 2022-06-13 MED ORDER — ASPIRIN 81 MG PO CHEW
81.0000 mg | CHEWABLE_TABLET | Freq: Every day | ORAL | Status: DC
  Administered 2022-06-14: 81 mg
  Filled 2022-06-13: qty 1

## 2022-06-13 MED ORDER — HEPARIN (PORCINE) 25000 UT/250ML-% IV SOLN
900.0000 [IU]/h | INTRAVENOUS | Status: DC
  Administered 2022-06-13: 700 [IU]/h via INTRAVENOUS
  Administered 2022-06-14: 900 [IU]/h via INTRAVENOUS
  Filled 2022-06-13 (×2): qty 250

## 2022-06-13 MED ORDER — VANCOMYCIN VARIABLE DOSE PER UNSTABLE RENAL FUNCTION (PHARMACIST DOSING): Status: DC

## 2022-06-13 MED ORDER — SODIUM CHLORIDE 0.9 % IV SOLN
1.0000 g | INTRAVENOUS | Status: DC
  Administered 2022-06-14: 1 g via INTRAVENOUS
  Filled 2022-06-13: qty 1

## 2022-06-13 MED ORDER — HEPARIN BOLUS VIA INFUSION
3600.0000 [IU] | Freq: Once | INTRAVENOUS | Status: AC
  Administered 2022-06-13: 3600 [IU] via INTRAVENOUS
  Filled 2022-06-13: qty 3600

## 2022-06-13 MED ORDER — HEPARIN SODIUM (PORCINE) 5000 UNIT/ML IJ SOLN
5000.0000 [IU] | Freq: Three times a day (TID) | INTRAMUSCULAR | Status: DC
  Administered 2022-06-13: 5000 [IU] via SUBCUTANEOUS
  Filled 2022-06-13 (×2): qty 1

## 2022-06-13 MED ORDER — ASPIRIN 81 MG PO CHEW
324.0000 mg | CHEWABLE_TABLET | Freq: Once | ORAL | Status: AC
  Administered 2022-06-13: 324 mg
  Filled 2022-06-13: qty 4

## 2022-06-13 NOTE — Progress Notes (Signed)
[facility]  Echo> LVEF 20-25% Family feels that the patient would not want to start HD Will discuss options for heart failure management with cardiology, consider inotropes if they agree  Overall prognosis worsening  Roselie Awkward, MD [facility] Pager: [phone] Cell: [phone] After 7:00 pm call Elink  [phone]

## 2022-06-13 NOTE — Consult Note (Signed)
ANTICOAGULATION CONSULT NOTE - Initial Consult  Pharmacy Consult for heparin drip Indication: chest pain/ACS  Allergies  Allergen Reactions   Latex Hives    REACTION: swelling Other reaction(s): Unknown   Other Other (See Comments)    [DERM]   Silicone     Other reaction(s): Other (See Comments) [DERM]    Patient Measurements: Height: '5\' 7"'$  (170.2 cm) Weight: 60 kg (132 lb 4.4 oz) IBW/kg (Calculated) : 61.6 Heparin Dosing Weight: 60 kg  Vital Signs: Temp: 98.4 F (36.9 C) (12/12 1200) Temp Source: Axillary (12/12 1200) BP: 87/60 (12/12 1445) Pulse Rate: 86 (12/12 1445)  Labs: Recent Labs    06/11/22 0546 06/12/22 0340 06/13/22 0458 06/13/22 1429  HGB 10.1* 10.2* 10.4*  --   HCT 30.8* 31.8* 33.0*  --   PLT 190 282 317  --   CREATININE 2.28* 3.17* 4.11*  --   TROPONINIHS  --   --   --  64,332*    Estimated Creatinine Clearance: 10.9 mL/min (A) (by C-G formula based on SCr of 4.11 mg/dL (H)).   Medical History: Past Medical History:  Diagnosis Date   Anemia    Anxiety    Arthritis    Chronic kidney disease    Depression    Diabetes mellitus    Diabetic retinopathy ([place])    GERD (gastroesophageal reflux disease)    Hypertension    Memory impairment    Obsessive compulsive disorder    Osteopenia 03/2018   T score -1.9 FRAX 9.7% / 2.1%   Thrombocytopenia ([place]) 05/31/2015    Medications:  No home anticoagulation per pharmacist review  Assessment: 76yo female presented with myalgias, chills, coughs, and nausea.  Was found to be RSV positive.  Developed worsening respiratory failure requiring intubation.  Now found to have significantly elevated troponin.  Baseline aPTT and PT/INR are pending.  Hgb = 10.4, plt = 317  Goal of Therapy:  Heparin level 0.3-0.7 units/ml Monitor platelets by anticoagulation protocol: Yes   Plan:  Give 3600 units bolus x 1 Start heparin infusion at 700 units/hr Check anti-Xa level in 8 hours and daily while on  heparin Continue to monitor H&H and platelets  Lorin Picket, PharmD 06/13/2022,3:59 PM

## 2022-06-13 NOTE — Consult Note (Addendum)
Cardiology Consultation   Patient ID: RASHEEDA MULVEHILL MRN: [id number]; DOB: Apr 21, 1946  Admit date: 06/30/2022 Date of Consult: 06/13/2022  PCP:  Reynold Bowen, [place] Prov[facility] Cardiologist:  New - Tr[place]a Devlin   Patient Profile:   Jasmine Cummings is a 76 y.o. female with a hx of paroxysmal atrial fibrillation not on anticoagulation, aortic stenosis, type 2 diabetes mellitus, Jasmine Cummings-stage renal disease status post renal transplant in 2013, and mild cognitive decline/dementia, who is being seen 06/13/2022 for the evaluation of cardiomyopathy and shock at the request of Dr. Lake Bells.  History of Present Illness:   Ms. Bath is currently intubated and sedated.  I was unable to reach her family by phone.  History is therefore obtained from the chart and primary team.  She was hospitalized on 06/10/2022 with a 2-week history of myalgias, chills, cough, and nausea following RSV exposure.  She was confirmed to be RSV positive, with CT chest, abdomen, and pelvis favoring multifocal pneumonia.  Small bilateral pleural effusions were also noted as well as aortic atherosclerosis and coronary artery calcification.  She was given IV fluid bolus as well as empiric antibiotics with ceftriaxone and azithromycin.  She developed worsening respiratory distress and hypoxia requiring BiPAP placement and ICU transfer on 12/8.  She was intubated the next day for hypoxia and airway protection.  Echocardiogram performed today demonstrated LVEF 25-30% with LAD territory hypokinesis as well as grade 2 diastolic dysfunction, elevated left atrial pressures, and likely severe low-flow/low gradient aortic stenosis.  Moderate mitral and moderate-severe tricuspid regurgitation were also noted.  Echocardiogram in February of this year showed normal LVEF with moderate to severe aortic stenosis with mean gradient of 36 mmHg and valve area of 0.9 cm.   Past Medical History:  Diagnosis Date   Anemia    Anxiety     Arthritis    Chronic kidney disease    Depression    Diabetes mellitus    Diabetic retinopathy ([place])    GERD (gastroesophageal reflux disease)    Hypertension    Memory impairment    Obsessive compulsive disorder    Osteopenia 03/2018   T score -1.9 FRAX 9.7% / 2.1%   Thrombocytopenia ([place]) 05/31/2015    Past Surgical History:  Procedure Laterality Date   APPENDECTOMY     BREAST EXCISIONAL BIOPSY Bilateral    CATARACT EXTRACTION     CESAREAN SECTION  1968   KIDNEY TRANSPLANT Right 09/2011   RETINAL DETACHMENT SURGERY Left 2006   TUBAL LIGATION  1981     Home Medications:  Prior to Admission medications   Medication Sig Start Date Shalen Petrak Date Taking? Authorizing Provider  ALPRAZolam (XANAX) 0.25 MG tablet one po bid Oral   Yes [provider]  amLODipine (NORVASC) 10 MG tablet Take 10 mg by mouth at bedtime.   Yes [provider]  atorvastatin (LIPITOR) 20 MG tablet Take 20 mg by mouth daily. 04/26/22  Yes [provider]  brimonidine (ALPHAGAN) 0.2 % ophthalmic solution Place 1 drop into the right eye 2 (two) times daily. 07/20/21  Yes [provider]  Continuous Blood Gluc Sensor (FREESTYLE LIBRE SENSOR SYSTEM) MISC FreeStyle Libre 14 Day Sensor kit  CHANGE EVERY 14 DAYS DX E10.29   Yes [provider]  FLUoxetine (PROZAC) 40 MG capsule Take 40 mg by mouth every morning.    Yes [provider]  insulin aspart (NOVOLOG) 100 UNIT/ML injection Inject 12 Units into the skin 4 (four) times daily. Sliding  Scale   Yes [provider]  Insulin Glargine (LANTUS Calvin) Inject 9 Units into the skin daily. 9 units in AM   Yes [provider]  metoprolol tartrate (LOPRESSOR) 25 MG tablet Take 50 mg by mouth 2 (two) times daily.  01/30/13  Yes [provider]  Multiple Vitamin (MULTIVITAMIN) capsule Take 1 capsule by mouth daily.     Yes [provider]  mycophenolate (MYFORTIC) 180 MG EC tablet Take 180-360  mg by mouth 2 (two) times daily. Takes 2 in the morning and 1 at night   Yes [provider]  NOVOFINE 32G X 6 MM MISC as needed. Reported on 01/06/2016 03/11/13  Yes [provider]  omeprazole (PRILOSEC) 20 MG capsule Take 20 mg by mouth daily. 01/16/13  Yes [provider]  tacrolimus (PROGRAF) 1 MG capsule Take 1-2 mg by mouth 2 (two) times daily. Takes 2 in the morning and 1 at night   Yes [provider]  zinc gluconate 50 MG tablet Take 50 mg by mouth daily.   Yes [provider]    Inpatient Medications: Scheduled Meds:  albuterol  2.5 mg Nebulization BID   atorvastatin  20 mg Oral Daily   brimonidine  1 drop Right Eye BID   Chlorhexidine Gluconate Cloth  6 each Topical Daily   docusate  100 mg Per Tube BID   FLUoxetine  40 mg Oral q morning   free water  30 mL Per Tube Q4H   insulin aspart  0-9 Units Subcutaneous Q4H   insulin glargine-yfgn  9 Units Subcutaneous QHS   metoprolol tartrate  50 mg Oral BID   multivitamin  1 tablet Per Tube QHS   mycophenolate  180 mg Oral QHS   mycophenolate  360 mg Oral Daily   mouth rinse  15 mL Mouth Rinse Q2H   pantoprazole  40 mg Oral Daily   polyethylene glycol  17 g Per Tube Daily   tacrolimus  1 mg Oral QHS   tacrolimus  2 mg Oral Daily   vancomycin variable dose per unstable renal function (pharmacist dosing)   Does not apply See admin instructions   Continuous Infusions:  sodium chloride     [START ON 07/13/22] ceFEPime (MAXIPIME) IV     feeding supplement (VITAL AF 1.2 CAL) 20 mL/hr at 06/13/22 1400   fentaNYL infusion INTRAVENOUS 100 mcg/hr (06/13/22 1400)   lactated ringers Stopped (06/12/22 1054)   norepinephrine (LEVOPHED) Adult infusion 12 mcg/min (06/13/22 1437)   promethazine (PHENERGAN) injection (IM or IVPB) Stopped (06/10/22 1214)   propofol (DIPRIVAN) infusion Stopped (06/11/22 1505)   PRN Meds: acetaminophen **OR** acetaminophen, albuterol, guaiFENesin-dextromethorphan,  hydrOXYzine, midazolam, ondansetron **OR** ondansetron (ZOFRAN) IV, mouth rinse, promethazine (PHENERGAN) injection (IM or IVPB)  Allergies:    Allergies  Allergen Reactions   Latex Hives    REACTION: swelling Other reaction(s): Unknown   Other Other (See Comments)    [DERM]   Silicone     Other reaction(s): Other (See Comments) [DERM]    Social History:   Social History   Tobacco Use   Smoking status: Never   Smokeless tobacco: Never   Tobacco comments:    never used tobacco  Vaping Use   Vaping Use: Never used  Substance Use Topics   Alcohol use: No    Alcohol/week: 0.0 standard drinks of alcohol   Drug use: No    Family History:   Family History  Problem Relation Age of Onset   Stroke  Mother    Heart disease Sister    Breast cancer Sister 14   Heart disease Brother    Diabetes Brother    Diabetes Brother      ROS:  Unable to obtain as the patient is intubated and sedated.  Physical Exam/Data:   Vitals:   06/13/22 1415 06/13/22 1430 06/13/22 1445 06/13/22 1533  BP: (!) 88/59 (!) 83/51 (!) 87/60   Pulse: 83 82 86   Resp: (!) 25 (!) 22 15   Temp:      TempSrc:      SpO2: 93% 95% 92% 94%  Weight:      Height:        Intake/Output Summary (Last 24 hours) at 06/13/2022 1554 Last data filed at 06/13/2022 1301 Gross per 24 hour  Intake 1082.71 ml  Output 80 ml  Net 1002.71 ml      06/11/2022    5:00 AM 06/15/2022   12:06 PM 04/19/2022   10:39 AM  Last 3 Weights  Weight (lbs) 132 lb 4.4 oz 117 lb 1 oz 117 lb  Weight (kg) 60 kg 53.1 kg 53.071 kg     Body mass index is 20.72 kg/m.  General: Thin woman, lying in ICU.  She is intubated and unresponsive. HEENT: normal Neck: Unable to assess JVP due to patient positioning and support devices. Cardiac: Very distant heart sounds, barely audible and obscured by breath sounds.  No obvious murmurs appreciated. Lungs: Coarse breath sounds and serially. Abd: soft, nontender, no hepatomegaly  Ext: no  edema Musculoskeletal: No deformities.  Unable to assess movement and strength as the patient is intubated and sedated. Skin: warm and dry  Neuro: Intubated and sedated. Psych: Unable to assess as patient is intubated and sedated.  EKG:  The EKG was personally reviewed and demonstrates: Normal sinus rhythm with left bundle branch block.  This is new from prior tracing in our system on 10/30/2018. Telemetry:  Telemetry was personally reviewed and demonstrates: Normal sinus rhythm.  Relevant CV Studies: TTE (06/13/2022):  1. Left ventricular ejection fraction, by estimation, is 25 to 30%. The  left ventricle has severely decreased function. The left ventricle  demonstrates regional wall motion abnormalities (see scoring  diagram/findings for description). There is mild  asymmetric left ventricular hypertrophy of the basal-septal segment. Left  ventricular diastolic parameters are consistent with Grade II diastolic  dysfunction (pseudonormalization). Elevated left atrial pressure. There is  severe hypokinesis of the left  ventricular, mid-apical anteroseptal wall, anterior wall, anterolateral  wall and apical segment.   2. Right ventricular systolic function is normal. The right ventricular  size is normal.   3. Moderate pleural effusion in the left lateral region.   4. The mitral valve is degenerative. Moderate mitral valve regurgitation.  No evidence of mitral stenosis.   5. Tricuspid valve regurgitation is moderate to severe.   6. The aortic valve has an indeterminant number of cusps. There is severe  calcifcation of the aortic valve. There is severe thickening of the aortic  valve. Aortic valve regurgitation is not visualized. Moderate to severe  aortic valve stenosis. Aortic  valve area, by VTI measures 0.79 cm. Aortic valve mean gradient measures  21.0 mmHg.   Laboratory Data:  High Sensitivity Troponin:   Recent Labs  Lab 06/13/22 1429  TROPONINIHS 15,725*      Chemistry Recent Labs  Lab 06/11/22 0420 06/11/22 0546 06/12/22 0340 06/13/22 0458  NA 132* 129* 132* 131*  K 4.0 4.1 4.1 4.6  CL  100 100 102 102  CO2 16* 17* 19* 19*  GLUCOSE 242* 246* 150* 197*  BUN 65* 63* 68* 73*  CREATININE 2.34* 2.28* 3.17* 4.11*  CALCIUM 8.4* 8.1* 8.2* 8.1*  MG 2.3  --  2.3 2.4  GFRNONAA 21* 22* 15* 11*  ANIONGAP 16* _0 Recent Labs  Lab 06/06/2022 1213 06/10/22 1930 06/11/22 0546  PROT 7.1 7.1 6.5  ALBUMIN 3.4* 3.4* 3.0*  AST 23 36 38  ALT _1 ALKPHOS 63 67 63  BILITOT 1.1 0.6 0.8   Lipids  Recent Labs  Lab 06/12/22 0340  TRIG 131    Hematology Recent Labs  Lab 06/11/22 0546 06/12/22 0340 06/13/22 0458  WBC 14.4* 17.0* 16.4*  RBC 3.61* 3.66* 3.72*  HGB 10.1* 10.2* 10.4*  HCT 30.8* 31.8* 33.0*  MCV 85.3 86.9 88.7  MCH 28.0 27.9 28.0  MCHC 32.8 32.1 31.5  RDW 13.2 13.8 13.7  PLT 190 282 317   Thyroid No results for input(s): "TSH", "FREET4" in the last 168 hours.  BNPNo results for input(s): "BNP", "PROBNP" in the last 168 hours.  DDimer  Recent Labs  Lab 06/10/22 1930  DDIMER 1.72*     Radiology/Studies:  ECHOCARDIOGRAM COMPLETE  Result Date: 06/13/2022    ECHOCARDIOGRAM REPORT   Patient Name:   Jasmine Cummings Date of Exam: 06/13/2022 Medical Rec #:  [id number]       Height:       67.0 in Accession #:    [id number]      Weight:       132.3 lb Date of Birth:  [birth year]      BSA:          1.696 m Patient Age:    9 years        BP:           103/60 mmHg Patient Gender: F               HR:           84 bpm. Exam Location:  [facility] Procedure: 2D Echo, Cardiac Doppler and Color Doppler Indications:     Shock  History:         Patient has prior history of Echocardiogram examinations, most                  recent 08/09/2021. Risk Factors:Hypertension. CKD.  Sonographer:     Sherrie Sport Referring Phys:  [id number] Bradly Bienenstock Diagnosing Phys: Nelva Bush MD  Sonographer Comments: Echo performed with patient supine and  on artificial respirator. Image quality was good. IMPRESSIONS  1. Left ventricular ejection fraction, by estimation, is 25 to 30%. The left ventricle has severely decreased function. The left ventricle demonstrates regional wall motion abnormalities (see scoring diagram/findings for description). There is mild asymmetric left ventricular hypertrophy of the basal-septal segment. Left ventricular diastolic parameters are consistent with Grade II diastolic dysfunction (pseudonormalization). Elevated left atrial pressure. There is severe hypokinesis of the left ventricular, mid-apical anteroseptal wall, anterior wall, lateral wall and apical segment.  2. Right ventricular systolic function is normal. The right ventricular size is normal.  3. Moderate pleural effusion in the left lateral region.  4. The mitral valve is degenerative. Moderate mitral valve regurgitation. No evidence of mitral stenosis.  5. Tricuspid valve regurgitation is moderate to severe.  6. The aortic valve has an indeterminant number of cusps. There is severe calcifcation of the aortic valve. There is severe  thickening of the aortic valve. Aortic valve regurgitation is not visualized. Moderate to severe aortic valve stenosis. Aortic valve area, by VTI measures 0.79 cm. Aortic valve mean gradient measures 21.0 mmHg. FINDINGS  Left Ventricle: Left ventricular ejection fraction, by estimation, is 25 to 30%. The left ventricle has severely decreased function. The left ventricle demonstrates regional wall motion abnormalities. Severe hypokinesis of the left ventricular, mid-apical anteroseptal wall, anterior wall, lateral wall and apical segment. The left ventricular internal cavity size was normal in size. There is mild asymmetric left ventricular hypertrophy of the basal-septal segment. Left ventricular diastolic parameters are consistent with Grade II diastolic dysfunction (pseudonormalization). Elevated left atrial pressure. Right Ventricle: The  right ventricular size is normal. No increase in right ventricular wall thickness. Right ventricular systolic function is normal. Left Atrium: Left atrial size was normal in size. Right Atrium: Right atrial size was normal in size. Pericardium: There is no evidence of pericardial effusion. Mitral Valve: The mitral valve is degenerative in appearance. There is mild thickening of the mitral valve leaflet(s). Mild mitral annular calcification. Moderate mitral valve regurgitation. No evidence of mitral valve stenosis. Tricuspid Valve: The tricuspid valve is not well visualized. Tricuspid valve regurgitation is moderate to severe. Aortic Valve: The aortic valve has an indeterminant number of cusps. There is severe calcifcation of the aortic valve. There is severe thickening of the aortic valve. Aortic valve regurgitation is not visualized. Moderate to severe aortic stenosis is present. Aortic valve mean gradient measures 21.0 mmHg. Aortic valve peak gradient measures 31.7 mmHg. Aortic valve area, by VTI measures 0.79 cm. Pulmonic Valve: The pulmonic valve was not well visualized. Pulmonic valve regurgitation is trivial. No evidence of pulmonic stenosis. Aorta: The aortic root is normal in size and structure. Venous: IVC assessment for right atrial pressure unable to be performed due to mechanical ventilation. IAS/Shunts: No atrial level shunt detected by color flow Doppler. Additional Comments: There is a moderate pleural effusion in the left lateral region.  LEFT VENTRICLE PLAX 2D LVIDd:         4.30 cm      Diastology LVIDs:         3.10 cm      LV e' medial:    4.79 cm/s LV PW:         0.70 cm      LV E/e' medial:  29.2 LV IVS:        1.10 cm      LV e' lateral:   10.20 cm/s LVOT diam:     2.00 cm      LV E/e' lateral: 13.7 LV SV:         42 LV SV Index:   25 LVOT Area:     3.14 cm  LV Volumes (MOD) LV vol d, MOD A2C: 126.0 ml LV vol d, MOD A4C: 67.8 ml LV vol s, MOD A2C: 102.0 ml LV vol s, MOD A4C: 50.6 ml LV SV  MOD A2C:     24.0 ml LV SV MOD A4C:     67.8 ml LV SV MOD BP:      19.0 ml RIGHT VENTRICLE RV Basal diam:  3.40 cm RV Mid diam:    3.20 cm RV S prime:     11.30 cm/s TAPSE (M-mode): 2.4 cm LEFT ATRIUM             Index        RIGHT ATRIUM           Index  LA diam:        4.50 cm 2.65 cm/m   RA Area:     11.80 cm LA Vol (A2C):   26.2 ml 15.44 ml/m  RA Volume:   29.40 ml  17.33 ml/m LA Vol (A4C):   43.0 ml 25.35 ml/m LA Biplane Vol: 36.1 ml 21.28 ml/m  AORTIC VALVE AV Area (Vmax):    0.78 cm AV Area (Vmean):   0.74 cm AV Area (VTI):     0.79 cm AV Vmax:           281.67 cm/s AV Vmean:          206.000 cm/s AV VTI:            0.526 m AV Peak Grad:      31.7 mmHg AV Mean Grad:      21.0 mmHg LVOT Vmax:         70.30 cm/s LVOT Vmean:        48.500 cm/s LVOT VTI:          0.133 m LVOT/AV VTI ratio: 0.25  AORTA Ao Root diam: 3.30 cm MITRAL VALVE                TRICUSPID VALVE MV Area (PHT): 4.06 cm     TR Peak grad:   45.4 mmHg MV Decel Time: 187 msec     TR Vmax:        337.00 cm/s MV E velocity: 140.00 cm/s MV A velocity: 76.00 cm/s   SHUNTS MV E/A ratio:  1.84         Systemic VTI:  0.13 m                             Systemic Diam: 2.00 cm Nelva Bush MD Electronically signed by Nelva Bush MD Signature Date/Time: 06/13/2022/12:57:00 PM    Final    DG Chest Port 1 View  Result Date: 06/12/2022 CLINICAL DATA:  [id number] Encounter for central line placement [id number] EXAM: PORTABLE CHEST 1 VIEW COMPARISON:  Chest x-ray 05/12/2022, CT chest 06/06/2022 FINDINGS: Endotracheal tube terminates 2 cm above the carina. Enteric tube courses below the hemidiaphragm with tip and side port collimated off view. Left internal jugular central venous catheter with tip at the superior cavoatrial junction. The heart and mediastinal contours are unchanged. Aortic calcification. Bilateral, mid to lower lung zone predominant, patchy airspace opacities. No pulmonary edema. Bilateral at least trace to small volume pleural  effusions. No pneumothorax. No acute osseous abnormality. IMPRESSION: 1. Bilateral, mid to lower lung zone predominant, patchy airspace opacities. 2. Bilateral at least trace to small volume pleural effusions. 3. Lines and tubes as above. 4.  Aortic Atherosclerosis (ICD10-I70.0). Electronically Signed   By: Iven Finn M.D.   On: 06/12/2022 18:20   DG Abd 1 View  Result Date: 06/11/2022 CLINICAL DATA:  Nasogastric tube placement EXAM: ABDOMEN - 1 VIEW COMPARISON:  None Available. FINDINGS: Nasogastric tube tip overlies expected mid body of the stomach. The abdominal gas pattern is indeterminate due to a paucity of intra-abdominal gas. Small bilateral pleural effusions are present. Interstitial pulmonary infiltrates are seen at the right lung base. Vascular calcifications noted within the abdomen. IMPRESSION: 1. Nasogastric tube tip within the mid body of the stomach. Electronically Signed   By: Fidela Salisbury M.D.   On: 06/11/2022 03:18   DG Chest 1 View  Result Date: 06/11/2022 CLINICAL DATA:  Respiratory failure, intubation EXAM: CHEST  1 VIEW COMPARISON:  7:55 p.m. FINDINGS: Endotracheal tube seen 2 cm above the carina. The lungs are symmetrically well expanded and pulmonary insufflation is preserved. Small bilateral pleural effusions are present. Multifocal interstitial airspace infiltrates are again seen, more focal within the right perihilar region, stable since prior examination. No pneumothorax. Cardiac size within normal limits. No acute bone abnormality. IMPRESSION: 1. Endotracheal tube 2 cm above the carina. 2. Stable multifocal pulmonary infiltrate. 3. Small bilateral pleural effusions. Electronically Signed   By: Fidela Salisbury M.D.   On: 06/11/2022 03:17   DG Chest Port 1 View  Result Date: 06/10/2022 CLINICAL DATA:  Hypoxia. EXAM: PORTABLE CHEST 1 VIEW COMPARISON:  Radiograph and CT 06/18/2022 FINDINGS: Significant progression in bilateral airspace disease, more so on the right.  Enlarging bilateral pleural effusions. Associated bibasilar consolidations likely represent compressive atelectasis. Suspected septal thickening can be seen with pulmonary edema. Heart is prominent in size. No pneumothorax. IMPRESSION: 1. Significant progression in bilateral airspace disease, more so on the right. There also septal thickening, findings are suspicious for pulmonary edema, although multifocal pneumonia is also considered. 2. Enlarging bilateral pleural effusions. Bibasilar consolidations likely compressive atelectasis related to pleural effusions. Electronically Signed   By: Keith Rake M.D.   On: 06/10/2022 20:02     Assessment and Plan:   Shock: Patient is hypotensive requiring norepinephrine and demonstrating worsening renal function.  Echo today demonstrates severely reduced LVEF with LAD-territory wall motion abnormality.  High-sensitivity troponin I is also elevated at 15,000 with EKG demonstrating new left bundle branch block compared to prior tracing here in 2020 and EKG report from [facility] in 08/2021.  I worry that she may have had an MI contributing to her shock that may also have some underlying sepsis in the setting of her RSV infection and possible superimposed bacterial pneumonia.  However, central venous oxygen saturation of 70% argues against low output heart failure. -Continue norepinephrine to maintain MAP greater than 65.  In the setting of suspected acute MI as well as severe aortic stenosis, I would be reluctant to add inotrope at this time, as this would increase her myocardial oxygen demand and provide limited benefit given a central venous oxygen saturation of 70%. -Continue empiric antibiotics. -If patient declines further, right and left heart catheterization could be considered and placement of IABP if necessary.  However, the patient has previously stated she would not wish to be started on hemodialysis again; IV contrast associated with this procedure  would almost certainly increase her risk for worsening renal failure. -Continue to hold metoprolol in the setting of shock and likely severe AS.  Acute MI and acute HFrEF: Patient noted to have markedly elevated troponin with new LAD territory wall motion abnormality and LBBB by EKG.  I wonder if her worsening respiratory failure/hypoxia this admission may have also been due, at least in part, to acute MI and acute HFrEF.  Notes from earlier this admission do not make mention of angina.  First troponin drawn today at my request was quite elevated at 15,000. -Continue to trend troponin. -Initiate heparin infusion, to be continued for 48-72 hours. -Administer aspirin 324 mg x 1 followed by 81 mg daily. -Continue statin therapy as liver function allows. -As outlined above, I would defer adding inotrope at this point. -I have tried reaching out to Ms. Cobey's family to discuss aforementioned cardiac findings and role for cardiac catheterization.  Given her previously expressed desire to avoid hemodialysis at all cost, I would favor conservative therapy  over an invasive approach that would almost certainly worsen her already tenuous renal function. -Check CVP and consider gentle diuresis as renal function and blood pressure allow for target CVP of 12.  Aortic stenosis: Patient has known moderate to severe aortic stenosis.  Echo today is most consistent with severe low-flow/low gradient aortic stenosis. -Avoid vasodilators and negative inotropic agents, if possible. -If patient has meaningful recovery from her acute illness, outpatient consultation with valve team could be considered.  Mardelle Pandolfi-stage renal disease status post renal transplant: Creatinine continues to trend up, 4.1 this morning (1.5 on admission). -Avoid nephrotoxic agents. -Ongoing management of immunosuppression for transplant kidney per nephrology.  Acute respiratory failure with hypoxia and pneumonia: Initial inciting factor was  likely RSV pneumonia with possible bacterial superinfection and acute HFrEF. -Per CCM.  CRITICAL CARE Performed by: Nelva Bush, MD  Total critical care time: 50 minutes  Critical care time was exclusive of separately billable procedures and treating other patients.  Critical care was necessary to treat or prevent imminent or life-threatening deterioration.  Critical care was time spent personally by me (independent of midlevel providers or residents) on the following activities: development of treatment plan with patient and/or surrogate as well as nursing, discussions with consultants, evaluation of patient's response to treatment, examination of patient, obtaining history from patient or surrogate, ordering and performing treatments and interventions, ordering and review of laboratory studies, ordering and review of radiographic studies, pulse oximetry and re-evaluation of patient's condition.  ADDENDUM (06/13/22 4:30 PM): I spoke with Ms. Livingston daughter [facility] Jerline Pain) by phone regarding our concerns for acute MI and acute heart failure as well as severe aortic stenosis.  Ms. Jerline Pain reports that her mother has been complaining of progressive dyspnea on exertion and dizziness for at least 6 months.  She also reported some left-sided chest pain radiating to the left arm when she was admitted last week.  She had not been compliant with some of her MD follow-up over the last several months, including cardiology consultation scheduled for last month.  We have agreed that her mother would not wish to undergo dialysis and that invasive procedures such as left heart catheterization would significantly increase the risk of further renal injury.  We will therefore continue with medical therapy including heparin and vasopressor support.  For questions or updates, please contact [place] Please consult [email or web address] for contact info under [facility] Cardiology.  Signed, Nelva Bush, MD   06/13/2022 3:54 PM

## 2022-06-13 NOTE — Progress Notes (Signed)
Attending:    Subjective: Remains mechanically ventilated Remains on vasopressors Oliguric   Objective: Vitals:   06/13/22 0900 06/13/22 0915 06/13/22 0930 06/13/22 0945  BP: 110/66 114/61 114/66 109/61  Pulse: 82 84 85 84  Resp: (!) 26 (!) 27 (!) 21 (!) 30  Temp:      TempSrc:      SpO2: 96% 95% 94% 95%  Weight:      Height:       Vent Mode: PRVC FiO2 (%):  [40 %-60 %] 40 % Set Rate:  [25 bmp] 25 bmp Vt Set:  [420 mL] 420 mL PEEP:  [5 cmH20] 5 cmH20 Plateau Pressure:  [13 cmH20-18 cmH20] 18 cmH20  Intake/Output Summary (Last 24 hours) at 06/13/2022 1142 Last data filed at 06/13/2022 0901 Gross per 24 hour  Intake 1147.73 ml  Output 40 ml  Net 1107.73 ml    General:  In bed on vent HENT: NCAT ETT in place PULM: Rhonchi bilaterally R>L, vent supported breathing CV: RRR, no mgr GI: BS+, soft, nontender MSK: normal bulk and tone Neuro: sedated on vent    CBC    Component Value Date/Time   WBC 16.4 (H) 06/13/2022 0458   RBC 3.72 (L) 06/13/2022 0458   HGB 10.4 (L) 06/13/2022 0458   HGB 13.3 10/12/2016 0941   HCT 33.0 (L) 06/13/2022 0458   HCT 39.6 10/12/2016 0941   PLT 317 06/13/2022 0458   PLT 125 Platelet count consistent in citrate (L) 10/12/2016 0941   MCV 88.7 06/13/2022 0458   MCV 89 10/12/2016 0941   MCH 28.0 06/13/2022 0458   MCHC 31.5 06/13/2022 0458   RDW 13.7 06/13/2022 0458   RDW 12.4 10/12/2016 0941   LYMPHSABS 0.9 06/11/2022 0546   LYMPHSABS 1.2 10/12/2016 0941   MONOABS 1.2 (H) 06/11/2022 0546   MONOABS 0.7 12/11/2013 0447   EOSABS 0.0 06/11/2022 0546   EOSABS 0.3 10/12/2016 0941   BASOSABS 0.0 06/11/2022 0546   BASOSABS 0.0 10/12/2016 0941    BMET    Component Value Date/Time   NA 131 (L) 06/13/2022 0458   NA 136 02/02/2016 1014   K 4.6 06/13/2022 0458   K 4.5 02/02/2016 1014   CL 102 06/13/2022 0458   CL 106 12/11/2013 0447   CO2 19 (L) 06/13/2022 0458   CO2 28 02/02/2016 1014   GLUCOSE 197 (H) 06/13/2022 0458   GLUCOSE  170 (H) 02/02/2016 1014   BUN 73 (H) 06/13/2022 0458   BUN 20.4 02/02/2016 1014   CREATININE 4.11 (H) 06/13/2022 0458   CREATININE 1.1 02/02/2016 1014   CALCIUM 8.1 (L) 06/13/2022 0458   CALCIUM 9.3 02/02/2016 1014   GFRNONAA 11 (L) 06/13/2022 0458   GFRNONAA 33 (L) 12/11/2013 0447   GFRAA >60 10/30/2018 2049   GFRAA 38 (L) 12/11/2013 0447    CXR images none today  Impression: Acute respiratory failure with hypoxemia  RSV pneumonia Likely bacterial superinfection pneumonia, awaiting cultures AKI, worse S/p renal transplant  Immunocompromised Hyperglycemia  Plan: Continue vanc/cefepime for now F/u BAL culture Full mechanical vent support VAP prevention Daily WUA/SBT> trial daily Wean off levophed for MAP > 65 Nephrology to discuss HD with family.  Apparently the patient has indicated that she didn't want to go back on dialysis, but that was before this episode  Rest per NP note  My cc time 25 minutes Discussed plan on multidisciplinary rounds  Roselie Awkward, MD [place] Pager: [phone] Cell: [phone] After 7pm: [id number]

## 2022-06-13 NOTE — Progress Notes (Signed)
[facility], [place] 06/13/22  Subjective:   Hospital day # 6  Patient seen and evaluated at bedside in ICU Remains sedated on fentanyl Intubated on vent at 40% 1 pressor in place, levo Tube feeds at 40 mL/h, 30 mL free water flush every 4 hours Foley catheter in place Mild lower extremity edema Urine output 70 mL in preceding 24 hours  Renal: 12/11 0701 - 12/12 0700 In: 1373.4 [I.V.:886; NG/GT:375; IV Piggyback:102.4] Out: 70 [Urine:70] Lab Results  Component Value Date   CREATININE 4.11 (H) 06/13/2022   CREATININE 3.17 (H) 06/12/2022   CREATININE 2.28 (H) 06/11/2022     Objective:  Vital signs in last 24 hours:  Temp:  [98.3 F (36.8 C)-99.1 F (37.3 C)] 98.4 F (36.9 C) (12/12 1200) Pulse Rate:  [69-96] 78 (12/12 1215) Resp:  [19-30] 27 (12/12 1215) BP: (70-114)/(45-76) 84/55 (12/12 1215) SpO2:  [90 %-100 %] 92 % (12/12 1215) FiO2 (%):  [40 %-60 %] 40 % (12/12 1200)  Weight change:  Filed Weights   06/21/2022 1206 06/11/22 0500  Weight: 53.1 kg 60 kg    Intake/Output:    Intake/Output Summary (Last 24 hours) at 06/13/2022 1238 Last data filed at 06/13/2022 1226 Gross per 24 hour  Intake 1248.18 ml  Output 80 ml  Net 1168.18 ml      Physical Exam: General: Critically ill-appearing  HEENT Dry oral mucous membranes  Pulm/lungs Mild basilar coarse breath sounds, vent assisted  CVS/Heart No rub  Abdomen:  Soft, nontender  Extremities: Bilateral lower extremity edema  Neurologic: Currently intubated  Skin: No acute rashes          Basic Metabolic Panel:  Recent Labs  Lab 06/10/22 0532 06/10/22 1930 06/11/22 0420 06/11/22 0546 06/12/22 0340 06/13/22 0458  NA 131* 130* 132* 129* 132* 131*  K 3.7 4.0 4.0 4.1 4.1 4.6  CL 103 101 100 100 102 102  CO2 18* 16* 16* 17* 19* 19*  GLUCOSE 132* 247* 242* 246* 150* 197*  BUN 52* 64* 65* 63* 68* 73*  CREATININE 1.95* 2.40* 2.34* 2.28* 3.17* 4.11*  CALCIUM 8.2* 8.4* 8.4*  8.1* 8.2* 8.1*  MG 2.1 2.2 2.3  --  2.3 2.4  PHOS  --   --   --   --   --  6.5*      CBC: Recent Labs  Lab 06/24/2022 1213 06/08/22 0459 06/10/22 1930 06/11/22 0420 06/11/22 0546 06/12/22 0340 06/13/22 0458  WBC 7.3   < > 13.2* 17.0* 14.4* 17.0* 16.4*  NEUTROABS 5.0  --  11.6*  --  12.3*  --   --   HGB 10.4*   < > 10.2* 10.8* 10.1* 10.2* 10.4*  HCT 32.4*   < > 31.1* 32.7* 30.8* 31.8* 33.0*  MCV 86.6   < > 85.0 83.8 85.3 86.9 88.7  PLT 151   < > 212 223 190 282 317   < > = values in this interval not displayed.      No results found for: "HEPBSAG", "HEPBSAB", "HEPBIGM"    Microbiology:  Recent Results (from the past 240 hour(s))  Resp panel by RT-PCR (RSV, Flu A&B, Covid) Anterior Nasal Swab     Status: Abnormal   Collection Time: 06/16/2022 12:13 PM   Specimen: Anterior Nasal Swab  Result Value Ref Range Status   SARS Coronavirus 2 by RT PCR NEGATIVE NEGATIVE Final    Comment: (NOTE) SARS-CoV-2 target nucleic acids are NOT DETECTED.  The SARS-CoV-2 RNA is generally  detectable in upper respiratory specimens during the acute phase of infection. The lowest concentration of SARS-CoV-2 viral copies this assay can detect is 138 copies/mL. A negative result does not preclude SARS-Cov-2 infection and should not be used as the sole basis for treatment or other patient management decisions. A negative result may occur with  improper specimen collection/handling, submission of specimen other than nasopharyngeal swab, presence of viral mutation(s) within the areas targeted by this assay, and inadequate number of viral copies(<138 copies/mL). A negative result must be combined with clinical observations, patient history, and epidemiological information. The expected result is Negative.  Fact Sheet for Patients:  EntrepreneurPulse.com.au  Fact Sheet for Healthcare Providers:  IncredibleEmployment.be  This test is no t yet approved or  cleared by the [place] FDA and  has been authorized for detection and/or diagnosis of SARS-CoV-2 by FDA under an Emergency Use Authorization (EUA). This EUA will remain  in effect (meaning this test can be used) for the duration of the COVID-19 declaration under Section 564(b)(1) of the Act, 21 U.S.C.section 360bbb-3(b)(1), unless the authorization is terminated  or revoked sooner.       Influenza A by PCR NEGATIVE NEGATIVE Final   Influenza B by PCR NEGATIVE NEGATIVE Final    Comment: (NOTE) The Xpert Xpress SARS-CoV-2/FLU/RSV plus assay is intended as an aid in the diagnosis of influenza from Nasopharyngeal swab specimens and should not be used as a sole basis for treatment. Nasal washings and aspirates are unacceptable for Xpert Xpress SARS-CoV-2/FLU/RSV testing.  Fact Sheet for Patients: EntrepreneurPulse.com.au  Fact Sheet for Healthcare Providers: IncredibleEmployment.be  This test is not yet approved or cleared by the [place] FDA and has been authorized for detection and/or diagnosis of SARS-CoV-2 by FDA under an Emergency Use Authorization (EUA). This EUA will remain in effect (meaning this test can be used) for the duration of the COVID-19 declaration under Section 564(b)(1) of the Act, 21 U.S.C. section 360bbb-3(b)(1), unless the authorization is terminated or revoked.     Resp Syncytial Virus by PCR POSITIVE (A) NEGATIVE Final    Comment: (NOTE) Fact Sheet for Patients: EntrepreneurPulse.com.au  Fact Sheet for Healthcare Providers: IncredibleEmployment.be  This test is not yet approved or cleared by the [place] FDA and has been authorized for detection and/or diagnosis of SARS-CoV-2 by FDA under an Emergency Use Authorization (EUA). This EUA will remain in effect (meaning this test can be used) for the duration of the COVID-19 declaration under Section 564(b)(1) of the Act,  21 U.S.C. section 360bbb-3(b)(1), unless the authorization is terminated or revoked.  Performed at [facility], [address] [place], [place] [address]   Culture, blood (Routine X 2) w Reflex to ID Panel     Status: None (Preliminary result)   Collection Time: 06/10/22 11:47 PM   Specimen: BLOOD RIGHT HAND  Result Value Ref Range Status   Specimen Description BLOOD RIGHT HAND  Final   Special Requests   Final    BOTTLES DRAWN AEROBIC AND ANAEROBIC Blood Culture adequate volume   Culture   Final    NO GROWTH 2 DAYS Performed at [facility], [address] [place], [place] [address]    Report Status PENDING  Incomplete  Culture, blood (Routine X 2) w Reflex to ID Panel     Status: None (Preliminary result)   Collection Time: 06/10/22 11:47 PM   Specimen: BLOOD RIGHT ARM  Result Value Ref Range Status   Specimen Description BLOOD RIGHT ARM  Final  Special Requests   Final    BOTTLES DRAWN AEROBIC AND ANAEROBIC Blood Culture adequate volume   Culture   Final    NO GROWTH 2 DAYS Performed at [facility], [address] [place], [place] [address]    Report Status PENDING  Incomplete  MRSA Next Gen by PCR, Nasal     Status: None   Collection Time: 06/11/22  2:06 PM   Specimen: Nasal Mucosa; Nasal Swab  Result Value Ref Range Status   MRSA by PCR Next Gen NOT DETECTED NOT DETECTED Final    Comment: (NOTE) The GeneXpert MRSA Assay (FDA approved for NASAL specimens only), is one component of a comprehensive MRSA colonization surveillance program. It is not intended to diagnose MRSA infection nor to guide or monitor treatment for MRSA infections. Test performance is not FDA approved in patients less than 14 years old. Performed at [facility], [address] [place], [place] [address]   Culture, Respiratory w Gram Stain     Status: None (Preliminary result)   Collection Time: 06/12/22 11:20 AM   Specimen: Bronchoalveolar Lavage;  Respiratory  Result Value Ref Range Status   Specimen Description   Final    BRONCHIAL ALVEOLAR LAVAGE Performed at [facility], [address] [place], [place] [address]    Special Requests   Final    NONE Performed at [facility], [place], [place] [address]    Gram Stain   Final    RARE WBC PRESENT,BOTH PMN AND MONONUCLEAR NO ORGANISMS SEEN    Culture   Final    NO GROWTH < 24 HOURS Performed at [facility], [place] [address] [place], [place] [address]    Report Status PENDING  Incomplete    Coagulation Studies: No results for input(s): "LABPROT", "INR" in the last 72 hours.  Urinalysis: Recent Labs    06/11/22 0052  COLORURINE YELLOW*  LABSPEC 1.021  PHURINE 5.0  GLUCOSEU NEGATIVE  HGBUR NEGATIVE  BILIRUBINUR NEGATIVE  KETONESUR 5*  PROTEINUR 30*  NITRITE NEGATIVE  LEUKOCYTESUR NEGATIVE       Imaging: DG Chest Port 1 View  Result Date: 06/12/2022 CLINICAL DATA:  [id number] Encounter for central line placement [id number] EXAM: PORTABLE CHEST 1 VIEW COMPARISON:  Chest x-ray 05/12/2022, CT chest 06/11/2022 FINDINGS: Endotracheal tube terminates 2 cm above the carina. Enteric tube courses below the hemidiaphragm with tip and side port collimated off view. Left internal jugular central venous catheter with tip at the superior cavoatrial junction. The heart and mediastinal contours are unchanged. Aortic calcification. Bilateral, mid to lower lung zone predominant, patchy airspace opacities. No pulmonary edema. Bilateral at least trace to small volume pleural effusions. No pneumothorax. No acute osseous abnormality. IMPRESSION: 1. Bilateral, mid to lower lung zone predominant, patchy airspace opacities. 2. Bilateral at least trace to small volume pleural effusions. 3. Lines and tubes as above. 4.  Aortic Atherosclerosis (ICD10-I70.0). Electronically Signed   By: Iven Finn M.D.   On: 06/12/2022 18:20     Medications:    sodium  chloride     [START ON Jul 01, 2022] ceFEPime (MAXIPIME) IV     feeding supplement (VITAL AF 1.2 CAL) 20 mL/hr at 06/13/22 1226   fentaNYL infusion INTRAVENOUS 100 mcg/hr (06/13/22 1226)   lactated ringers Stopped (06/12/22 1054)   norepinephrine (LEVOPHED) Adult infusion 12 mcg/min (06/13/22 1226)   promethazine (PHENERGAN) injection (IM or IVPB) Stopped (06/10/22 1214)   propofol (DIPRIVAN) infusion Stopped (06/11/22 1505)  albuterol  2.5 mg Nebulization BID   atorvastatin  20 mg Oral Daily   brimonidine  1 drop Right Eye BID   Chlorhexidine Gluconate Cloth  6 each Topical Daily   docusate  100 mg Per Tube BID   FLUoxetine  40 mg Oral q morning   free water  30 mL Per Tube Q4H   heparin injection (subcutaneous)  5,000 Units Subcutaneous Q8H   insulin aspart  0-9 Units Subcutaneous Q4H   insulin glargine-yfgn  9 Units Subcutaneous QHS   metoprolol tartrate  50 mg Oral BID   multivitamin  1 tablet Per Tube QHS   mycophenolate  180 mg Oral QHS   mycophenolate  360 mg Oral Daily   mouth rinse  15 mL Mouth Rinse Q2H   pantoprazole  40 mg Oral Daily   polyethylene glycol  17 g Per Tube Daily   tacrolimus  1 mg Oral QHS   tacrolimus  2 mg Oral Daily   vancomycin variable dose per unstable renal function (pharmacist dosing)   Does not apply See admin instructions   acetaminophen **OR** acetaminophen, albuterol, guaiFENesin-dextromethorphan, hydrOXYzine, midazolam, ondansetron **OR** ondansetron (ZOFRAN) IV, mouth rinse, promethazine (PHENERGAN) injection (IM or IVPB)  Assessment/ Plan:  76 y.o. female with  diabetes type 2, paroxysmal atrial fibrillation, mild cognitive deficits and kidney transplant in 2013, who was  admitted on 06/09/2022 for Hypoxia [R09.02] RSV infection [B33.8] Pneumonia due to respiratory syncytial virus (RSV) [J12.1] Community acquired pneumonia, unspecified laterality [J18.9]  1. 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  Renal transplant status.   Patient states baseline creatinine 1.2.  Currently followed by [facility] in [place].  Patient has received deceased donor renal transplant in 2013 at [facility].  Kidney failure related to diabetes.  Continue home immunosuppressive regimen which includes, mycophenolate 360 mg in the morning and 180 mg at night and tacrolimus 2 mg in the morning and 1 mg at night.   -Renal function continues to decline along with urine output. During a visit last week, patient stated to this provider and nephrology attending that she did not want to do dialysis. Due to current condition, this provider contacted daughter, who was seen at bedside, who confirms patient's desire to not proceed with dialysis. Patient's daughter feels that even pursuing temporary dialysis would not be the wishes of her mother. We will honor the patient's wishes and not pursue dialysis at this time.   Lab Results  Component Value Date   CREATININE 4.11 (H) 06/13/2022   CREATININE 3.17 (H) 06/12/2022   CREATININE 2.28 (H) 06/11/2022   12/11 0701 - 12/12 0700 In: 1373.4 [I.V.:886; NG/GT:375; IV Piggyback:102.4] Out: 70 [Urine:70]    2.  Hyponatremia, likely due to poor oral intake.  Sodium remains slightly decreased 131.   3.  Acute metabolic acidosis.  Likely secondary to acute kidney injury.  Serum bicarbonate remains 19.  Continue to monitor for now.   4.  Acute respiratory failure/pneumonia due to RSV.  IV antibiotics managed by primary team, currently on cefepime.  Patient maintained on mechanical ventilation.   LOS: [place] 12/12/202312:38 PM  [facility] [place], [place]

## 2022-06-13 NOTE — Progress Notes (Signed)
*  PRELIMINARY RESULTS* Echocardiogram 2D Echocardiogram has been performed.  Jasmine Cummings 06/13/2022, 9:43 AM

## 2022-06-13 NOTE — Progress Notes (Signed)
Pharmacy Antibiotic Note  Jasmine Cummings is a 76 y.o. female admitted on 06/27/2022 with pneumonia.  Pharmacy has been consulted for Vancomycin and Cefepime dosing.  Acute on Chronic kidney disease , not on HD.  RSV detected on virus panel.  Concern for superimposed bacterial pneumonia.  Patient PMH of renal transplan in 2013 on tacrolimus and mycophenolate.    Today, 06/13/2022 Day # 7 antibiotics, Day #3 vanco/cefepime Renal - SCR worsening to 4.11 (from 1.48 at baseline) WBC 16.4 (largely unchanged) Afebrile Vancomycin level 12/11 at 03:40 was 10 mcg/ml with previous dose of vanco 1213m givne x 1 12/9 at 23:50. Vancomycin 7510mgivne 12/11 at 23:25 BAL performed 12/11, culture pending Continues to require vasopressor and ventilator support  Plan: Adjust cefepime to 1gm IV q24h for worsening renal function Stop schedule vancomycin doses and check random vancomycin level 12/13  Follow BAL culture and need to continue vancomycin Monitor renal function closely  Height: _0  (170.2 cm) Weight: 60 kg (132 lb 4.4 oz) IBW/kg (Calculated) : 61.6  Temp (24hrs), Avg:98.8 F (37.1 C), Min:98.3 F (36.8 C), Max:99.1 F (37.3 C)  Recent Labs  Lab 06/10/22 1930 06/10/22 2220 06/11/22 0420 06/11/22 0546 06/11/22 0722 06/12/22 0340 06/13/22 0458  WBC 13.2*  --  17.0* 14.4*  --  17.0* 16.4*  CREATININE 2.40*  --  2.34* 2.28*  --  3.17* 4.11*  LATICACIDVEN 3.1* 1.7  --  1.1 1.0  --   --   VANCORANDOM  --   --   --   --   --  10  --      Estimated Creatinine Clearance: 10.9 mL/min (A) (by C-G formula based on SCr of 4.11 mg/dL (H)).    Allergies  Allergen Reactions   Latex Hives    REACTION: swelling Other reaction(s): Unknown   Other Other (See Comments)    [DERM]   Silicone     Other reaction(s): Other (See Comments) [DERM]    Antimicrobials this admission: Azith 12/6 >> 12/8 Ceftriaxone 12/6 >>12/9 Cefepime 12/10 >> Vancomycin 12/9 >>  Dose adjustments this  admission:   Microbiology results: 12/11 BAL cx: 12/11 AFB from BAL: 12/10 MRSA PCR neg 12/9 Bcx NGTD 12/6 RVP: + RSV   Thank you for allowing pharmacy to be a part of this patient's care.  DuDoreene ElandPharmD, BCPS, BCIDP Work Cell: [phone]/06/2022 11:25 AM

## 2022-06-13 NOTE — Progress Notes (Signed)
[facility] for Electrolyte Monitoring and Replacement   Recent Labs: Potassium  Date Value  06/13/2022 4.6 mmol/L  02/02/2016 4.5 mEq/L   Magnesium (mg/dL)  Date Value  06/13/2022 2.4  12/11/2013 2.3   Calcium (mg/dL)  Date Value  06/13/2022 8.1 (L)  02/02/2016 9.3   Albumin (g/dL)  Date Value  06/11/2022 3.0 (L)  02/02/2016 4.0   Phosphorus (mg/dL)  Date Value  06/13/2022 6.5 (H)   Sodium  Date Value  06/13/2022 131 mmol/L (L)  02/02/2016 136 mEq/L   Corrected Calcium: 8.9 mg/dL  Assessment: 76 year old female with past medical history of kidney transplant 2013 (on immunosuppressants- tacrolimus and mycophenolate), dementia, paroxysmal atrial fibrillation, and insulin dependent T2DM. Admitted with +RSV with superimposed bacterial pneumonia ISO immunocompromised state, acute on chronic hyponatremia secondary to decreased oral intake and AKI with metabolic acidosis. Currently intubated on 50% FiO2 and sedated on propofol. Receiving pressor support with norepinephrine @ 2 mcg/min. Pharmacy has been consulted to manage electrolytes while patient is under PCCM care.  Goal of Therapy:  K > 4 Mag > 2 Na to baseline (nephrology consulted) All other electrolytes within normal limits  Plan:  No replacement warranted Continue LR '@50'$  mL/hr (per nephrology & PCCM) Follow up labs tomorrow AM   Pearla Dubonnet, PharmD Clinical Pharmacist 06/13/2022 7:28 AM

## 2022-06-13 NOTE — Progress Notes (Signed)
NAME:  Jasmine Cummings, MRN:  [id number], DOB:  06-12-1946, LOS: 6 ADMISSION DATE:  06/27/2022, CONSULTATION DATE:  06/11/2022 REFERRING MD:  Enzo Bi  CHIEF COMPLAINT: Respiratory Failure   Brief Pt Description / Synopsis  76 y.o. female admitted with Severe Sepsis with septic shock and Acute Hypoxic Respiratory Failure in the setting of RSV Pneumonia & suspected superimposed Aspiration/Bacterial Pneumonia in immunocompromised patient requiring intubation and mechanical ventilation.  Also with AKI superimposed on CKD Stage III (hx of renal transplant).  HPI  76 y.o female with significant PMH of Kidney transplant (2013) on immunosuppressants, MCI, PAF, T2DM, and HTN who presented to the ED with chief complaints of C/O two weeks of body aches, c/o chills, cough - non productive. nausea x 2 days and left sided pain x 1 month. Sates exposed to RSV and flu by grandson.   ED Course: Initial vital signs showed HR of 65 beats/minute, BP 132/65 mm Hg, the RR 16 breaths/minute, and the oxygen saturation 92 % on and a temperature of 98.62F (36.9C).   Pertinent Labs/Diagnostics Findings: Chemistry:Na+/ K+: 129/4.4  Glucose: 293 BUN/Cr: 34/1.49  CBC: Unremarkable Other Lab findings: PCT: 1.43  COVID PCR: Negative,  RSV: Positive  Imaging: CTA Chest Abd/pelvis>1. Small bilateral pleural effusions. 2. Patchy areas of airspace disease as above, most pronounced within the lower lobes and right upper lobe. Favor multifocal pneumonia given clinical presentation. Continued follow up after appropriate medical management is recommended to document resolution. 3. Right lower quadrant transplant kidney, with nonobstructing renal calculi measuring up to 7 mm.  Hospital Course: Patient given 30 cc/kg of fluids and started on broad-spectrum antibiotics Ceftriaxone and Azithromycin for suspected sepsis secondary. Patient admitted to [facility] unit under hospitalist service. See significant events below.  Past Medical  History  Kidney transplant (2013) on immunosuppressants, MCI, PAF, T2DM, and HTN    Significant Hospital Events   12/6: Admitted to [facility] unit with acute hypoxic respiratory failure secondary to RSV pneumonia 12/8: Nephrology consulted due to ESRD and history of deceased donor kidney transplant 12/8: Rapid response called for respiratory distress with tachypnea, use of assessor muscle and hypoxia 74% on 4 L.  Patient placed on BiPAP and transferred to the ICU. 12/9: Remained on BiPAP, however with worsening respiratory distress, increased work of breathing and JVD, BiPAP setting adjusted gradually with prior to up to 100% with sats still in the 80s.  Due to worsening respiratory failure and now patient altered with increased work of breathing patient was emergently intubated for airway protection. 12/11: Worsening AKI with Oliguria. Nephrology following. Worsening Leukocytosis, plan for Bronchoscopy.  Obtain Echo due to persistent shock.  Code status changed to DNR/DNI per daughter's request. 12/12: Remains on pressors (15 mcg Levo), vent settings decreased to 40% fio2 & 5 PEEP.  Worsening AKI with oligruia, pt would not wish to pursue HD.  Echo with severe decrease in LVEF to 25-30%, moderate to severe AS.  Cardiology consulted, checking EKG, troponin, and Coox panel. Consult Palliative Care.  Consults:  Nephrology Cardiology Palliative Care  Procedures:  12/9: Intubation 12/11: Bronchoscopy  Significant Diagnostic Tests:  12/9 CXR IMPRESSION: 1. Significant progression in bilateral airspace disease, more so on the right. There also septal thickening, findings are suspicious for pulmonary edema, although multifocal pneumonia is also considered. 2. Enlarging bilateral pleural effusions. Bibasilar consolidations likely compressive atelectasis related to pleural effusions.  12/6: CT Chest, abdomen and pelvis> IMPRESSION: 1. Small bilateral pleural effusions. 2. Patchy areas of  airspace disease  as above, most pronounced within the lower lobes and right upper lobe. Favor multifocal pneumonia given clinical presentation. Continued follow up after appropriate medical management is recommended to document resolution. 3. Right lower quadrant transplant kidney, with nonobstructing renal calculi measuring up to 7 mm. 4. Calcification of the aortic and mitral valves. 5. Aortic Atherosclerosis (ICD10-I70.0). Coronary artery atherosclerosis.  Micro Data:  12/6: SARS-CoV-2 PCR> negative 12/6: Influenza PCR> negative 12/6: RSV> POSITIVE 12/9: Blood culture x2> NGTD 12/9: MRSA PCR>> negative 12/10: Strep pneumo urinary antigen>negative 12/10: Legionella urinary antigen> 12/11: BAL>>  Antimicrobials:  Vancomycin 12/10> Cefepime 12/10>  OBJECTIVE  Blood pressure (!) 100/57, pulse 80, temperature 98.3 F (36.8 C), temperature source Axillary, resp. rate (!) 27, height _0  (1.702 m), weight 60 kg, SpO2 92 %.    Vent Mode: PRVC FiO2 (%):  [40 %-60 %] 40 % Set Rate:  [25 bmp] 25 bmp Vt Set:  [420 mL] 420 mL PEEP:  [5 cmH20] 5 cmH20 Plateau Pressure:  [13 cmH20-18 cmH20] 18 cmH20   Intake/Output Summary (Last 24 hours) at 06/13/2022 0842 Last data filed at 06/13/2022 0800 Gross per 24 hour  Intake 1365.91 ml  Output 65 ml  Net 1300.91 ml    Filed Weights   06/10/2022 1206 06/11/22 0500  Weight: 53.1 kg 60 kg   Physical Examination  GENERAL: 76 year-old critically ill patient lying in the bed intubated and sedated, in NAD EYES: Pupils equal, round, reactive to light and accommodation. No scleral icterus. Extraocular muscles intact.  HEENT: Head atraumatic, normocephalic. Oropharynx and nasopharynx clear.  orally intubated NECK:  Supple, + jugular venous distention. No thyroid enlargement, no tenderness.  LUNGS: Coarse breath sounds bilaterally, no wheezing,synchronous with the vent, even, nonlabored CARDIOVASCULAR: RRR, S1, S2 normal. No murmurs, rubs, or  gallops.  ABDOMEN: Soft, nontender, nondistended. Bowel sounds present. No organomegaly or mass.  EXTREMITIES: Upper and lower extremities are atraumatic in appearance without tenderness or deformity. Lower extremities Bilateral pitting edema  Capillary refill is less than 3 seconds in all extremities. Pulses palpable.  NEUROLOGIC:Sedated, withdraws from pain but not following commands, pupils PERRL  PSYCHIATRIC The patient is intubated and sedated SKIN: No obvious rash, lesion, or ulcer.   Labs/imaging that I havepersonally reviewed  (right click and "Reselect all SmartList Selections" daily)     Labs   CBC: Recent Labs  Lab 06/25/2022 1213 06/08/22 0459 06/10/22 1930 06/11/22 0420 06/11/22 0546 06/12/22 0340 06/13/22 0458  WBC 7.3   < > 13.2* 17.0* 14.4* 17.0* 16.4*  NEUTROABS 5.0  --  11.6*  --  12.3*  --   --   HGB 10.4*   < > 10.2* 10.8* 10.1* 10.2* 10.4*  HCT 32.4*   < > 31.1* 32.7* 30.8* 31.8* 33.0*  MCV 86.6   < > 85.0 83.8 85.3 86.9 88.7  PLT 151   < > 212 223 190 282 317   < > = values in this interval not displayed.     Basic Metabolic Panel: Recent Labs  Lab 06/10/22 0532 06/10/22 1930 06/11/22 0420 06/11/22 0546 06/12/22 0340 06/13/22 0458  NA 131* 130* 132* 129* 132* 131*  K 3.7 4.0 4.0 4.1 4.1 4.6  CL 103 101 100 100 102 102  CO2 18* 16* 16* 17* 19* 19*  GLUCOSE 132* 247* 242* 246* 150* 197*  BUN 52* 64* 65* 63* 68* 73*  CREATININE 1.95* 2.40* 2.34* 2.28* 3.17* 4.11*  CALCIUM 8.2* 8.4* 8.4* 8.1* 8.2* 8.1*  MG 2.1 2.2 2.3  --  2.3 2.4  PHOS  --   --   --   --   --  6.5*    GFR: Estimated Creatinine Clearance: 10.9 mL/min (A) (by C-G formula based on SCr of 4.11 mg/dL (H)). Recent Labs  Lab 06/10/22 1930 06/10/22 2220 06/11/22 0420 06/11/22 0546 06/11/22 0722 06/12/22 0340 06/13/22 0458  PROCALCITON 1.80  --   --  1.86  --  2.80 4.45  WBC 13.2*  --  17.0* 14.4*  --  17.0* 16.4*  LATICACIDVEN 3.1* 1.7  --  1.1 1.0  --   --      Liver  Function Tests: Recent Labs  Lab 06/11/2022 1213 06/10/22 1930 06/11/22 0546  AST 23 36 38  ALT _0 ALKPHOS 63 67 63  BILITOT 1.1 0.6 0.8  PROT 7.1 7.1 6.5  ALBUMIN 3.4* 3.4* 3.0*    Recent Labs  Lab 06/11/2022 1213  LIPASE 27    No results for input(s): "AMMONIA" in the last 168 hours.  ABG    Component Value Date/Time   PHART 7.34 (L) 06/11/2022 0355   PCO2ART 32 06/11/2022 0355   PO2ART 137 (H) 06/11/2022 0355   HCO3 17.3 (L) 06/11/2022 0355   ACIDBASEDEF 7.4 (H) 06/10/2022 2133   O2SAT 99 06/11/2022 0355     Coagulation Profile: No results for input(s): "INR", "PROTIME" in the last 168 hours.  Cardiac Enzymes: No results for input(s): "CKTOTAL", "CKMB", "CKMBINDEX", "TROPONINI" in the last 168 hours.  HbA1C: Hemoglobin A1C  Date/Time Value Ref Range Status  12/11/2013 04:47 AM 8.3 (H) 4.2 - 6.3 % Final    Comment:    The American Diabetes Association recommends that a primary goal of therapy should be <7% and that physicians should reevaluate the treatment regimen in patients with HbA1c values consistently >8%.    Hgb A1c MFr Bld  Date/Time Value Ref Range Status  06/25/2022 12:13 PM 7.3 (H) 4.8 - 5.6 % Final    Comment:    (NOTE)         Prediabetes: 5.7 - 6.4         Diabetes: >6.4         Glycemic control for adults with diabetes: <7.0     CBG: Recent Labs  Lab 06/12/22 1921 06/12/22 1940 06/12/22 2353 06/13/22 0341 06/13/22 0725  GLUCAP 159* 161* 158* 150* 203*     Review of Systems:   UNABLE TO OBTAIN PATIENT INTUBATED AND SEDATED  Past Medical History  She,  has a past medical history of Anemia, Anxiety, Arthritis, Chronic kidney disease, Depression, Diabetes mellitus, Diabetic retinopathy ([place]), GERD (gastroesophageal reflux disease), Hypertension, Memory impairment, Obsessive compulsive disorder, Osteopenia (03/2018), and Thrombocytopenia ([place]) (05/31/2015).   Surgical History    Past Surgical History:  Procedure  Laterality Date   APPENDECTOMY     BREAST EXCISIONAL BIOPSY Bilateral    CATARACT EXTRACTION     CESAREAN SECTION  1968   KIDNEY TRANSPLANT Right 09/2011   RETINAL DETACHMENT SURGERY Left 2006   TUBAL LIGATION  1981     Social History   reports that she has never smoked. She has never used smokeless tobacco. She reports that she does not drink alcohol and does not use drugs.   Family History   Her family history includes Breast cancer (age of onset: 69) in her sister; Diabetes in her brother and brother; Heart disease in her brother and sister; Stroke in her mother.   Allergies Allergies  Allergen Reactions  Latex Hives    REACTION: swelling Other reaction(s): Unknown   Other Other (See Comments)    [DERM]   Silicone     Other reaction(s): Other (See Comments) [DERM]    Home Medications  Prior to Admission medications   Medication Sig Start Date End Date Taking? Authorizing Provider  ALPRAZolam (XANAX) 0.25 MG tablet one po bid Oral   Yes [provider]  amLODipine (NORVASC) 10 MG tablet Take 10 mg by mouth at bedtime.   Yes [provider]  atorvastatin (LIPITOR) 20 MG tablet Take 20 mg by mouth daily. 04/26/22  Yes [provider]  brimonidine (ALPHAGAN) 0.2 % ophthalmic solution Place 1 drop into the right eye 2 (two) times daily. 07/20/21  Yes [provider]  Continuous Blood Gluc Sensor (FREESTYLE LIBRE SENSOR SYSTEM) MISC FreeStyle Libre 14 Day Sensor kit  CHANGE EVERY 14 DAYS DX E10.29   Yes [provider]  FLUoxetine (PROZAC) 40 MG capsule Take 40 mg by mouth every morning.    Yes [provider]  insulin aspart (NOVOLOG) 100 UNIT/ML injection Inject 12 Units into the skin 4 (four) times daily. Sliding Scale   Yes [provider]  Insulin Glargine (LANTUS [place]) Inject 9 Units into the skin daily. 9 units in AM   Yes [provider]  metoprolol tartrate (LOPRESSOR) 25 MG tablet Take 50 mg by mouth  2 (two) times daily.  01/30/13  Yes [provider]  Multiple Vitamin (MULTIVITAMIN) capsule Take 1 capsule by mouth daily.     Yes [provider]  mycophenolate (MYFORTIC) 180 MG EC tablet Take 180-360 mg by mouth 2 (two) times daily. Takes 2 in the morning and 1 at night   Yes [provider]  NOVOFINE 32G X 6 MM MISC as needed. Reported on 01/06/2016 03/11/13  Yes [provider]  omeprazole (PRILOSEC) 20 MG capsule Take 20 mg by mouth daily. 01/16/13  Yes [provider]  tacrolimus (PROGRAF) 1 MG capsule Take 1-2 mg by mouth 2 (two) times daily. Takes 2 in the morning and 1 at night   Yes [provider]  zinc gluconate 50 MG tablet Take 50 mg by mouth daily.   Yes [provider]  Scheduled Meds:  albuterol  2.5 mg Nebulization BID   atorvastatin  20 mg Oral Daily   brimonidine  1 drop Right Eye BID   Chlorhexidine Gluconate Cloth  6 each Topical Daily   docusate  100 mg Per Tube BID   FLUoxetine  40 mg Oral q morning   free water  30 mL Per Tube Q4H   heparin injection (subcutaneous)  5,000 Units Subcutaneous Q8H   insulin aspart  0-9 Units Subcutaneous Q4H   insulin glargine-yfgn  9 Units Subcutaneous QHS   metoprolol tartrate  50 mg Oral BID   multivitamin  1 tablet Per Tube QHS   mycophenolate  180 mg Oral QHS   mycophenolate  360 mg Oral Daily   mouth rinse  15 mL Mouth Rinse Q2H   pantoprazole  40 mg Oral Daily   polyethylene glycol  17 g Per Tube Daily   tacrolimus  1 mg Oral QHS   tacrolimus  2 mg Oral Daily   Continuous Infusions:  sodium chloride     ceFEPime (MAXIPIME) IV Stopped (06/13/22 0111)   feeding supplement (VITAL AF 1.2 CAL) 20 mL/hr at 06/13/22 0800   fentaNYL infusion INTRAVENOUS 100 mcg/hr (06/13/22 0800)   lactated ringers Stopped (  06/12/22 1054)   norepinephrine (LEVOPHED) Adult infusion 15 mcg/min (06/13/22 0800)   promethazine (PHENERGAN) injection (IM or IVPB) Stopped (06/10/22 1214)    propofol (DIPRIVAN) infusion Stopped (06/11/22 1505)   vancomycin Stopped (06/12/22 2328)   PRN Meds:.acetaminophen **OR** acetaminophen, albuterol, guaiFENesin-dextromethorphan, hydrOXYzine, midazolam, ondansetron **OR** ondansetron (ZOFRAN) IV, mouth rinse, promethazine (PHENERGAN) injection (IM or IVPB)  Active Hospital Problem list   See below  Assessment & Plan:   #Acute Hypoxic Respiratory Failure due to Severe RSV Multifocal Pneumonia & Acute HFrEF -Full vent support, implement lung protective strategies -Plateau pressures less than 30 cm H20 -Wean FiO2 & PEEP as tolerated to maintain O2 sats >92% -Follow intermittent Chest X-ray & ABG as needed -Spontaneous Breathing Trials when respiratory parameters met and mental status permits -Implement VAP Bundle -Bronchodilators -ABX as above -S/p Bronch 12/11  #Shock: Septic +/- Cardiogenic #Acute Decompensated HFrEF #Moderate to Severe Aortic Stenosis #Moderate to Severe Tricuspid Regurgitation #Paroxsymal Atrial Fibrillation (not on anticoagulation) Echo 06/13/22: LVEF 25-30%, Grade I DD, RV systolic function normal, moderate MR, moderate to severe TR, moderate to severe AS -Continuous cardiac monitoring -Maintain MAP >65 -Vasopressors as needed to maintain MAP goal -Check Coox panel, may potentially need inotrope -Check EKG and troponin -Consult Cardiology, appreciate input  #Severe Sepsis in the setting of RSV Pneumonia & suspected superimposed Aspiration/Bacterial Pneumonia in immunocompromised patient Meets SIRS Criteria  -Monitor fever curve -Trend WBC's & Procalcitonin -Follow cultures as above -Continue empiric Vancomycin and Cefepime pending cultures & sensitivities   #AKI on CKD stage III, likely ATN in the setting of above ~ WORSENING #Mild Hyponatremia #Acute metabolic acidosis PMHx: Deceased donor renal transplant (2013) Baseline Creatinine 1.2 -Monitor I&O's / urinary output -Follow BMP -Ensure adequate  renal perfusion -Avoid nephrotoxic agents as able -Replace electrolytes as indicated -Pharmacy following for assistance with electrolyte replacement -Gentle IV fluids -Nephrology following, appreciate input ~ Nephrology discussed with pt's daughter, discussed with her Nephrologist last week that she would NOT WANT DIALYSIS.  Pt's daughter confirms that she does not wish to purse dialysis  #Diabetes mellitus HgbA1c  -CBG's AC & hs; Target range of 140 to 180 -SSI -Follow ICU Hypo/Hyperglycemia protocol  #Acute Metabolic Encephalopathy  #Sedation Needs in setting of mechanical ventilation Hx: MCI -Treatment of metabolic derangements and sepsis as outlined above -Maintain a RASS goal of 0 to -1 -Fentanyl and Propofol as needed to maintain RASS goal -Avoid sedating medications as able -Daily wake up assessment    Pt is critically ill with multiorgan failure.  High risk for decompensation, cardiac arrest and death.  Prognosis is guarded.  Discussion with pt's daughter U[place]ses [place], she reports her mother has specifically said to her in the past that she would want to be DNR.  Denice requests that we change code status to DNR, continue to treat the treatable for now and see how clinical course trends.  Pt also would not wish to pursue Hemodialysis.  Palliative Care consulted for assistance with further goals of care.   Best practice:  Diet:  NPO, start tube feeds Pain/Anxiety/Delirium protocol (if indicated): Yes (RASS goal -1) VAP protocol (if indicated): Yes DVT prophylaxis: Heparin SQ GI prophylaxis: N/A Glucose control:  SSI Yes Central venous access:  yes, and is still needed Arterial line:  N/A Foley:  Yes, and it is still needed Mobility:  bed rest  PT consulted: N/A Last date of multidisciplinary goals of care discussion [12/12] Code Status:  full code Disposition: ICU  12/12: Updated pt's daughter Denice  via telephone.   Critical care time: 40 minutes     Darel Hong, AGACNP-BC [facility] Prefer epic messenger for cross cover needs If after hours, please call E-link

## 2022-06-14 ENCOUNTER — Inpatient Hospital Stay: Payer: Medicare Other

## 2022-06-14 DIAGNOSIS — I5020 Unspecified systolic (congestive) heart failure: Secondary | ICD-10-CM | POA: Diagnosis not present

## 2022-06-14 DIAGNOSIS — J121 Respiratory syncytial virus pneumonia: Secondary | ICD-10-CM | POA: Diagnosis not present

## 2022-06-14 DIAGNOSIS — I214 Non-ST elevation (NSTEMI) myocardial infarction: Secondary | ICD-10-CM

## 2022-06-14 LAB — BLOOD GAS, VENOUS
Acid-base deficit: 13 mmol/L — ABNORMAL HIGH (ref 0.0–2.0)
Bicarbonate: 15.3 mmol/L — ABNORMAL LOW (ref 20.0–28.0)
O2 Saturation: 69.6 %
Patient temperature: 37
pCO2, Ven: 43 mmHg — ABNORMAL LOW (ref 44–60)
pH, Ven: 7.16 — CL (ref 7.25–7.43)
pO2, Ven: 44 mmHg (ref 32–45)

## 2022-06-14 LAB — RENAL FUNCTION PANEL
Albumin: 2.2 g/dL — ABNORMAL LOW (ref 3.5–5.0)
Anion gap: 10 (ref 5–15)
BUN: 85 mg/dL — ABNORMAL HIGH (ref 8–23)
CO2: 16 mmol/L — ABNORMAL LOW (ref 22–32)
Calcium: 7.8 mg/dL — ABNORMAL LOW (ref 8.9–10.3)
Chloride: 104 mmol/L (ref 98–111)
Creatinine, Ser: 4.8 mg/dL — ABNORMAL HIGH (ref 0.44–1.00)
GFR, Estimated: 9 mL/min — ABNORMAL LOW (ref >60)
Glucose, Bld: 321 mg/dL — ABNORMAL HIGH (ref 70–99)
Phosphorus: 6.2 mg/dL — ABNORMAL HIGH (ref 2.5–4.6)
Potassium: 4.9 mmol/L (ref 3.5–5.1)
Sodium: 130 mmol/L — ABNORMAL LOW (ref 135–145)

## 2022-06-14 LAB — CBC
HCT: 31.4 % — ABNORMAL LOW (ref 36.0–46.0)
Hemoglobin: 9.7 g/dL — ABNORMAL LOW (ref 12.0–15.0)
MCH: 27.6 pg (ref 26.0–34.0)
MCHC: 30.9 g/dL (ref 30.0–36.0)
MCV: 89.2 fL (ref 80.0–100.0)
Platelets: 330 10*3/uL (ref 150–400)
RBC: 3.52 MIL/uL — ABNORMAL LOW (ref 3.87–5.11)
RDW: 14 % (ref 11.5–15.5)
WBC: 28.2 10*3/uL — ABNORMAL HIGH (ref 4.0–10.5)
nRBC: 0.2 % (ref 0.0–0.2)

## 2022-06-14 LAB — GLUCOSE, CAPILLARY
Glucose-Capillary: 250 mg/dL — ABNORMAL HIGH (ref 70–99)
Glucose-Capillary: 291 mg/dL — ABNORMAL HIGH (ref 70–99)
Glucose-Capillary: 308 mg/dL — ABNORMAL HIGH (ref 70–99)
Glucose-Capillary: 310 mg/dL — ABNORMAL HIGH (ref 70–99)
Glucose-Capillary: 337 mg/dL — ABNORMAL HIGH (ref 70–99)

## 2022-06-14 LAB — ACID FAST SMEAR (AFB, MYCOBACTERIA): Acid Fast Smear: NEGATIVE

## 2022-06-14 LAB — TROPONIN I (HIGH SENSITIVITY): Troponin I (High Sensitivity): 13495 ng/L (ref <18)

## 2022-06-14 LAB — HEPARIN LEVEL (UNFRACTIONATED)
Heparin Unfractionated: 0.19 IU/mL — ABNORMAL LOW (ref 0.30–0.70)
Heparin Unfractionated: 0.24 IU/mL — ABNORMAL LOW (ref 0.30–0.70)
Heparin Unfractionated: 0.33 IU/mL (ref 0.30–0.70)

## 2022-06-14 LAB — PHOSPHORUS: Phosphorus: 6.3 mg/dL — ABNORMAL HIGH (ref 2.5–4.6)

## 2022-06-14 LAB — VANCOMYCIN, RANDOM: Vancomycin Rm: 13 ug/mL

## 2022-06-14 LAB — TACROLIMUS LEVEL: Tacrolimus (FK506) - LabCorp: 11.4 ng/mL (ref 2.0–20.0)

## 2022-06-14 MED ORDER — INSULIN ASPART 100 UNIT/ML IJ SOLN
3.0000 [IU] | INTRAMUSCULAR | Status: DC
  Administered 2022-06-14 (×2): 3 [IU] via SUBCUTANEOUS
  Filled 2022-06-14 (×2): qty 1

## 2022-06-14 MED ORDER — HEPARIN BOLUS VIA INFUSION
1800.0000 [IU] | Freq: Once | INTRAVENOUS | Status: AC
  Administered 2022-06-14: 1800 [IU] via INTRAVENOUS
  Filled 2022-06-14: qty 1800

## 2022-06-14 MED ORDER — LINEZOLID 600 MG/300ML IV SOLN
600.0000 mg | Freq: Two times a day (BID) | INTRAVENOUS | Status: DC
  Administered 2022-06-14: 600 mg via INTRAVENOUS
  Filled 2022-06-14: qty 300

## 2022-06-14 MED ORDER — GLYCOPYRROLATE 0.2 MG/ML IJ SOLN: 0.2000 mg | INTRAMUSCULAR | Status: DC | PRN

## 2022-06-14 MED ORDER — SODIUM CHLORIDE 0.9 % IV SOLN: INTRAVENOUS | Status: DC

## 2022-06-14 MED ORDER — POLYVINYL ALCOHOL 1.4 % OP SOLN: 1.0000 [drp] | Freq: Four times a day (QID) | OPHTHALMIC | Status: DC | PRN

## 2022-06-14 MED ORDER — PANTOPRAZOLE SODIUM 40 MG IV SOLR
40.0000 mg | INTRAVENOUS | Status: DC
  Administered 2022-06-14: 40 mg via INTRAVENOUS
  Filled 2022-06-14: qty 10

## 2022-06-14 MED ORDER — SODIUM BICARBONATE 8.4 % IV SOLN
INTRAVENOUS | Status: AC
  Filled 2022-06-14: qty 100

## 2022-06-14 MED ORDER — MORPHINE BOLUS VIA INFUSION: 5.0000 mg | INTRAVENOUS | Status: DC | PRN

## 2022-06-14 MED ORDER — MORPHINE 100MG IN NS 100ML (1MG/ML) PREMIX INFUSION
0.0000 mg/h | INTRAVENOUS | Status: DC
  Administered 2022-06-14: 5 mg/h via INTRAVENOUS
  Filled 2022-06-14: qty 100

## 2022-06-14 MED ORDER — INSULIN ASPART 100 UNIT/ML IJ SOLN
0.0000 [IU] | INTRAMUSCULAR | Status: DC
  Administered 2022-06-14: 4 [IU] via SUBCUTANEOUS
  Administered 2022-06-14: 5 [IU] via SUBCUTANEOUS
  Administered 2022-06-14: 11 [IU] via SUBCUTANEOUS
  Filled 2022-06-14 (×3): qty 1

## 2022-06-14 MED ORDER — SODIUM BICARBONATE 8.4 % IV SOLN
150.0000 meq | Freq: Once | INTRAVENOUS | Status: AC
  Administered 2022-06-14: 150 meq via INTRAVENOUS
  Filled 2022-06-14: qty 50

## 2022-06-15 LAB — CULTURE, BLOOD (ROUTINE X 2)
Culture: NO GROWTH
Culture: NO GROWTH
Special Requests: ADEQUATE
Special Requests: ADEQUATE

## 2022-06-15 LAB — CULTURE, RESPIRATORY W GRAM STAIN

## 2022-07-03 NOTE — Progress Notes (Signed)
Palliative: Consult received and chart reviewed.  Discussed with ICU providers -family had conversation with critical care NP and expressed desire for comfort care.  Goals are clear at this point and there are no palliative needs at this time.  ICU providers will reach out if palliative needs arise.  Juel Burrow, DNP, AGNP-C Palliative Medicine Team Team Phone # [phone]  Pager # [phone]  NO CHARGE

## 2022-07-03 NOTE — Progress Notes (Signed)
NAME:  JOEANN STEPPE, MRN:  [id number], DOB:  1945-11-13, LOS: 7 ADMISSION DATE:  06/26/2022, CONSULTATION DATE:  06/11/2022 REFERRING MD:  Enzo Bi  CHIEF COMPLAINT: Respiratory Failure   Brief Pt Description / Synopsis  77 y.o. female admitted with Severe Sepsis with septic shock and Acute Hypoxic Respiratory Failure in the setting of RSV Pneumonia & suspected superimposed Aspiration/Bacterial Pneumonia in immunocompromised patient requiring intubation and mechanical ventilation.  Also with AKI superimposed on CKD Stage III (hx of renal transplant).  HPI  77 y.o female with significant PMH of Kidney transplant (2013) on immunosuppressants, MCI, PAF, T2DM, and HTN who presented to the ED with chief complaints of C/O two weeks of body aches, c/o chills, cough - non productive. nausea x 2 days and left sided pain x 1 month. Sates exposed to RSV and flu by grandson.   ED Course: Initial vital signs showed HR of 65 beats/minute, BP 132/65 mm Hg, the RR 16 breaths/minute, and the oxygen saturation 92 % on and a temperature of 98.28F (36.9C).   Pertinent Labs/Diagnostics Findings: Chemistry:Na+/ K+: 129/4.4  Glucose: 293 BUN/Cr: 34/1.49  CBC: Unremarkable Other Lab findings: PCT: 1.43  COVID PCR: Negative,  RSV: Positive  Imaging: CTA Chest Abd/pelvis>1. Small bilateral pleural effusions. 2. Patchy areas of airspace disease as above, most pronounced within the lower lobes and right upper lobe. Favor multifocal pneumonia given clinical presentation. Continued follow up after appropriate medical management is recommended to document resolution. 3. Right lower quadrant transplant kidney, with nonobstructing renal calculi measuring up to 7 mm.  Hospital Course: Patient given 30 cc/kg of fluids and started on broad-spectrum antibiotics Ceftriaxone and Azithromycin for suspected sepsis secondary. Patient admitted to [facility] unit under hospitalist service. See significant events below.  Past Medical  History  Kidney transplant (2013) on immunosuppressants, MCI, PAF, T2DM, and HTN   Significant Hospital Events   12/6: Admitted to [place] unit with acute hypoxic respiratory failure secondary to RSV pneumonia 12/8: Nephrology consulted due to ESRD and history of deceased donor kidney transplant 12/8: Rapid response called for respiratory distress with tachypnea, use of assessor muscle and hypoxia 74% on 4 L.  Patient placed on BiPAP and transferred to the ICU. 12/9: Remained on BiPAP, however with worsening respiratory distress, increased work of breathing and JVD, BiPAP setting adjusted gradually with prior to up to 100% with sats still in the 80s.  Due to worsening respiratory failure and now patient altered with increased work of breathing patient was emergently intubated for airway protection. 12/11: Worsening AKI with Oliguria. Nephrology following. Worsening Leukocytosis, plan for Bronchoscopy.  Obtain Echo due to persistent shock.  Code status changed to DNR/DNI per daughter's request. 12/12: Remains on pressors (15 mcg Levo), vent settings decreased to 40% fio2 & 5 PEEP.  Worsening AKI with oligruia, pt would not wish to pursue HD.  Echo with severe decrease in LVEF to 25-30%, moderate to severe AS.  Cardiology consulted, checking EKG, troponin, and Coox panel. Consult Palliative Care. 12/13: Pt remains mechanically intubated vent settings: PEEP 5/FiO2 40%.  Levophed gtt _0  mcg/min. Pt with worsening renal failure and remains oliguric   Consults:  Nephrology Cardiology Palliative Care  Procedures:  12/09: Intubation 12/11: Bronchoscopy 12/11: Left IJ CVL   Significant Diagnostic Tests:  12/9 CXR IMPRESSION: 1. Significant progression in bilateral airspace disease, more so on the right. There also septal thickening, findings are suspicious for pulmonary edema, although multifocal pneumonia is also considered. 2. Enlarging bilateral pleural effusions. Bibasilar  consolidations likely compressive atelectasis related to pleural effusions.  12/6: CT Chest, abdomen and pelvis> IMPRESSION: 1. Small bilateral pleural effusions. 2. Patchy areas of airspace disease as above, most pronounced within the lower lobes and right upper lobe. Favor multifocal pneumonia given clinical presentation. Continued follow up after appropriate medical management is recommended to document resolution. 3. Right lower quadrant transplant kidney, with nonobstructing renal calculi measuring up to 7 mm. 4. Calcification of the aortic and mitral valves. 5. Aortic Atherosclerosis (ICD10-I70.0). Coronary artery atherosclerosis.  Micro Data:  12/6: SARS-CoV-2 PCR> negative 12/6: Influenza PCR> negative 12/6: RSV> POSITIVE 12/9: Blood culture x2> NGTD 12/9: MRSA PCR>> negative 12/10: Strep pneumo urinary antigen>negative 12/10: Legionella urinary antigen> 12/11: BAL>>negative   Antimicrobials:  Azithromycin 12/7>>12/9 Ceftriaxone 12/6>>12/9 Vancomycin 12/10> Cefepime 12/10>  OBJECTIVE  Blood pressure (!) 95/56, pulse 88, temperature 98.9 F (37.2 C), resp. rate (!) 27, height _0  (1.702 m), weight 60.8 kg, SpO2 91 %. CVP:  [12 mmHg] 12 mmHg  Vent Mode: PRVC FiO2 (%):  [40 %] 40 % Set Rate:  [25 bmp] 25 bmp Vt Set:  [420 mL] 420 mL PEEP:  [5 cmH20] 5 cmH20 Plateau Pressure:  [15 cmH20] 15 cmH20   Intake/Output Summary (Last 24 hours) at 14-Jul-2022 0750 Last data filed at 07/14/2022 0600 Gross per 24 hour  Intake 1132.7 ml  Output 105 ml  Net 1027.7 ml   Filed Weights   06/28/2022 1206 06/11/22 0500 2022/07/14 0500  Weight: 53.1 kg 60 kg 60.8 kg   Physical Examination  GENERAL:Acute on chronically-ill female, NAD mechanically intubated   HEENT: Supple, mild JVD, orally intubated LUNGS: Coarse breath sounds bilaterally, no wheezing,synchronous with the vent, even, nonlabored CARDIOVASCULAR: NSR, rrr, s1s2, no r/g, 2+ radial/1+ distal pulses, 2+ bilateral  lower extremity pitting edema  ABDOMEN: +BS x4, soft, non tender, non distended  EXTREMITIES: Normal bulk and tone NEUROLOGIC: Sedated, RASS -3, not following commands, PERRL SKIN: No obvious rash, lesion, or ulcer.   Labs/imaging that I havepersonally reviewed  (right click and "Reselect all SmartList Selections" daily)     Labs   CBC: Recent Labs  Lab 06/12/2022 1213 06/08/22 0459 06/10/22 1930 06/11/22 0420 06/11/22 0546 06/12/22 0340 06/13/22 0458 07/14/2022 0413  WBC 7.3   < > 13.2* 17.0* 14.4* 17.0* 16.4* 28.2*  NEUTROABS 5.0  --  11.6*  --  12.3*  --   --   --   HGB 10.4*   < > 10.2* 10.8* 10.1* 10.2* 10.4* 9.7*  HCT 32.4*   < > 31.1* 32.7* 30.8* 31.8* 33.0* 31.4*  MCV 86.6   < > 85.0 83.8 85.3 86.9 88.7 89.2  PLT 151   < > 212 223 190 282 317 330   < > = values in this interval not displayed.    Basic Metabolic Panel: Recent Labs  Lab 06/10/22 0532 06/10/22 1930 06/11/22 0420 06/11/22 0546 06/12/22 0340 06/13/22 0458 Jul 14, 2022 0413  NA 131* 130* 132* 129* 132* 131* 130*  K 3.7 4.0 4.0 4.1 4.1 4.6 4.9  CL 103 101 100 100 102 102 104  CO2 18* 16* 16* 17* 19* 19* 16*  GLUCOSE 132* 247* 242* 246* 150* 197* 321*  BUN 52* 64* 65* 63* 68* 73* 85*  CREATININE 1.95* 2.40* 2.34* 2.28* 3.17* 4.11* 4.80*  CALCIUM 8.2* 8.4* 8.4* 8.1* 8.2* 8.1* 7.8*  MG 2.1 2.2 2.3  --  2.3 2.4  --   PHOS  --   --   --   --   --  6.5* 6.3*  6.2*   GFR: Estimated Creatinine Clearance: 9.4 mL/min (A) (by C-G formula based on SCr of 4.8 mg/dL (H)). Recent Labs  Lab 06/10/22 1930 06/10/22 2220 06/11/22 0420 06/11/22 0546 06/11/22 0722 06/12/22 0340 06/13/22 0458 07/13/2022 0413  PROCALCITON 1.80  --   --  1.86  --  2.80 4.45  --   WBC 13.2*  --    < > 14.4*  --  17.0* 16.4* 28.2*  LATICACIDVEN 3.1* 1.7  --  1.1 1.0  --   --   --    < > = values in this interval not displayed.    Liver Function Tests: Recent Labs  Lab 06/12/2022 1213 06/10/22 1930 06/11/22 0546 07-13-22 0413   AST 23 36 38  --   ALT _0 --   ALKPHOS 63 67 63  --   BILITOT 1.1 0.6 0.8  --   PROT 7.1 7.1 6.5  --   ALBUMIN 3.4* 3.4* 3.0* 2.2*   Recent Labs  Lab 06/03/2022 1213  LIPASE 27   No results for input(s): "AMMONIA" in the last 168 hours.  ABG    Component Value Date/Time   PHART 7.34 (L) 06/11/2022 0355   PCO2ART 32 06/11/2022 0355   PO2ART 137 (H) 06/11/2022 0355   HCO3 17.3 (L) 06/11/2022 0355   ACIDBASEDEF 7.4 (H) 06/10/2022 2133   O2SAT 70.3 06/13/2022 1341     Coagulation Profile: Recent Labs  Lab 06/13/22 1635  INR 1.5*    Cardiac Enzymes: No results for input(s): "CKTOTAL", "CKMB", "CKMBINDEX", "TROPONINI" in the last 168 hours.  HbA1C: Hemoglobin A1C  Date/Time Value Ref Range Status  12/11/2013 04:47 AM 8.3 (H) 4.2 - 6.3 % Final    Comment:    The American Diabetes Association recommends that a primary goal of therapy should be <7% and that physicians should reevaluate the treatment regimen in patients with HbA1c values consistently >8%.    Hgb A1c MFr Bld  Date/Time Value Ref Range Status  06/04/2022 12:13 PM 7.3 (H) 4.8 - 5.6 % Final    Comment:    (NOTE)         Prediabetes: 5.7 - 6.4         Diabetes: >6.4         Glycemic control for adults with diabetes: <7.0     CBG: Recent Labs  Lab 06/13/22 1529 06/13/22 1918 Jul 13, 2022 0004 07-13-2022 0412 07/13/2022 0743  GLUCAP 213* 274* 291* 310* 308*    Review of Systems:   UNABLE TO OBTAIN PATIENT INTUBATED AND SEDATED  Past Medical History  She,  has a past medical history of Anemia, Anxiety, Arthritis, Chronic kidney disease, Depression, Diabetes mellitus, Diabetic retinopathy ([place]), GERD (gastroesophageal reflux disease), Hypertension, Memory impairment, Obsessive compulsive disorder, Osteopenia (03/2018), and Thrombocytopenia ([place]) (05/31/2015).   Surgical History    Past Surgical History:  Procedure Laterality Date   APPENDECTOMY     BREAST EXCISIONAL BIOPSY Bilateral     CATARACT EXTRACTION     CESAREAN SECTION  1968   KIDNEY TRANSPLANT Right 09/2011   RETINAL DETACHMENT SURGERY Left 2006   TUBAL LIGATION  1981     Social History   reports that she has never smoked. She has never used smokeless tobacco. She reports that she does not drink alcohol and does not use drugs.   Family History   Her family history includes Breast cancer (age of onset: 12) in her sister; Diabetes in her brother and brother;  Heart disease in her brother and sister; Stroke in her mother.   Allergies Allergies  Allergen Reactions   Latex Hives    REACTION: swelling Other reaction(s): Unknown   Other Other (See Comments)    [DERM]   Silicone     Other reaction(s): Other (See Comments) [DERM]    Home Medications  Prior to Admission medications   Medication Sig Start Date End Date Taking? Authorizing Provider  ALPRAZolam (XANAX) 0.25 MG tablet one po bid Oral   Yes [provider]  amLODipine (NORVASC) 10 MG tablet Take 10 mg by mouth at bedtime.   Yes [provider]  atorvastatin (LIPITOR) 20 MG tablet Take 20 mg by mouth daily. 04/26/22  Yes [provider]  brimonidine (ALPHAGAN) 0.2 % ophthalmic solution Place 1 drop into the right eye 2 (two) times daily. 07/20/21  Yes [provider]  Continuous Blood Gluc Sensor (FREESTYLE LIBRE SENSOR SYSTEM) MISC FreeStyle Libre 14 Day Sensor kit  CHANGE EVERY 14 DAYS DX E10.29   Yes [provider]  FLUoxetine (PROZAC) 40 MG capsule Take 40 mg by mouth every morning.    Yes [provider]  insulin aspart (NOVOLOG) 100 UNIT/ML injection Inject 12 Units into the skin 4 (four) times daily. Sliding Scale   Yes [provider]  Insulin Glargine (LANTUS [place]) Inject 9 Units into the skin daily. 9 units in AM   Yes [provider]  metoprolol tartrate (LOPRESSOR) 25 MG tablet Take 50 mg by mouth 2 (two) times daily.  01/30/13  Yes [provider]  Multiple  Vitamin (MULTIVITAMIN) capsule Take 1 capsule by mouth daily.     Yes [provider]  mycophenolate (MYFORTIC) 180 MG EC tablet Take 180-360 mg by mouth 2 (two) times daily. Takes 2 in the morning and 1 at night   Yes [provider]  NOVOFINE 32G X 6 MM MISC as needed. Reported on 01/06/2016 03/11/13  Yes [provider]  omeprazole (PRILOSEC) 20 MG capsule Take 20 mg by mouth daily. 01/16/13  Yes [provider]  tacrolimus (PROGRAF) 1 MG capsule Take 1-2 mg by mouth 2 (two) times daily. Takes 2 in the morning and 1 at night   Yes [provider]  zinc gluconate 50 MG tablet Take 50 mg by mouth daily.   Yes [provider]  Scheduled Meds:  albuterol  2.5 mg Nebulization BID   aspirin  81 mg Per Tube Daily   atorvastatin  20 mg Oral Daily   brimonidine  1 drop Right Eye BID   Chlorhexidine Gluconate Cloth  6 each Topical Daily   docusate  100 mg Per Tube BID   FLUoxetine  40 mg Oral q morning   free water  30 mL Per Tube Q4H   insulin aspart  0-9 Units Subcutaneous Q4H   insulin glargine-yfgn  9 Units Subcutaneous QHS   multivitamin  1 tablet Per Tube QHS   mycophenolate  180 mg Oral QHS   mycophenolate  360 mg Oral Daily   mouth rinse  15 mL Mouth Rinse Q2H   pantoprazole  40 mg Oral Daily   polyethylene glycol  17 g Per Tube Daily   tacrolimus  1 mg Oral QHS   tacrolimus  2 mg Oral Daily   vancomycin variable dose per unstable renal function (pharmacist dosing)   Does not apply See admin instructions   Continuous Infusions:  sodium chloride     ceFEPime (MAXIPIME) IV 200  mL/hr at 05-Jul-2022 0600   feeding supplement (VITAL AF 1.2 CAL) 20 mL/hr at 2022-07-05 0600   fentaNYL infusion INTRAVENOUS 100 mcg/hr (07-05-2022 0600)   heparin 900 Units/hr (July 05, 2022 1607)   lactated ringers Stopped (06/12/22 1054)   norepinephrine (LEVOPHED) Adult infusion 13 mcg/min (07/05/22 0600)   promethazine (PHENERGAN) injection (IM or IVPB) Stopped  (06/10/22 1214)   propofol (DIPRIVAN) infusion Stopped (06/11/22 1505)   PRN Meds:.acetaminophen **OR** acetaminophen, albuterol, guaiFENesin-dextromethorphan, hydrOXYzine, midazolam, ondansetron **OR** ondansetron (ZOFRAN) IV, mouth rinse, promethazine (PHENERGAN) injection (IM or IVPB)  Active Hospital Problem list   See below  Assessment & Plan:   Acute Hypoxic Respiratory Failure due to Severe RSV Multifocal Pneumonia & Acute HFrEF - Full vent support, implement lung protective strategies - Plateau pressures less than 30 cm H20 - Wean FiO2 & PEEP as tolerated to maintain O2 sats >92% - Follow intermittent Chest X-ray & ABG as needed - Spontaneous Breathing Trials when respiratory parameters met and mental status permits - Implement VAP Bundle - Scheduled and prn bronchodilator therapy  - ABX as above - S/p Bronch 12/11  Shock: Septic +/- Cardiogenic Acute Decompensated HFrEF Acute MI  Aortic stenosis  Paroxsymal Atrial Fibrillation  Echo 06/13/22: LVEF 25-30%, Grade I DD, RV systolic function normal, moderate MR, moderate to severe TR, moderate to severe AS - Continuous cardiac monitoring - Repeat troponin pending  - Prn levophed gtt to maintain map >65 - Continue heparin gtt for total of 48-72 hrs  - Continue aspirin  - Consult Cardiology, appreciate input: recommends avoiding vasodilators and negative inotropic agents - Target CVP of 12  Severe Sepsis in the setting of RSV Pneumonia & suspected superimposed Aspiration/Bacterial Pneumonia in immunocompromised patient Meets SIRS Criteria  - Trend WBC and monitor fever curve - Trend PCT  - Follow cultures - Continue abx therapy as outlined above pending results and sensitivities    AKI on CKD stage III, likely ATN in the setting of above~worsening  Mild Hyponatremia Acute metabolic acidosis PMHx: Deceased donor renal transplant (2013) Baseline Creatinine 1.2 - Trend BMP  - Replace electrolytes as indicated  -  Monitor UOP  - Avoid nephrotoxic medications  - Nephrology following, appreciate input ~ Nephrology discussed with pt's daughter, pt discussed with her Nephrologist last week that she would NOT WANT DIALYSIS.  Pt's daughter confirms that she does not wish to purse dialysis  Diabetes mellitus HgbA1c 12/6: 7.3 - CBG's q4hrs  - Target range of 140 to 180 - Changed SSI to moderate scale  - Continue scheduled semglee - Diabetes coordinator consulted appreciate input  - Follow ICU Hypo/Hyperglycemia protocol  Acute Metabolic Encephalopathy  Sedation Needs in setting of mechanical ventilation Hx: MCI - Treatment of metabolic derangements and sepsis as outlined above - Maintain a RASS goal of 0 to -1 - Fentanyl and propofol as needed to maintain RASS goal - Avoid sedating medications as able - Daily wake up assessment    Pt is critically ill with multiorgan failure.  High risk for decompensation, cardiac arrest and death.  Prognosis is guarded.  Discussion with pt's daughter U[place], she reports her mother has specifically said to her in the past that she would want to be DNR.  Denice requests that we change code status to DNR, continue to treat the treatable for now and see how clinical course trends.  Pt also would not wish to pursue Hemodialysis.  Palliative Care consulted for assistance with further goals of care. Best practice:  Diet:  NPO,  TF's  Pain/Anxiety/Delirium protocol (if indicated): Yes (RASS goal -1) VAP protocol (if indicated): Yes DVT prophylaxis: Continue heparin gtt  GI prophylaxis: PPI  Glucose control:  SSI Yes Central venous access:  yes, and is still needed Arterial line:  N/A Foley:  Yes, and it is still needed Mobility:  bed rest  PT consulted: N/A Last date of multidisciplinary goals of care discussion [06-16-22] Code Status:  full code Disposition: ICU  2022-06-16: Will update family when they arrive at bedside  Critical care time: 20 minutes      Donell Beers, [place] Pager [phone] (please enter 7 digits) PCCM Consult Pager [phone] (please enter 7 digits)

## 2022-07-03 NOTE — Progress Notes (Signed)
  Chaplain On-Call received a call from ICU RN Margreta Journey, who reported a request from patient's daughter for a Jasmine Cummings.  Chaplain met daughter Jasmine Cummings at bedside and provided spiritual and emotional support.  Chaplain then located entries in [place] in [place] had visited the patient on Monday, December 11, facilitated by Hulan Saas will inform RN Margreta Journey, who will inform daughter upon her return from errands.  Chaplain Pollyann Samples M.Div., [facility]

## 2022-07-03 NOTE — Progress Notes (Signed)
[place] for Electrolyte Monitoring and Replacement   Recent Labs: Potassium  Date Value  2022/07/09 4.9 mmol/L  02/02/2016 4.5 mEq/L   Magnesium (mg/dL)  Date Value  06/13/2022 2.4  12/11/2013 2.3   Calcium (mg/dL)  Date Value  Jul 09, 2022 7.8 (L)  02/02/2016 9.3   Albumin (g/dL)  Date Value  2022/07/09 2.2 (L)  02/02/2016 4.0   Phosphorus (mg/dL)  Date Value  Jul 09, 2022 6.3 (H)  07-09-2022 6.2 (H)   Sodium  Date Value  09-Jul-2022 130 mmol/L (L)  02/02/2016 136 mEq/L   Corrected Calcium: 9.2 mg/dL  Assessment: 77 year old female with past medical history of kidney transplant 2013 (on immunosuppressants- tacrolimus and mycophenolate), dementia, paroxysmal atrial fibrillation, and insulin dependent T2DM. Admitted with +RSV with superimposed bacterial pneumonia ISO immunocompromised state, acute on chronic hyponatremia secondary to decreased oral intake and AKI with metabolic acidosis. Currently intubated on 50% FiO2 and sedated on propofol. Receiving pressor support with norepinephrine @ 2 mcg/min. Pharmacy has been consulted to manage electrolytes while patient is under PCCM care.  Goal of Therapy:  K > 4 Mag > 2 Na to baseline (nephrology consulted) All other electrolytes within normal limits  Plan:  No replacement warranted Continue LR '@50'$  mL/hr (per nephrology & PCCM) Follow up labs tomorrow AM   Pearla Dubonnet, PharmD Clinical Pharmacist July 09, 2022 7:24 AM

## 2022-07-03 NOTE — Progress Notes (Signed)
[place], [place] 06/17/2022  Subjective:   Hospital day # 7  Patient seen and evaluated at bedside in ICU Remains on vent 40% Sedated with Fentanyl Remains on Levo and heparin drip UOP 168m  Renal: 12/12 0701 - 12/13 0700 In: 1211.6 [I.V.:662.8; NG/GT:479.7; IV Piggyback:69.1] Out: 105 [Urine:105] Lab Results  Component Value Date   CREATININE 4.80 (H) 112/16/2023  CREATININE 4.11 (H) 06/13/2022   CREATININE 3.17 (H) 06/12/2022     Objective:  Vital signs in last 24 hours:  Temp:  [98.4 F (36.9 C)-99.1 F (37.3 C)] 98.9 F (37.2 C) (12/13 0400) Pulse Rate:  [78-101] 88 (12/13 0730) Resp:  [15-27] 27 (12/13 0730) BP: (82-96)/(51-61) 95/56 (12/13 0730) SpO2:  [90 %-97 %] 91 % (12/13 0730) FiO2 (%):  [40 %] 40 % (12/13 0713) Weight:  [60.8 kg] 60.8 kg (12/13 0500)  Weight change:  Filed Weights   06/13/2022 1206 06/11/22 0500 12023/12/[id number]  Weight: 53.1 kg 60 kg 60.8 kg    Intake/Output:    Intake/Output Summary (Last 24 hours) at 1December 16, [id number] Last data filed at [id number] Gross per 24 hour  Intake 990.4 ml  Output 95 ml  Net 895.4 ml      Physical Exam: General: Critically ill-appearing  HEENT Dry oral mucous membranes  Pulm/lungs Right coarse breath sounds, vent assisted  CVS/Heart No rub  Abdomen:  Soft, nontender  Extremities: Bilateral lower extremity edema  Neurologic: Currently intubated  Skin: No acute rashes          Basic Metabolic Panel:  Recent Labs  Lab 06/10/22 0532 06/10/22 1930 06/11/22 0420 06/11/22 0546 06/12/22 0340 06/13/22 0458 112/16/230413  NA 131* 130* 132* 129* 132* 131* 130*  K 3.7 4.0 4.0 4.1 4.1 4.6 4.9  CL 103 101 100 100 102 102 104  CO2 18* 16* 16* 17* 19* 19* 16*  GLUCOSE 132* 247* 242* 246* 150* 197* 321*  BUN 52* 64* 65* 63* 68* 73* 85*  CREATININE 1.95* 2.40* 2.34* 2.28* 3.17* 4.11* 4.80*  CALCIUM 8.2* 8.4* 8.4* 8.1* 8.2* 8.1* 7.8*  MG 2.1 2.2 2.3  --  2.3 2.4   --   PHOS  --   --   --   --   --  6.5* 6.3*  6.2*      CBC: Recent Labs  Lab 06/03/2022 1213 06/08/22 0459 06/10/22 1930 06/11/22 0420 06/11/22 0546 06/12/22 0340 06/13/22 0458 112/16/[id number]  WBC 7.3   < > 13.2* 17.0* 14.4* 17.0* 16.4* 28.2*  NEUTROABS 5.0  --  11.6*  --  12.3*  --   --   --   HGB 10.4*   < > 10.2* 10.8* 10.1* 10.2* 10.4* 9.7*  HCT 32.4*   < > 31.1* 32.7* 30.8* 31.8* 33.0* 31.4*  MCV 86.6   < > 85.0 83.8 85.3 86.9 88.7 89.2  PLT 151   < > 212 223 190 282 317 330   < > = values in this interval not displayed.      No results found for: "HEPBSAG", "HEPBSAB", "HEPBIGM"    Microbiology:  Recent Results (from the past 240 hour(s))  Resp panel by RT-PCR (RSV, Flu A&B, Covid) Anterior Nasal Swab     Status: Abnormal   Collection Time: 06/19/2022 12:13 PM   Specimen: Anterior Nasal Swab  Result Value Ref Range Status   SARS Coronavirus 2 by RT PCR NEGATIVE NEGATIVE Final    Comment: (NOTE) SARS-CoV-2 target nucleic acids are  NOT DETECTED.  The SARS-CoV-2 RNA is generally detectable in upper respiratory specimens during the acute phase of infection. The lowest concentration of SARS-CoV-2 viral copies this assay can detect is 138 copies/mL. A negative result does not preclude SARS-Cov-2 infection and should not be used as the sole basis for treatment or other patient management decisions. A negative result may occur with  improper specimen collection/handling, submission of specimen other than nasopharyngeal swab, presence of viral mutation(s) within the areas targeted by this assay, and inadequate number of viral copies(<138 copies/mL). A negative result must be combined with clinical observations, patient history, and epidemiological information. The expected result is Negative.  Fact Sheet for Patients:  EntrepreneurPulse.com.au  Fact Sheet for Healthcare Providers:  IncredibleEmployment.be  This test is no t yet  approved or cleared by the [place] FDA and  has been authorized for detection and/or diagnosis of SARS-CoV-2 by FDA under an Emergency Use Authorization (EUA). This EUA will remain  in effect (meaning this test can be used) for the duration of the COVID-19 declaration under Section 564(b)(1) of the Act, 21 U.S.C.section 360bbb-3(b)(1), unless the authorization is terminated  or revoked sooner.       Influenza A by PCR NEGATIVE NEGATIVE Final   Influenza B by PCR NEGATIVE NEGATIVE Final    Comment: (NOTE) The Xpert Xpress SARS-CoV-2/FLU/RSV plus assay is intended as an aid in the diagnosis of influenza from Nasopharyngeal swab specimens and should not be used as a sole basis for treatment. Nasal washings and aspirates are unacceptable for Xpert Xpress SARS-CoV-2/FLU/RSV testing.  Fact Sheet for Patients: EntrepreneurPulse.com.au  Fact Sheet for Healthcare Providers: IncredibleEmployment.be  This test is not yet approved or cleared by the [place] FDA and has been authorized for detection and/or diagnosis of SARS-CoV-2 by FDA under an Emergency Use Authorization (EUA). This EUA will remain in effect (meaning this test can be used) for the duration of the COVID-19 declaration under Section 564(b)(1) of the Act, 21 U.S.C. section 360bbb-3(b)(1), unless the authorization is terminated or revoked.     Resp Syncytial Virus by PCR POSITIVE (A) NEGATIVE Final    Comment: (NOTE) Fact Sheet for Patients: EntrepreneurPulse.com.au  Fact Sheet for Healthcare Providers: IncredibleEmployment.be  This test is not yet approved or cleared by the [place] FDA and has been authorized for detection and/or diagnosis of SARS-CoV-2 by FDA under an Emergency Use Authorization (EUA). This EUA will remain in effect (meaning this test can be used) for the duration of the COVID-19 declaration under Section 564(b)(1)  of the Act, 21 U.S.C. section 360bbb-3(b)(1), unless the authorization is terminated or revoked.  Performed at [facility], [address] [place], [place] [address]   Culture, blood (Routine X 2) w Reflex to ID Panel     Status: None (Preliminary result)   Collection Time: 06/10/22 11:47 PM   Specimen: BLOOD RIGHT HAND  Result Value Ref Range Status   Specimen Description BLOOD RIGHT HAND  Final   Special Requests   Final    BOTTLES DRAWN AEROBIC AND ANAEROBIC Blood Culture adequate volume   Culture   Final    NO GROWTH 4 DAYS Performed at [facility], [address], [place], [place] [address]    Report Status PENDING  Incomplete  Culture, blood (Routine X 2) w Reflex to ID Panel     Status: None (Preliminary result)   Collection Time: 06/10/22 11:47 PM   Specimen: BLOOD RIGHT ARM  Result Value Ref Range Status  Specimen Description BLOOD RIGHT ARM  Final   Special Requests   Final    BOTTLES DRAWN AEROBIC AND ANAEROBIC Blood Culture adequate volume   Culture   Final    NO GROWTH 4 DAYS Performed at [facility], [address] [place], [place] [address]    Report Status PENDING  Incomplete  MRSA Next Gen by PCR, Nasal     Status: None   Collection Time: 06/11/22  2:06 PM   Specimen: Nasal Mucosa; Nasal Swab  Result Value Ref Range Status   MRSA by PCR Next Gen NOT DETECTED NOT DETECTED Final    Comment: (NOTE) The GeneXpert MRSA Assay (FDA approved for NASAL specimens only), is one component of a comprehensive MRSA colonization surveillance program. It is not intended to diagnose MRSA infection nor to guide or monitor treatment for MRSA infections. Test performance is not FDA approved in patients less than 70 years old. Performed at [facility], [address] [place], [place] [address]   Culture, Respiratory w Gram Stain     Status: None (Preliminary result)   Collection Time: 06/12/22 11:20 AM   Specimen:  Bronchoalveolar Lavage; Respiratory  Result Value Ref Range Status   Specimen Description   Final    BRONCHIAL ALVEOLAR LAVAGE Performed at [facility], [address] [place], [place] [address]    Special Requests   Final    NONE Performed at [facility], [address] [place], [place] [address]    Gram Stain   Final    RARE WBC PRESENT,BOTH PMN AND MONONUCLEAR NO ORGANISMS SEEN    Culture   Final    CULTURE REINCUBATED FOR BETTER GROWTH Performed at [facility], [place] [address], [place], [place] [address]    Report Status PENDING  Incomplete  Acid Fast Smear (AFB)     Status: None   Collection Time: 06/12/22 11:20 AM   Specimen: Bronchoalveolar Lavage; Respiratory  Result Value Ref Range Status   AFB Specimen Processing Concentration  Final   Acid Fast Smear Negative  Final    Comment: (NOTE) Performed At: [facility] [place], [place] [id number] Rush Farmer MD ON:[id number]    Source (AFB) BRONCHIAL ALVEOLAR LAVAGE  Final    Comment: Performed at [facility], [address] [place], [place] [address]    Coagulation Studies: Recent Labs    06/13/22 1635  LABPROT 18.4*  INR 1.5*    Urinalysis: No results for input(s): "COLORURINE", "LABSPEC", "PHURINE", "GLUCOSEU", "HGBUR", "BILIRUBINUR", "KETONESUR", "PROTEINUR", "UROBILINOGEN", "NITRITE", "LEUKOCYTESUR" in the last 72 hours.  Invalid input(s): "APPERANCEUR"     Imaging: DG Chest Port 1 View  Result Date: Jun 21, 2022 CLINICAL DATA:  Acute respiratory failure with hypoxia EXAM: PORTABLE CHEST - 1 VIEW COMPARISON:  06/12/2022 FINDINGS: Endotracheal tube tip approximately 3.5 cm above carina. Nasogastric tube extends at least as far stomach, tip not seen. Left IJ central venous catheter to the distal SVC. Perihilar and bibasilar interstitial airspace opacities or edema with appear marginally improved. Probable bilateral pleural effusions. Heart  size upper limits normal. Aortic Atherosclerosis (ICD10-170.0). Visualized bones unremarkable. IMPRESSION: 1. Marginal improvement in perihilar and bibasilar edema/infiltrates. 2. Support hardware in expected position as above. Electronically Signed   By: Lucrezia Europe M.D.   On: 06/21/22 06:37   ECHOCARDIOGRAM COMPLETE  Result Date: 06/13/2022    ECHOCARDIOGRAM REPORT   Patient Name:   LOREE SHEHATA Date of Exam: 06/13/2022 Medical Rec #:  [id number]  Height:       67.0 in Accession #:    [id number]      Weight:       132.3 lb Date of Birth:  29-Oct-1945      BSA:          1.696 m Patient Age:    1 years        BP:           103/60 mmHg Patient Gender: F               HR:           84 bpm. Exam Location:  [facility] Procedure: 2D Echo, Cardiac Doppler and Color Doppler Indications:     Shock  History:         Patient has prior history of Echocardiogram examinations, most                  recent 08/09/2021. Risk Factors:Hypertension. CKD.  Sonographer:     Sherrie Sport Referring Phys:  [id number] Bradly Bienenstock Diagnosing Phys: Nelva Bush MD  Sonographer Comments: Echo performed with patient supine and on artificial respirator. Image quality was good. IMPRESSIONS  1. Left ventricular ejection fraction, by estimation, is 25 to 30%. The left ventricle has severely decreased function. The left ventricle demonstrates regional wall motion abnormalities (see scoring diagram/findings for description). There is mild asymmetric left ventricular hypertrophy of the basal-septal segment. Left ventricular diastolic parameters are consistent with Grade II diastolic dysfunction (pseudonormalization). Elevated left atrial pressure. There is severe hypokinesis of the left ventricular, mid-apical anteroseptal wall, anterior wall, anterolateral wall and apical segment.  2. Right ventricular systolic function is normal. The right ventricular size is normal.  3. Moderate pleural effusion in the left lateral region.  4. The mitral  valve is degenerative. Moderate mitral valve regurgitation. No evidence of mitral stenosis.  5. Tricuspid valve regurgitation is moderate to severe.  6. The aortic valve has an indeterminant number of cusps. There is severe calcifcation of the aortic valve. There is severe thickening of the aortic valve. Aortic valve regurgitation is not visualized. Moderate to severe aortic valve stenosis. Aortic valve area, by VTI measures 0.79 cm. Aortic valve mean gradient measures 21.0 mmHg. FINDINGS  Left Ventricle: Left ventricular ejection fraction, by estimation, is 25 to 30%. The left ventricle has severely decreased function. The left ventricle demonstrates regional wall motion abnormalities. Severe hypokinesis of the left ventricular, mid-apical anteroseptal wall, anterior wall, anterolateral wall and apical segment. The left ventricular internal cavity size was normal in size. There is mild asymmetric left ventricular hypertrophy of the basal-septal segment. Left ventricular diastolic parameters are consistent with Grade II diastolic dysfunction (pseudonormalization). Elevated left atrial pressure. Right Ventricle: The right ventricular size is normal. No increase in right ventricular wall thickness. Right ventricular systolic function is normal. Left Atrium: Left atrial size was normal in size. Right Atrium: Right atrial size was normal in size. Pericardium: There is no evidence of pericardial effusion. Mitral Valve: The mitral valve is degenerative in appearance. There is mild thickening of the mitral valve leaflet(s). Mild mitral annular calcification. Moderate mitral valve regurgitation. No evidence of mitral valve stenosis. Tricuspid Valve: The tricuspid valve is not well visualized. Tricuspid valve regurgitation is moderate to severe. Aortic Valve: The aortic valve has an indeterminant number of cusps. There is severe calcifcation of the aortic valve. There is severe thickening of the aortic valve. Aortic valve  regurgitation is not visualized. Moderate to severe aortic stenosis  is present. Aortic valve mean gradient measures 21.0 mmHg. Aortic valve peak gradient measures 31.7 mmHg. Aortic valve area, by VTI measures 0.79 cm. Pulmonic Valve: The pulmonic valve was not well visualized. Pulmonic valve regurgitation is trivial. No evidence of pulmonic stenosis. Aorta: The aortic root is normal in size and structure. Venous: IVC assessment for right atrial pressure unable to be performed due to mechanical ventilation. IAS/Shunts: No atrial level shunt detected by color flow Doppler. Additional Comments: There is a moderate pleural effusion in the left lateral region.  LEFT VENTRICLE PLAX 2D LVIDd:         4.30 cm      Diastology LVIDs:         3.10 cm      LV e' medial:    4.79 cm/s LV PW:         0.70 cm      LV E/e' medial:  29.2 LV IVS:        1.10 cm      LV e' lateral:   10.20 cm/s LVOT diam:     2.00 cm      LV E/e' lateral: 13.7 LV SV:         42 LV SV Index:   25 LVOT Area:     3.14 cm  LV Volumes (MOD) LV vol d, MOD A2C: 126.0 ml LV vol d, MOD A4C: 67.8 ml LV vol s, MOD A2C: 102.0 ml LV vol s, MOD A4C: 50.6 ml LV SV MOD A2C:     24.0 ml LV SV MOD A4C:     67.8 ml LV SV MOD BP:      19.0 ml RIGHT VENTRICLE RV Basal diam:  3.40 cm RV Mid diam:    3.20 cm RV S prime:     11.30 cm/s TAPSE (M-mode): 2.4 cm LEFT ATRIUM             Index        RIGHT ATRIUM           Index LA diam:        4.50 cm 2.65 cm/m   RA Area:     11.80 cm LA Vol (A2C):   26.2 ml 15.44 ml/m  RA Volume:   29.40 ml  17.33 ml/m LA Vol (A4C):   43.0 ml 25.35 ml/m LA Biplane Vol: 36.1 ml 21.28 ml/m  AORTIC VALVE AV Area (Vmax):    0.78 cm AV Area (Vmean):   0.74 cm AV Area (VTI):     0.79 cm AV Vmax:           281.67 cm/s AV Vmean:          206.000 cm/s AV VTI:            0.526 m AV Peak Grad:      31.7 mmHg AV Mean Grad:      21.0 mmHg LVOT Vmax:         70.30 cm/s LVOT Vmean:        48.500 cm/s LVOT VTI:          0.133 m LVOT/AV VTI ratio: 0.25   AORTA Ao Root diam: 3.30 cm MITRAL VALVE                TRICUSPID VALVE MV Area (PHT): 4.06 cm     TR Peak grad:   45.4 mmHg MV Decel Time: 187 msec     TR Vmax:        337.00  cm/s MV E velocity: 140.00 cm/s MV A velocity: 76.00 cm/s   SHUNTS MV E/A ratio:  1.84         Systemic VTI:  0.13 m                             Systemic Diam: 2.00 cm Nelva Bush MD Electronically signed by Nelva Bush MD Signature Date/Time: 06/13/2022/12:57:00 PM    Final (Updated)    DG Chest Port 1 View  Result Date: 06/12/2022 CLINICAL DATA:  [id number] Encounter for central line placement [id number] EXAM: PORTABLE CHEST 1 VIEW COMPARISON:  Chest x-ray 05/12/2022, CT chest 06/03/2022 FINDINGS: Endotracheal tube terminates 2 cm above the carina. Enteric tube courses below the hemidiaphragm with tip and side port collimated off view. Left internal jugular central venous catheter with tip at the superior cavoatrial junction. The heart and mediastinal contours are unchanged. Aortic calcification. Bilateral, mid to lower lung zone predominant, patchy airspace opacities. No pulmonary edema. Bilateral at least trace to small volume pleural effusions. No pneumothorax. No acute osseous abnormality. IMPRESSION: 1. Bilateral, mid to lower lung zone predominant, patchy airspace opacities. 2. Bilateral at least trace to small volume pleural effusions. 3. Lines and tubes as above. 4.  Aortic Atherosclerosis (ICD10-I70.0). Electronically Signed   By: Iven Finn M.D.   On: 06/12/2022 18:20     Medications:    sodium chloride     ceFEPime (MAXIPIME) IV 200 mL/hr at July 12, 2022 0600   feeding supplement (VITAL AF 1.2 CAL) 20 mL/hr at 07/12/2022 0600   fentaNYL infusion INTRAVENOUS 100 mcg/hr (07/12/2022 0600)   heparin 900 Units/hr (July 12, 2022 5456)   lactated ringers Stopped (06/12/22 1054)   norepinephrine (LEVOPHED) Adult infusion 15 mcg/min (2022-07-12 1040)   promethazine (PHENERGAN) injection (IM or IVPB) Stopped (06/10/22 1214)    propofol (DIPRIVAN) infusion Stopped (06/11/22 1505)    albuterol  2.5 mg Nebulization BID   aspirin  81 mg Per Tube Daily   atorvastatin  20 mg Oral Daily   brimonidine  1 drop Right Eye BID   Chlorhexidine Gluconate Cloth  6 each Topical Daily   docusate  100 mg Per Tube BID   FLUoxetine  40 mg Oral q morning   free water  30 mL Per Tube Q4H   insulin aspart  0-15 Units Subcutaneous Q4H   insulin glargine-yfgn  9 Units Subcutaneous QHS   multivitamin  1 tablet Per Tube QHS   mycophenolate  180 mg Oral QHS   mycophenolate  360 mg Oral Daily   mouth rinse  15 mL Mouth Rinse Q2H   pantoprazole (PROTONIX) IV  40 mg Intravenous Q24H   polyethylene glycol  17 g Per Tube Daily   tacrolimus  1 mg Oral QHS   tacrolimus  2 mg Oral Daily   vancomycin variable dose per unstable renal function (pharmacist dosing)   Does not apply See admin instructions   acetaminophen **OR** acetaminophen, albuterol, guaiFENesin-dextromethorphan, hydrOXYzine, midazolam, ondansetron **OR** ondansetron (ZOFRAN) IV, mouth rinse, promethazine (PHENERGAN) injection (IM or IVPB)  Assessment/ Plan:  77 y.o. female with  diabetes type 2, paroxysmal atrial fibrillation, mild cognitive deficits and kidney transplant in 2013, who was  admitted on 06/23/2022 for Hypoxia [R09.02] RSV infection [B33.8] Pneumonia due to respiratory syncytial virus (RSV) [J12.1] Community acquired pneumonia, unspecified laterality [J18.9]  1. 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  Renal transplant status.  Patient states baseline creatinine 1.2.  Currently followed  by [facility] in [place].  Patient has received deceased donor renal transplant in 2013 at [facility].  Kidney failure related to diabetes.  Continue home immunosuppressive regimen which includes, mycophenolate 360 mg in the morning and 180 mg at night and tacrolimus 2 mg in the morning and 1 mg at night.   -Renal function continues  to decline and patient remains anuric. We will continue to monitor her chart but will not visit daily.  Lab Results  Component Value Date   CREATININE 4.80 (H) 06/29/2022   CREATININE 4.11 (H) 06/13/2022   CREATININE 3.17 (H) 06/12/2022   12/12 0701 - 12/13 0700 In: 1211.6 [I.V.:662.8; NG/GT:479.7; IV Piggyback:69.1] Out: 105 [Urine:105]    2.  Hyponatremia, likely due to poor oral intake.  Sodium 130 today.   3.  Acute metabolic acidosis.  Likely secondary to acute kidney injury.  Serum bicarbonate now 16. Received 1 amp sodium bicarb IV.    4.  Acute respiratory failure/pneumonia due to RSV.  IV antibiotics managed by primary team, currently on cefepime.  Remains intubated.   LOS: [place] 06-29-2310:44 [place], [place]

## 2022-07-03 NOTE — Death Summary Note (Signed)
DEATH SUMMARY   Patient Details  Name: Jasmine Cummings MRN: [id number] DOB: 1945-08-02  Admission/Discharge Information   Admit Date:  Jun 24, 2022  Date of Death:  2022-07-01  Time of Death:  Aug 29, 1808  Length of Stay: 7  Referring Physician: Reynold Bowen, MD   Reason(s) for Hospitalization  Body aches and non productive cough   Diagnoses  Preliminary cause of death: Pneumonia due to respiratory syncytial virus (RSV) Secondary Diagnoses (including complications and co-morbidities):  Principal Problem:   Pneumonia due to respiratory syncytial virus (RSV) Active Problems:   Acute kidney injury superimposed on chronic kidney disease ([place])   Deceased-donor kidney transplant recipient   Essential hypertension   Hyponatremia   Immunosuppression ([place])   PAF (paroxysmal atrial fibrillation) ([place])   Uncontrolled type 2 diabetes mellitus with hyperglycemia, with long-term current use of insulin (HCC)   Acute respiratory failure with hypoxia (HCC)   Metabolic acidosis   AKI (acute kidney injury) ([place])   Dementia without behavioral disturbance (HCC)   HFrEF (heart failure with reduced ejection fraction) (HCC)   Non-ST elevation (NSTEMI) myocardial infarction ([place])   Severe sepsis with septic shock   Brief Hospital Course (including significant findings, care, treatment, and services provided and events leading to death)  Jasmine Cummings is a 77 y.o. year old admitted with severe sepsis with septic shock and acute hypoxic respiratory failure in the setting of RSV pneumonia & suspected superimposed aspiration/bacterial pneumonia in immunocompromised patient requiring intubation and mechanical ventilation. Also with AKI superimposed on CKD Stage III (hx of renal transplant). Detailed hospital course below under significant events.   Significant Hospital Events   06-25-2023: Admitted to [facility] unit with acute hypoxic respiratory failure secondary to RSV pneumonia 12/8: Nephrology consulted due to  ESRD and history of deceased donor kidney transplant 12/8: Rapid response called for respiratory distress with tachypnea, use of assessor muscle and hypoxia 74% on 4 L.  Patient placed on BiPAP and transferred to the ICU. 12/9: Remained on BiPAP, however with worsening respiratory distress, increased work of breathing and JVD, BiPAP setting adjusted gradually with prior to up to 100% with sats still in the 80s.  Due to worsening respiratory failure and now patient altered with increased work of breathing patient was emergently intubated for airway protection. 12/11: Worsening AKI with Oliguria. Nephrology following. Worsening Leukocytosis, Bronchoscopy performed. Code status changed to DNR/DNI per daughter's request. 12/12: Remains on pressors (15 mcg Levo), vent settings decreased to 40% fio2 & 5 PEEP.  Worsening AKI with oligruia, pt would not wish to pursue HD.  Echo with severe decrease in LVEF to 25-30%, moderate to severe AS.  Cardiology consulted, checking EKG, troponin, and Coox panel. Consult Palliative Care. 02-Jul-2023: Pt remains mechanically intubated vent settings: PEEP 5/FiO2 40%.  Levophed gtt _0  mcg/min. Pt with worsening renal failure and remains oliguric.  Pts family decided not to proceed with any form of dialysis honoring pts previous wishes.  Pt transitioned to Comfort Measures Only per family decision.  Pt expired on 07/01/22 at 30 with daughter present at bedside.  Pertinent Labs and Studies  Significant Diagnostic Studies DG Chest Port 1 View  Result Date: 07/01/2022 CLINICAL DATA:  Acute respiratory failure with hypoxia EXAM: PORTABLE CHEST - 1 VIEW COMPARISON:  06/12/2022 FINDINGS: Endotracheal tube tip approximately 3.5 cm above carina. Nasogastric tube extends at least as far stomach, tip not seen. Left IJ central venous catheter to the distal SVC. Perihilar and bibasilar interstitial airspace opacities or edema with appear marginally improved.  Probable bilateral pleural  effusions. Heart size upper limits normal. Aortic Atherosclerosis (ICD10-170.0). Visualized bones unremarkable. IMPRESSION: 1. Marginal improvement in perihilar and bibasilar edema/infiltrates. 2. Support hardware in expected position as above. Electronically Signed   By: Lucrezia Europe M.D.   On: July 10, 2022 06:37   ECHOCARDIOGRAM COMPLETE  Result Date: 06/13/2022    ECHOCARDIOGRAM REPORT   Patient Name:   Jasmine Cummings Date of Exam: 06/13/2022 Medical Rec #:  [id number]       Height:       67.0 in Accession #:    [id number]      Weight:       132.3 lb Date of Birth:  12/13/1945      BSA:          1.696 m Patient Age:    32 years        BP:           103/60 mmHg Patient Gender: F               HR:           84 bpm. Exam Location:  [facility] Procedure: 2D Echo, Cardiac Doppler and Color Doppler Indications:     Shock  History:         Patient has prior history of Echocardiogram examinations, most                  recent 08/09/2021. Risk Factors:Hypertension. CKD.  Sonographer:     Sherrie Sport Referring Phys:  [id number] Bradly Bienenstock Diagnosing Phys: Nelva Bush MD  Sonographer Comments: Echo performed with patient supine and on artificial respirator. Image quality was good. IMPRESSIONS  1. Left ventricular ejection fraction, by estimation, is 25 to 30%. The left ventricle has severely decreased function. The left ventricle demonstrates regional wall motion abnormalities (see scoring diagram/findings for description). There is mild asymmetric left ventricular hypertrophy of the basal-septal segment. Left ventricular diastolic parameters are consistent with Grade II diastolic dysfunction (pseudonormalization). Elevated left atrial pressure. There is severe hypokinesis of the left ventricular, mid-apical anteroseptal wall, anterior wall, anterolateral wall and apical segment.  2. Right ventricular systolic function is normal. The right ventricular size is normal.  3. Moderate pleural effusion in the left lateral region.   4. The mitral valve is degenerative. Moderate mitral valve regurgitation. No evidence of mitral stenosis.  5. Tricuspid valve regurgitation is moderate to severe.  6. The aortic valve has an indeterminant number of cusps. There is severe calcifcation of the aortic valve. There is severe thickening of the aortic valve. Aortic valve regurgitation is not visualized. Moderate to severe aortic valve stenosis. Aortic valve area, by VTI measures 0.79 cm. Aortic valve mean gradient measures 21.0 mmHg. FINDINGS  Left Ventricle: Left ventricular ejection fraction, by estimation, is 25 to 30%. The left ventricle has severely decreased function. The left ventricle demonstrates regional wall motion abnormalities. Severe hypokinesis of the left ventricular, mid-apical anteroseptal wall, anterior wall, anterolateral wall and apical segment. The left ventricular internal cavity size was normal in size. There is mild asymmetric left ventricular hypertrophy of the basal-septal segment. Left ventricular diastolic parameters are consistent with Grade II diastolic dysfunction (pseudonormalization). Elevated left atrial pressure. Right Ventricle: The right ventricular size is normal. No increase in right ventricular wall thickness. Right ventricular systolic function is normal. Left Atrium: Left atrial size was normal in size. Right Atrium: Right atrial size was normal in size. Pericardium: There is no evidence of pericardial effusion. Mitral Valve: The  mitral valve is degenerative in appearance. There is mild thickening of the mitral valve leaflet(s). Mild mitral annular calcification. Moderate mitral valve regurgitation. No evidence of mitral valve stenosis. Tricuspid Valve: The tricuspid valve is not well visualized. Tricuspid valve regurgitation is moderate to severe. Aortic Valve: The aortic valve has an indeterminant number of cusps. There is severe calcifcation of the aortic valve. There is severe thickening of the aortic valve.  Aortic valve regurgitation is not visualized. Moderate to severe aortic stenosis is present. Aortic valve mean gradient measures 21.0 mmHg. Aortic valve peak gradient measures 31.7 mmHg. Aortic valve area, by VTI measures 0.79 cm. Pulmonic Valve: The pulmonic valve was not well visualized. Pulmonic valve regurgitation is trivial. No evidence of pulmonic stenosis. Aorta: The aortic root is normal in size and structure. Venous: IVC assessment for right atrial pressure unable to be performed due to mechanical ventilation. IAS/Shunts: No atrial level shunt detected by color flow Doppler. Additional Comments: There is a moderate pleural effusion in the left lateral region.  LEFT VENTRICLE PLAX 2D LVIDd:         4.30 cm      Diastology LVIDs:         3.10 cm      LV e' medial:    4.79 cm/s LV PW:         0.70 cm      LV E/e' medial:  29.2 LV IVS:        1.10 cm      LV e' lateral:   10.20 cm/s LVOT diam:     2.00 cm      LV E/e' lateral: 13.7 LV SV:         42 LV SV Index:   25 LVOT Area:     3.14 cm  LV Volumes (MOD) LV vol d, MOD A2C: 126.0 ml LV vol d, MOD A4C: 67.8 ml LV vol s, MOD A2C: 102.0 ml LV vol s, MOD A4C: 50.6 ml LV SV MOD A2C:     24.0 ml LV SV MOD A4C:     67.8 ml LV SV MOD BP:      19.0 ml RIGHT VENTRICLE RV Basal diam:  3.40 cm RV Mid diam:    3.20 cm RV S prime:     11.30 cm/s TAPSE (M-mode): 2.4 cm LEFT ATRIUM             Index        RIGHT ATRIUM           Index LA diam:        4.50 cm 2.65 cm/m   RA Area:     11.80 cm LA Vol (A2C):   26.2 ml 15.44 ml/m  RA Volume:   29.40 ml  17.33 ml/m LA Vol (A4C):   43.0 ml 25.35 ml/m LA Biplane Vol: 36.1 ml 21.28 ml/m  AORTIC VALVE AV Area (Vmax):    0.78 cm AV Area (Vmean):   0.74 cm AV Area (VTI):     0.79 cm AV Vmax:           281.67 cm/s AV Vmean:          206.000 cm/s AV VTI:            0.526 m AV Peak Grad:      31.7 mmHg AV Mean Grad:      21.0 mmHg LVOT Vmax:         70.30 cm/s LVOT Vmean:  48.500 cm/s LVOT VTI:          0.133 m LVOT/AV  VTI ratio: 0.25  AORTA Ao Root diam: 3.30 cm MITRAL VALVE                TRICUSPID VALVE MV Area (PHT): 4.06 cm     TR Peak grad:   45.4 mmHg MV Decel Time: 187 msec     TR Vmax:        337.00 cm/s MV E velocity: 140.00 cm/s MV A velocity: 76.00 cm/s   SHUNTS MV E/A ratio:  1.84         Systemic VTI:  0.13 m                             Systemic Diam: 2.00 cm Nelva Bush MD Electronically signed by Nelva Bush MD Signature Date/Time: 06/13/2022/12:57:00 PM    Final (Updated)    DG Chest Port 1 View  Result Date: 06/12/2022 CLINICAL DATA:  [id number] Encounter for central line placement [id number] EXAM: PORTABLE CHEST 1 VIEW COMPARISON:  Chest x-ray 05/12/2022, CT chest 06/24/2022 FINDINGS: Endotracheal tube terminates 2 cm above the carina. Enteric tube courses below the hemidiaphragm with tip and side port collimated off view. Left internal jugular central venous catheter with tip at the superior cavoatrial junction. The heart and mediastinal contours are unchanged. Aortic calcification. Bilateral, mid to lower lung zone predominant, patchy airspace opacities. No pulmonary edema. Bilateral at least trace to small volume pleural effusions. No pneumothorax. No acute osseous abnormality. IMPRESSION: 1. Bilateral, mid to lower lung zone predominant, patchy airspace opacities. 2. Bilateral at least trace to small volume pleural effusions. 3. Lines and tubes as above. 4.  Aortic Atherosclerosis (ICD10-I70.0). Electronically Signed   By: Iven Finn M.D.   On: 06/12/2022 18:20   DG Abd 1 View  Result Date: 06/11/2022 CLINICAL DATA:  Nasogastric tube placement EXAM: ABDOMEN - 1 VIEW COMPARISON:  None Available. FINDINGS: Nasogastric tube tip overlies expected mid body of the stomach. The abdominal gas pattern is indeterminate due to a paucity of intra-abdominal gas. Small bilateral pleural effusions are present. Interstitial pulmonary infiltrates are seen at the right lung base. Vascular calcifications noted  within the abdomen. IMPRESSION: 1. Nasogastric tube tip within the mid body of the stomach. Electronically Signed   By: Fidela Salisbury M.D.   On: 06/11/2022 03:18   DG Chest 1 View  Result Date: 06/11/2022 CLINICAL DATA:  Respiratory failure, intubation EXAM: CHEST  1 VIEW COMPARISON:  7:55 p.m. FINDINGS: Endotracheal tube seen 2 cm above the carina. The lungs are symmetrically well expanded and pulmonary insufflation is preserved. Small bilateral pleural effusions are present. Multifocal interstitial airspace infiltrates are again seen, more focal within the right perihilar region, stable since prior examination. No pneumothorax. Cardiac size within normal limits. No acute bone abnormality. IMPRESSION: 1. Endotracheal tube 2 cm above the carina. 2. Stable multifocal pulmonary infiltrate. 3. Small bilateral pleural effusions. Electronically Signed   By: Fidela Salisbury M.D.   On: 06/11/2022 03:17   DG Chest Port 1 View  Result Date: 06/10/2022 CLINICAL DATA:  Hypoxia. EXAM: PORTABLE CHEST 1 VIEW COMPARISON:  Radiograph and CT 06/22/2022 FINDINGS: Significant progression in bilateral airspace disease, more so on the right. Enlarging bilateral pleural effusions. Associated bibasilar consolidations likely represent compressive atelectasis. Suspected septal thickening can be seen with pulmonary edema. Heart is prominent in size. No pneumothorax. IMPRESSION: 1. Significant progression in bilateral  airspace disease, more so on the right. There also septal thickening, findings are suspicious for pulmonary edema, although multifocal pneumonia is also considered. 2. Enlarging bilateral pleural effusions. Bibasilar consolidations likely compressive atelectasis related to pleural effusions. Electronically Signed   By: Keith Rake M.D.   On: 06/10/2022 20:02   CT CHEST ABDOMEN PELVIS W CONTRAST  Result Date: 07/01/2022 CLINICAL DATA:  Nausea, weight loss for several weeks, cough, chills, abnormal chest x-ray  EXAM: CT CHEST, ABDOMEN, AND PELVIS WITH CONTRAST TECHNIQUE: Multidetector CT imaging of the chest, abdomen and pelvis was performed following the standard protocol during bolus administration of intravenous contrast. RADIATION DOSE REDUCTION: This exam was performed according to the departmental dose-optimization program which includes automated exposure control, adjustment of the mA and/or kV according to patient size and/or use of iterative reconstruction technique. CONTRAST:  60m OMNIPAQUE IOHEXOL 300 MG/ML  SOLN COMPARISON:  12/10/2013, 06/24/2022 FINDINGS: CT CHEST FINDINGS Cardiovascular: There is dense calcification of the aortic and mitral valves. Otherwise the heart is unremarkable without pericardial effusion. No evidence of thoracic aortic aneurysm or dissection. Atherosclerosis of the aorta and coronary vasculature. Mediastinum/Nodes: No enlarged mediastinal, hilar, or axillary lymph nodes. Thyroid gland, trachea, and esophagus demonstrate no significant findings. Lungs/Pleura: There are small bilateral free-flowing pleural effusions. There is patchy consolidation within the dependent bilateral lower lobes, which could reflect pneumonia or hypoventilatory change. Bronchiectasis and scarring within the right middle lobe and lingular segment left upper lobe. No pneumothorax. The central airways are patent. There is patchy tree in bud nodular airspace disease within the right upper lobe, likely inflammatory or infectious. This nodular consolidation measures 3 mm image 49/4 and 4 mm image 74/4. Musculoskeletal: No acute or destructive bony lesions. Reconstructed images demonstrate no additional findings. CT ABDOMEN PELVIS FINDINGS Hepatobiliary: No focal liver abnormality is seen. No gallstones, gallbladder wall thickening, or biliary dilatation. Pancreas: Unremarkable. No pancreatic ductal dilatation or surrounding inflammatory changes. Spleen: Normal in size without focal abnormality. Adrenals/Urinary  Tract: Marked atrophy of the native kidneys. Right lower quadrant transplant kidney is identified, with nonobstructing calculi measuring up to 7 mm. No hydronephrosis or renal mass. The bladder is unremarkable.  The adrenals are normal. Stomach/Bowel: No bowel obstruction or ileus. The appendix is surgically absent. No bowel wall thickening or inflammatory change. Vascular/Lymphatic: Aortic atherosclerosis. No enlarged abdominal or pelvic lymph nodes. Reproductive: Uterus and bilateral adnexa are unremarkable. Other: No free fluid or free intraperitoneal gas. No abdominal wall hernia. Musculoskeletal: There are no acute or destructive bony lesions. Reconstructed images demonstrate no additional findings. IMPRESSION: 1. Small bilateral pleural effusions. 2. Patchy areas of airspace disease as above, most pronounced within the lower lobes and right upper lobe. Favor multifocal pneumonia given clinical presentation. Continued follow up after appropriate medical management is recommended to document resolution. 3. Right lower quadrant transplant kidney, with nonobstructing renal calculi measuring up to 7 mm. 4. Calcification of the aortic and mitral valves. 5. Aortic Atherosclerosis (ICD10-I70.0). Coronary artery atherosclerosis. Electronically Signed   By: MRanda NgoM.D.   On: 06/22/2022 18:19   DG Chest 2 View  Result Date: 06/04/2022 CLINICAL DATA:  Cough and chills EXAM: CHEST - 2 VIEW COMPARISON:  None Available. FINDINGS: Cardiac and mediastinal contours are within normal limits. Calcified mediastinal lymph nodes, likely sequela prior granulomatous infection. Lower lung predominant interstitial thickening and nodularity. Small bilateral pleural effusions. No evidence of pneumothorax IMPRESSION: 1. Lower lung predominant interstitial thickening and nodularity, possibly due to chronic atypical infection, although lymphangitic spread of  neoplasm could also have this appearance. Chest CT is recommended for  better characterization. 2. Small bilateral pleural effusions. Electronically Signed   By: Yetta Glassman M.D.   On: 06/12/2022 13:15    Microbiology Recent Results (from the past 240 hour(s))  Resp panel by RT-PCR (RSV, Flu A&B, Covid) Anterior Nasal Swab     Status: Abnormal   Collection Time: 06/04/2022 12:13 PM   Specimen: Anterior Nasal Swab  Result Value Ref Range Status   SARS Coronavirus 2 by RT PCR NEGATIVE NEGATIVE Final    Comment: (NOTE) SARS-CoV-2 target nucleic acids are NOT DETECTED.  The SARS-CoV-2 RNA is generally detectable in upper respiratory specimens during the acute phase of infection. The lowest concentration of SARS-CoV-2 viral copies this assay can detect is 138 copies/mL. A negative result does not preclude SARS-Cov-2 infection and should not be used as the sole basis for treatment or other patient management decisions. A negative result may occur with  improper specimen collection/handling, submission of specimen other than nasopharyngeal swab, presence of viral mutation(s) within the areas targeted by this assay, and inadequate number of viral copies(<138 copies/mL). A negative result must be combined with clinical observations, patient history, and epidemiological information. The expected result is Negative.  Fact Sheet for Patients:  EntrepreneurPulse.com.au  Fact Sheet for Healthcare Providers:  IncredibleEmployment.be  This test is no t yet approved or cleared by the [place] FDA and  has been authorized for detection and/or diagnosis of SARS-CoV-2 by FDA under an Emergency Use Authorization (EUA). This EUA will remain  in effect (meaning this test can be used) for the duration of the COVID-19 declaration under Section 564(b)(1) of the Act, 21 U.S.C.section 360bbb-3(b)(1), unless the authorization is terminated  or revoked sooner.       Influenza A by PCR NEGATIVE NEGATIVE Final   Influenza B by PCR  NEGATIVE NEGATIVE Final    Comment: (NOTE) The Xpert Xpress SARS-CoV-2/FLU/RSV plus assay is intended as an aid in the diagnosis of influenza from Nasopharyngeal swab specimens and should not be used as a sole basis for treatment. Nasal washings and aspirates are unacceptable for Xpert Xpress SARS-CoV-2/FLU/RSV testing.  Fact Sheet for Patients: EntrepreneurPulse.com.au  Fact Sheet for Healthcare Providers: IncredibleEmployment.be  This test is not yet approved or cleared by the [place] FDA and has been authorized for detection and/or diagnosis of SARS-CoV-2 by FDA under an Emergency Use Authorization (EUA). This EUA will remain in effect (meaning this test can be used) for the duration of the COVID-19 declaration under Section 564(b)(1) of the Act, 21 U.S.C. section 360bbb-3(b)(1), unless the authorization is terminated or revoked.     Resp Syncytial Virus by PCR POSITIVE (A) NEGATIVE Final    Comment: (NOTE) Fact Sheet for Patients: EntrepreneurPulse.com.au  Fact Sheet for Healthcare Providers: IncredibleEmployment.be  This test is not yet approved or cleared by the [place] FDA and has been authorized for detection and/or diagnosis of SARS-CoV-2 by FDA under an Emergency Use Authorization (EUA). This EUA will remain in effect (meaning this test can be used) for the duration of the COVID-19 declaration under Section 564(b)(1) of the Act, 21 U.S.C. section 360bbb-3(b)(1), unless the authorization is terminated or revoked.  Performed at [facility], [address] [place], [place] [address]   Culture, blood (Routine X 2) w Reflex to ID Panel     Status: None (Preliminary result)   Collection Time: 06/10/22 11:47 PM   Specimen: BLOOD RIGHT HAND  Result Value Ref Range  Status   Specimen Description BLOOD RIGHT HAND  Final   Special Requests   Final    BOTTLES DRAWN AEROBIC AND  ANAEROBIC Blood Culture adequate volume   Culture   Final    NO GROWTH 4 DAYS Performed at [facility], [address], [place], [place] [address]    Report Status PENDING  Incomplete  Culture, blood (Routine X 2) w Reflex to ID Panel     Status: None (Preliminary result)   Collection Time: 06/10/22 11:47 PM   Specimen: BLOOD RIGHT ARM  Result Value Ref Range Status   Specimen Description BLOOD RIGHT ARM  Final   Special Requests   Final    BOTTLES DRAWN AEROBIC AND ANAEROBIC Blood Culture adequate volume   Culture   Final    NO GROWTH 4 DAYS Performed at [facility], [address] [place], [place] [address]    Report Status PENDING  Incomplete  MRSA Next Gen by PCR, Nasal     Status: None   Collection Time: 06/11/22  2:06 PM   Specimen: Nasal Mucosa; Nasal Swab  Result Value Ref Range Status   MRSA by PCR Next Gen NOT DETECTED NOT DETECTED Final    Comment: (NOTE) The GeneXpert MRSA Assay (FDA approved for NASAL specimens only), is one component of a comprehensive MRSA colonization surveillance program. It is not intended to diagnose MRSA infection nor to guide or monitor treatment for MRSA infections. Test performance is not FDA approved in patients less than 24 years old. Performed at [facility], [address] [place], [place] [address]   Culture, Respiratory w Gram Stain     Status: None (Preliminary result)   Collection Time: 06/12/22 11:20 AM   Specimen: Bronchoalveolar Lavage; Respiratory  Result Value Ref Range Status   Specimen Description   Final    BRONCHIAL ALVEOLAR LAVAGE Performed at [facility], [address] [place], [place] [address]    Special Requests   Final    NONE Performed at [facility], [address] [place], [place] [address]    Gram Stain   Final    RARE WBC PRESENT,BOTH PMN AND MONONUCLEAR NO ORGANISMS SEEN    Culture   Final    CULTURE REINCUBATED FOR BETTER GROWTH Performed  at [facility], [place] [address], [place], [place] [address]    Report Status PENDING  Incomplete  Acid Fast Smear (AFB)     Status: None   Collection Time: 06/12/22 11:20 AM   Specimen: Bronchoalveolar Lavage; Respiratory  Result Value Ref Range Status   AFB Specimen Processing Concentration  Final   Acid Fast Smear Negative  Final    Comment: (NOTE) Performed At: [facility] [place], [place] [id number] Rush Farmer MD YW:[id number]    Source (AFB) BRONCHIAL ALVEOLAR LAVAGE  Final    Comment: Performed at [facility], [address] [place], [place] [address]    Lab Basic Metabolic Panel: Recent Labs  Lab 06/10/22 0532 06/10/22 1930 06/11/22 0420 06/11/22 0546 06/12/22 0340 06/13/22 0458 June 16, 2022 0413  NA 131* 130* 132* 129* 132* 131* 130*  K 3.7 4.0 4.0 4.1 4.1 4.6 4.9  CL 103 101 100 100 102 102 104  CO2 18* 16* 16* 17* 19* 19* 16*  GLUCOSE 132* 247* 242* 246* 150* 197* 321*  BUN 52* 64* 65* 63* 68* 73* 85*  CREATININE 1.95* 2.40* 2.34* 2.28* 3.17* 4.11* 4.80*  CALCIUM 8.2* 8.4* 8.4*  8.1* 8.2* 8.1* 7.8*  MG 2.1 2.2 2.3  --  2.3 2.4  --   PHOS  --   --   --   --   --  6.5* 6.3*  6.2*   Liver Function Tests: Recent Labs  Lab 06/10/22 1930 06/11/22 0546 07-12-2022 0413  AST 36 38  --   ALT 21 19  --   ALKPHOS 67 63  --   BILITOT 0.6 0.8  --   PROT 7.1 6.5  --   ALBUMIN 3.4* 3.0* 2.2*   No results for input(s): "LIPASE", "AMYLASE" in the last 168 hours. No results for input(s): "AMMONIA" in the last 168 hours. CBC: Recent Labs  Lab 06/10/22 1930 06/11/22 0420 06/11/22 0546 06/12/22 0340 06/13/22 0458 07-12-22 0413  WBC 13.2* 17.0* 14.4* 17.0* 16.4* 28.2*  NEUTROABS 11.6*  --  12.3*  --   --   --   HGB 10.2* 10.8* 10.1* 10.2* 10.4* 9.7*  HCT 31.1* 32.7* 30.8* 31.8* 33.0* 31.4*  MCV 85.0 83.8 85.3 86.9 88.7 89.2  PLT 212 223 190 282 317 330   Cardiac Enzymes: No results for input(s): "CKTOTAL", "CKMB",  "CKMBINDEX", "TROPONINI" in the last 168 hours. Sepsis Labs: Recent Labs  Lab 06/10/22 1930 06/10/22 2220 06/11/22 0420 06/11/22 0546 06/11/22 0722 06/12/22 0340 06/13/22 0458 Jul 12, 2022 0413  PROCALCITON 1.80  --   --  1.86  --  2.80 4.45  --   WBC 13.2*  --    < > 14.4*  --  17.0* 16.4* 28.2*  LATICACIDVEN 3.1* 1.7  --  1.1 1.0  --   --   --    < > = values in this interval not displayed.    Procedures/Operations  Left Internal Jugular CVL placement  Bronchoscopy Mechanical Intubation   Donell Beers, AGNP  Pulmonary/Critical Care Pager [phone] (please enter 7 digits) PCCM Consult Pager [phone] (please enter 7 digits)

## 2022-07-03 NOTE — IPAL (Signed)
  Interdisciplinary Goals of Care Family Meeting   Date carried out: 07/11/22  Location of the meeting: Conference room  Member's involved: Nurse Practitioner and Family Member or next of kin  Durable Power of Attorney or acting medical decision maker: Pts daughter Beryl Meager    Discussion: We discussed goals of care for [employer] .  Discussed pt kidney failure has worsened.  Mrs. Jerline Pain stated again her mother would NOT want any form of dialysis or aggressive measures.  Mrs. Jerline Pain is leaning towards transitioning pt to Comfort Measures Only soon.  Code status:   Code Status: DNR   Disposition: Continue current acute care will not escalate care   Time spent for the meeting: 15 minutes     Donell Beers, [place] Pager [phone] (please enter 7 digits) PCCM Consult Pager [phone] (please enter 7 digits)

## 2022-07-03 NOTE — Consult Note (Signed)
Jasmine Cummings for heparin drip Indication: chest pain/ACS  Allergies  Allergen Reactions   Latex Hives    REACTION: swelling Other reaction(s): Unknown   Other Other (See Comments)    [DERM]   Silicone     Other reaction(s): Other (See Comments) [DERM]    Patient Measurements: Height: '5\' 7"'$  (170.2 cm) Weight: 60 kg (132 lb 4.4 oz) IBW/kg (Calculated) : 61.6 Heparin Dosing Weight: 60 kg  Vital Signs: Temp: 98.9 F (37.2 C) (12/12 2000) Temp Source: Axillary (12/12 2000) BP: 89/53 (12/12 2330) Pulse Rate: 80 (12/12 2330)  Labs: Recent Labs    06/11/22 0546 06/12/22 0340 06/13/22 0458 06/13/22 1429 06/13/22 1635 06/13/22 2358  HGB 10.1* 10.2* 10.4*  --   --   --   HCT 30.8* 31.8* 33.0*  --   --   --   PLT 190 282 317  --   --   --   APTT  --   --   --   --  42*  --   LABPROT  --   --   --   --  18.4*  --   INR  --   --   --   --  1.5*  --   HEPARINUNFRC  --   --   --   --   --  0.33  CREATININE 2.28* 3.17* 4.11*  --   --   --   TROPONINIHS  --   --   --  46,503* 18,107*  --      Estimated Creatinine Clearance: 10.9 mL/min (A) (by C-G formula based on SCr of 4.11 mg/dL (H)).   Medical History: Past Medical History:  Diagnosis Date   Anemia    Anxiety    Arthritis    Chronic kidney disease    Depression    Diabetes mellitus    Diabetic retinopathy ([place])    GERD (gastroesophageal reflux disease)    Hypertension    Memory impairment    Obsessive compulsive disorder    Osteopenia 03/2018   T score -1.9 FRAX 9.7% / 2.1%   Thrombocytopenia ([place]) 05/31/2015    Medications:  No home anticoagulation per pharmacist review  Assessment: 77yo female presented with myalgias, chills, coughs, and nausea.  Was found to be RSV positive.  Developed worsening respiratory failure requiring intubation.  Now found to have significantly elevated troponin.  Baseline aPTT and PT/INR are pending.  Hgb = 10.4, plt = 317  Goal of  Therapy:  Heparin level 0.3-0.7 units/ml Monitor platelets by anticoagulation protocol: Yes   12/12 2358 HL 0.33, therapeutic x 1  Plan:  Continue heparin infusion at 700 units/hr Recheck HL w/ AM labs to confirm CBC daily while on heparin  Renda Rolls, PharmD, [facility] 07/10/2022 12:57 AM

## 2022-07-03 NOTE — IPAL (Signed)
  Interdisciplinary Goals of Care Family Meeting   Date carried out: 2022-07-06  Location of the meeting: Bedside  Member's involved: Nurse Practitioner, Bedside Registered Nurse, and Family Member or next of kin  Durable Power of Attorney or acting medical decision maker: Pts daughter Ulyses Southward   Discussion: We discussed goals of care for Jasmine Cummings .   Code status:   Code Status: DNR   Disposition: In-patient comfort care  Time spent for the meeting: 6 minutes     Donell Beers, [place] Pager [phone] (please enter 7 digits) [place] Pager [phone] (please enter 7 digits)

## 2022-07-03 NOTE — Plan of Care (Signed)
Patient is extubated to 2 lpm O2 nasal canula

## 2022-07-03 NOTE — Progress Notes (Signed)
Time of death 8. Pt pronounced by this RN and Jeannie Fend, RN. Family at bedside, MD, and honor bridge notified.

## 2022-07-03 NOTE — Inpatient Diabetes Management (Signed)
Inpatient Diabetes Program Recommendations  AACE/ADA: New Consensus Statement on Inpatient Glycemic Control (2015)  Target Ranges:  Prepandial:   less than 140 mg/dL      Peak postprandial:   less than 180 mg/dL (1-2 hours)      Critically ill patients:  140 - 180 mg/dL   Lab Results  Component Value Date   GLUCAP 308 (H) 2022/06/17   HGBA1C 7.3 (H) 06/08/2022    Review of Glycemic Control  Latest Reference Range & Units 06/13/22 07:25 06/13/22 11:16 06/13/22 15:29 06/13/22 19:18 2022-06-17 00:04 06-17-2022 04:12 Jun 17, 2022 07:43  Glucose-Capillary 70 - 99 mg/dL 203 (H) 177 (H) 213 (H) 274 (H) 291 (H) 310 (H) 308 (H)  (H): Data is abnormally high  Diabetes history: DM2 Outpatient Diabetes medications:  Novolog 12 units QID Lantus 9 units QAM Current orders for Inpatient glycemic control:  Novolog 0-15 units Q4H [place] 9 units QHS Vital 1.5 at 20 ml/hr with a goal of 50 ml/hr  Inpatient Diabetes Program Recommendations:    Glucose elevated likely with the initiation of tube feeds.  MD has increased correction to moderate Q4H.  If glucose remains elevated might consider:  Novolog 3 units Q4H tube feed coverage.  Stop if feeds are held or discontinued.    Will continue to follow while inpatient.  Thank you, Reche Dixon, MSN, [place] Diabetes Coordinator Inpatient Diabetes Program [phone] (team pager from 8a-5p)

## 2022-07-03 NOTE — Consult Note (Signed)
[place] for heparin drip Indication: chest pain/ACS  Allergies  Allergen Reactions   Latex Hives    REACTION: swelling Other reaction(s): Unknown   Other Other (See Comments)    [DERM]   Silicone     Other reaction(s): Other (See Comments) [DERM]    Patient Measurements: Height: '5\' 7"'$  (170.2 cm) Weight: 60.8 kg (134 lb 0.6 oz) IBW/kg (Calculated) : 61.6 Heparin Dosing Weight: 60 kg  Vital Signs: Temp: 98.9 F (37.2 C) (12/13 0400) Temp Source: Axillary (12/12 2000) BP: 90/60 (12/13 0530) Pulse Rate: 86 (12/13 0530)  Labs: Recent Labs    06/12/22 0340 06/13/22 0458 06/13/22 1429 06/13/22 1635 06/13/22 2358 06-29-2022 0413 Jun 29, 2022 0536  HGB 10.2* 10.4*  --   --   --  9.7*  --   HCT 31.8* 33.0*  --   --   --  31.4*  --   PLT 282 317  --   --   --  330  --   APTT  --   --   --  42*  --   --   --   LABPROT  --   --   --  18.4*  --   --   --   INR  --   --   --  1.5*  --   --   --   HEPARINUNFRC  --   --   --   --  0.33  --  0.19*  CREATININE 3.17* 4.11*  --   --   --  4.80*  --   TROPONINIHS  --   --  47,092* 18,107*  --   --   --      Estimated Creatinine Clearance: 9.4 mL/min (A) (by C-G formula based on SCr of 4.8 mg/dL (H)).   Medical History: Past Medical History:  Diagnosis Date   Anemia    Anxiety    Arthritis    Chronic kidney disease    Depression    Diabetes mellitus    Diabetic retinopathy ([place])    GERD (gastroesophageal reflux disease)    Hypertension    Memory impairment    Obsessive compulsive disorder    Osteopenia 03/2018   T score -1.9 FRAX 9.7% / 2.1%   Thrombocytopenia ([place]) 05/31/2015    Medications:  No home anticoagulation per pharmacist review  Assessment: 77yo female presented with myalgias, chills, coughs, and nausea.  Was found to be RSV positive.  Developed worsening respiratory failure requiring intubation.  Now found to have significantly elevated troponin.  Baseline aPTT and  PT/INR are pending.  Hgb = 10.4, plt = 317  Goal of Therapy:  Heparin level 0.3-0.7 units/ml Monitor platelets by anticoagulation protocol: Yes   12/12 2358 HL 0.33, therapeutic x 1 12/13 0536 HL 0.19, subtherapeutic  Plan:  Bolus 1800 units x 1 Increase heparin infusion to 900 units/hr Recheck HL in 8 hr after rate change CBC daily while on heparin  Renda Rolls, PharmD, [facility] 2022/06/29 6:15 AM

## 2022-07-03 NOTE — Progress Notes (Signed)
Rounding Note    Patient Name: Jasmine Cummings Date of Encounter: Jun 22, 2022  [facility] Cardiologist: None   Subjective   No acute events overnight.  Patient is still intubated, sedated, requiring pressors.  Inpatient Medications    Scheduled Meds:  albuterol  2.5 mg Nebulization BID   aspirin  81 mg Per Tube Daily   atorvastatin  20 mg Oral Daily   brimonidine  1 drop Right Eye BID   Chlorhexidine Gluconate Cloth  6 each Topical Daily   docusate  100 mg Per Tube BID   FLUoxetine  40 mg Oral q morning   free water  30 mL Per Tube Q4H   insulin aspart  0-15 Units Subcutaneous Q4H   insulin aspart  3 Units Subcutaneous Q4H   insulin glargine-yfgn  9 Units Subcutaneous QHS   multivitamin  1 tablet Per Tube QHS   mycophenolate  180 mg Oral QHS   mycophenolate  360 mg Oral Daily   mouth rinse  15 mL Mouth Rinse Q2H   pantoprazole (PROTONIX) IV  40 mg Intravenous Q24H   polyethylene glycol  17 g Per Tube Daily   sodium bicarbonate       tacrolimus  1 mg Oral QHS   tacrolimus  2 mg Oral Daily   Continuous Infusions:  sodium chloride     ceFEPime (MAXIPIME) IV Stopped (2022/06/22 0609)   feeding supplement (VITAL AF 1.2 CAL) 20 mL/hr at 22-Jun-2022 1315   fentaNYL infusion INTRAVENOUS 200 mcg/hr (Jun 22, 2022 1315)   heparin 900 Units/hr (06/22/22 1315)   lactated ringers Stopped (06/12/22 1054)   linezolid (ZYVOX) IV 300 mL/hr at Jun 22, 2022 1315   norepinephrine (LEVOPHED) Adult infusion 15 mcg/min (06/22/22 1315)   promethazine (PHENERGAN) injection (IM or IVPB) Stopped (06/10/22 1214)   propofol (DIPRIVAN) infusion Stopped (06/11/22 1505)   PRN Meds: acetaminophen **OR** acetaminophen, albuterol, guaiFENesin-dextromethorphan, hydrOXYzine, midazolam, ondansetron **OR** ondansetron (ZOFRAN) IV, mouth rinse, promethazine (PHENERGAN) injection (IM or IVPB), sodium bicarbonate   Vital Signs    Vitals:   06/22/2022 1000 June 22, 2022 1100 22-Jun-2022 1200 2022-06-22 1300  BP:  (!) 88/49 (!) 91/52 (!) 87/60 92/62  Pulse: 86 87 (!) 129 (!) 44  Resp: (!) 23 15 (!) 23 (!) 23  Temp:      TempSrc:      SpO2: 91% 90% 100% 96%  Weight:      Height:        Intake/Output Summary (Last 24 hours) at Jun 22, 2022 1406 Last data filed at 2022/06/22 1315 Gross per 24 hour  Intake 1487.92 ml  Output 355 ml  Net 1132.92 ml      06-22-22    5:00 AM 06/11/2022    5:00 AM 06/11/2022   12:06 PM  Last 3 Weights  Weight (lbs) 134 lb 0.6 oz 132 lb 4.4 oz 117 lb 1 oz  Weight (kg) 60.8 kg 60 kg 53.1 kg      Telemetry    Sinus rhythm- Personally Reviewed  ECG     - Personally Reviewed  Physical Exam   GEN: Intubated, sedated Neck: No JVD Cardiac: RRR Respiratory: Vented breath sounds GI: Soft, nontender, non-distended  MS: No edema; No deformity. Neuro: Unable to assess Psych: Unable to assess  Labs    High Sensitivity Troponin:   Recent Labs  Lab 06/13/22 1429 06/13/22 1635 06/22/22 0413  TROPONINIHS 15,725* 18,107* 13,495*     Chemistry Recent Labs  Lab 06/10/22 1930 06/11/22 0539 06/11/22 7673 06/12/22 0340 06/13/22 4193  06-19-22 0413  NA 130* 132* 129* 132* 131* 130*  K 4.0 4.0 4.1 4.1 4.6 4.9  CL 101 100 100 102 102 104  CO2 16* 16* 17* 19* 19* 16*  GLUCOSE 247* 242* 246* 150* 197* 321*  BUN 64* 65* 63* 68* 73* 85*  CREATININE 2.40* 2.34* 2.28* 3.17* 4.11* 4.80*  CALCIUM 8.4* 8.4* 8.1* 8.2* 8.1* 7.8*  MG 2.2 2.3  --  2.3 2.4  --   PROT 7.1  --  6.5  --   --   --   ALBUMIN 3.4*  --  3.0*  --   --  2.2*  AST 36  --  38  --   --   --   ALT 21  --  19  --   --   --   ALKPHOS 67  --  63  --   --   --   BILITOT 0.6  --  0.8  --   --   --   GFRNONAA 20* 21* 22* 15* 11* 9*  ANIONGAP 13 16* '12 11 10 10    '$ Lipids  Recent Labs  Lab 06/13/22 1635  CHOL 91  TRIG 108  HDL 43  LDLCALC 26  CHOLHDL 2.1    Hematology Recent Labs  Lab 06/12/22 0340 06/13/22 0458 06/19/2022 0413  WBC 17.0* 16.4* 28.2*  RBC 3.66* 3.72* 3.52*  HGB  10.2* 10.4* 9.7*  HCT 31.8* 33.0* 31.4*  MCV 86.9 88.7 89.2  MCH 27.9 28.0 27.6  MCHC 32.1 31.5 30.9  RDW 13.8 13.7 14.0  PLT 282 317 330   Thyroid No results for input(s): "TSH", "FREET4" in the last 168 hours.  BNPNo results for input(s): "BNP", "PROBNP" in the last 168 hours.  DDimer  Recent Labs  Lab 06/10/22 1930  DDIMER 1.72*     Radiology    DG Chest Port 1 View  Result Date: 06-19-2022 CLINICAL DATA:  Acute respiratory failure with hypoxia EXAM: PORTABLE CHEST - 1 VIEW COMPARISON:  06/12/2022 FINDINGS: Endotracheal tube tip approximately 3.5 cm above carina. Nasogastric tube extends at least as far stomach, tip not seen. Left IJ central venous catheter to the distal SVC. Perihilar and bibasilar interstitial airspace opacities or edema with appear marginally improved. Probable bilateral pleural effusions. Heart size upper limits normal. Aortic Atherosclerosis (ICD10-170.0). Visualized bones unremarkable. IMPRESSION: 1. Marginal improvement in perihilar and bibasilar edema/infiltrates. 2. Support hardware in expected position as above. Electronically Signed   By: Lucrezia Europe M.D.   On: 2022/06/19 06:37   ECHOCARDIOGRAM COMPLETE  Result Date: 06/13/2022    ECHOCARDIOGRAM REPORT   Patient Name:   Jasmine Cummings Date of Exam: 06/13/2022 Medical Rec #:  [id number]       Height:       67.0 in Accession #:    [id number]      Weight:       132.3 lb Date of Birth:  [birth year]      BSA:          1.696 m Patient Age:    77 years        BP:           103/60 mmHg Patient Gender: F               HR:           84 bpm. Exam Location:  [facility] Procedure: 2D Echo, Cardiac Doppler and Color Doppler Indications:     Shock  History:  Patient has prior history of Echocardiogram examinations, most                  recent 08/09/2021. Risk Factors:Hypertension. CKD.  Sonographer:     Sherrie Sport Referring Phys:  [id number] Bradly Bienenstock Diagnosing Phys: Nelva Bush MD  Sonographer Comments: Echo  performed with patient supine and on artificial respirator. Image quality was good. IMPRESSIONS  1. Left ventricular ejection fraction, by estimation, is 25 to 30%. The left ventricle has severely decreased function. The left ventricle demonstrates regional wall motion abnormalities (see scoring diagram/findings for description). There is mild asymmetric left ventricular hypertrophy of the basal-septal segment. Left ventricular diastolic parameters are consistent with Grade II diastolic dysfunction (pseudonormalization). Elevated left atrial pressure. There is severe hypokinesis of the left ventricular, mid-apical anteroseptal wall, anterior wall, anterolateral wall and apical segment.  2. Right ventricular systolic function is normal. The right ventricular size is normal.  3. Moderate pleural effusion in the left lateral region.  4. The mitral valve is degenerative. Moderate mitral valve regurgitation. No evidence of mitral stenosis.  5. Tricuspid valve regurgitation is moderate to severe.  6. The aortic valve has an indeterminant number of cusps. There is severe calcifcation of the aortic valve. There is severe thickening of the aortic valve. Aortic valve regurgitation is not visualized. Moderate to severe aortic valve stenosis. Aortic valve area, by VTI measures 0.79 cm. Aortic valve mean gradient measures 21.0 mmHg. FINDINGS  Left Ventricle: Left ventricular ejection fraction, by estimation, is 25 to 30%. The left ventricle has severely decreased function. The left ventricle demonstrates regional wall motion abnormalities. Severe hypokinesis of the left ventricular, mid-apical anteroseptal wall, anterior wall, anterolateral wall and apical segment. The left ventricular internal cavity size was normal in size. There is mild asymmetric left ventricular hypertrophy of the basal-septal segment. Left ventricular diastolic parameters are consistent with Grade II diastolic dysfunction (pseudonormalization). Elevated  left atrial pressure. Right Ventricle: The right ventricular size is normal. No increase in right ventricular wall thickness. Right ventricular systolic function is normal. Left Atrium: Left atrial size was normal in size. Right Atrium: Right atrial size was normal in size. Pericardium: There is no evidence of pericardial effusion. Mitral Valve: The mitral valve is degenerative in appearance. There is mild thickening of the mitral valve leaflet(s). Mild mitral annular calcification. Moderate mitral valve regurgitation. No evidence of mitral valve stenosis. Tricuspid Valve: The tricuspid valve is not well visualized. Tricuspid valve regurgitation is moderate to severe. Aortic Valve: The aortic valve has an indeterminant number of cusps. There is severe calcifcation of the aortic valve. There is severe thickening of the aortic valve. Aortic valve regurgitation is not visualized. Moderate to severe aortic stenosis is present. Aortic valve mean gradient measures 21.0 mmHg. Aortic valve peak gradient measures 31.7 mmHg. Aortic valve area, by VTI measures 0.79 cm. Pulmonic Valve: The pulmonic valve was not well visualized. Pulmonic valve regurgitation is trivial. No evidence of pulmonic stenosis. Aorta: The aortic root is normal in size and structure. Venous: IVC assessment for right atrial pressure unable to be performed due to mechanical ventilation. IAS/Shunts: No atrial level shunt detected by color flow Doppler. Additional Comments: There is a moderate pleural effusion in the left lateral region.  LEFT VENTRICLE PLAX 2D LVIDd:         4.30 cm      Diastology LVIDs:         3.10 cm      LV e' medial:    4.79  cm/s LV PW:         0.70 cm      LV E/e' medial:  29.2 LV IVS:        1.10 cm      LV e' lateral:   10.20 cm/s LVOT diam:     2.00 cm      LV E/e' lateral: 13.7 LV SV:         42 LV SV Index:   25 LVOT Area:     3.14 cm  LV Volumes (MOD) LV vol d, MOD A2C: 126.0 ml LV vol d, MOD A4C: 67.8 ml LV vol s, MOD A2C:  102.0 ml LV vol s, MOD A4C: 50.6 ml LV SV MOD A2C:     24.0 ml LV SV MOD A4C:     67.8 ml LV SV MOD BP:      19.0 ml RIGHT VENTRICLE RV Basal diam:  3.40 cm RV Mid diam:    3.20 cm RV S prime:     11.30 cm/s TAPSE (M-mode): 2.4 cm LEFT ATRIUM             Index        RIGHT ATRIUM           Index LA diam:        4.50 cm 2.65 cm/m   RA Area:     11.80 cm LA Vol (A2C):   26.2 ml 15.44 ml/m  RA Volume:   29.40 ml  17.33 ml/m LA Vol (A4C):   43.0 ml 25.35 ml/m LA Biplane Vol: 36.1 ml 21.28 ml/m  AORTIC VALVE AV Area (Vmax):    0.78 cm AV Area (Vmean):   0.74 cm AV Area (VTI):     0.79 cm AV Vmax:           281.67 cm/s AV Vmean:          206.000 cm/s AV VTI:            0.526 m AV Peak Grad:      31.7 mmHg AV Mean Grad:      21.0 mmHg LVOT Vmax:         70.30 cm/s LVOT Vmean:        48.500 cm/s LVOT VTI:          0.133 m LVOT/AV VTI ratio: 0.25  AORTA Ao Root diam: 3.30 cm MITRAL VALVE                TRICUSPID VALVE MV Area (PHT): 4.06 cm     TR Peak grad:   45.4 mmHg MV Decel Time: 187 msec     TR Vmax:        337.00 cm/s MV E velocity: 140.00 cm/s MV A velocity: 76.00 cm/s   SHUNTS MV E/A ratio:  1.84         Systemic VTI:  0.13 m                             Systemic Diam: 2.00 cm Nelva Bush MD Electronically signed by Nelva Bush MD Signature Date/Time: 06/13/2022/12:57:00 PM    Final (Updated)    DG Chest Port 1 View  Result Date: 06/12/2022 CLINICAL DATA:  [id number] Encounter for central line placement [id number] EXAM: PORTABLE CHEST 1 VIEW COMPARISON:  Chest x-ray 05/12/2022, CT chest 06/05/2022 FINDINGS: Endotracheal tube terminates 2 cm above the carina. Enteric tube courses below the hemidiaphragm with tip and side  port collimated off view. Left internal jugular central venous catheter with tip at the superior cavoatrial junction. The heart and mediastinal contours are unchanged. Aortic calcification. Bilateral, mid to lower lung zone predominant, patchy airspace opacities. No pulmonary edema.  Bilateral at least trace to small volume pleural effusions. No pneumothorax. No acute osseous abnormality. IMPRESSION: 1. Bilateral, mid to lower lung zone predominant, patchy airspace opacities. 2. Bilateral at least trace to small volume pleural effusions. 3. Lines and tubes as above. 4.  Aortic Atherosclerosis (ICD10-I70.0). Electronically Signed   By: Iven Finn M.D.   On: 06/12/2022 18:20    Cardiac Studies   TTE 06/13/2022 1. Left ventricular ejection fraction, by estimation, is 25 to 30%. The  left ventricle has severely decreased function. The left ventricle  demonstrates regional wall motion abnormalities (see scoring  diagram/findings for description). There is mild  asymmetric left ventricular hypertrophy of the basal-septal segment. Left  ventricular diastolic parameters are consistent with Grade II diastolic  dysfunction (pseudonormalization). Elevated left atrial pressure. There is  severe hypokinesis of the left  ventricular, mid-apical anteroseptal wall, anterior wall, anterolateral  wall and apical segment.   2. Right ventricular systolic function is normal. The right ventricular  size is normal.   3. Moderate pleural effusion in the left lateral region.   4. The mitral valve is degenerative. Moderate mitral valve regurgitation.  No evidence of mitral stenosis.   5. Tricuspid valve regurgitation is moderate to severe.   6. The aortic valve has an indeterminant number of cusps. There is severe  calcifcation of the aortic valve. There is severe thickening of the aortic  valve. Aortic valve regurgitation is not visualized. Moderate to severe  aortic valve stenosis. Aortic  valve area, by VTI measures 0.79 cm. Aortic valve mean gradient measures  21.0 mmHg.   Patient Profile     77 y.o. female with history of paroxysmal atrial fibrillation, diabetes, end-stage renal disease s/p renal transplant presenting with cough, nausea diagnosed with RSV infection, respiratory  failure, multifocal pneumonia requiring intubation.  Hospital course complicated by NSTEMI, cardiogenic shock, ejection fraction severely reduced EF 25 to 30%  Assessment & Plan    HFrEF, cardiogenic shock, EF 30% -Continue supportive care per ICU -Pressors to keep MAP over 60  2.  NSTEMI -Continue heparin x 48 hours,  -okay to continue after 48 hours if H&H permits due to history of paroxysmal A-fib -Left heart cath being avoided due to risk of worsening renal dysfunction, history of renal transplant.  3.  Moderate to severe aortic stenosis -Management pending recovery from acute illness.  4.  Respiratory failure, RSV infection, acute Cummings injury -Management as per primary team -Avoid nephrotoxic's  Overall prognosis remain guarded.  Patient is critically ill.  Total encounter time more than 50 minutes  Greater than 50% was spent in counseling and coordination of care with the patient        Signed, Kate Sable, MD  June 23, 2022, 2:06 PM

## 2022-07-03 DEATH — deceased

## 2022-07-09 LAB — FUNGAL ORGANISM REFLEX

## 2022-07-09 LAB — FUNGUS CULTURE WITH STAIN

## 2022-07-09 LAB — FUNGUS CULTURE RESULT

## 2022-07-28 LAB — ACID FAST CULTURE WITH REFLEXED SENSITIVITIES (MYCOBACTERIA): Acid Fast Culture: NEGATIVE

## 2022-11-15 ENCOUNTER — Ambulatory Visit: Payer: Medicare Other | Admitting: Psychiatry

## 2022-11-15 ENCOUNTER — Ambulatory Visit: Payer: Medicare Other | Admitting: Family Medicine
